# Patient Record
Sex: Male | Born: 1960 | Race: Asian | Hispanic: No | Marital: Married | Smoking: Never smoker
Health system: Southern US, Community
[De-identification: ages and names within clinical notes are randomized; demographics above are authoritative.]

## PROBLEM LIST (undated history)

## (undated) DIAGNOSIS — E785 Hyperlipidemia, unspecified: Secondary | ICD-10-CM

## (undated) DIAGNOSIS — I861 Scrotal varices: Secondary | ICD-10-CM

## (undated) HISTORY — DX: Scrotal varices: I86.1

## (undated) HISTORY — PX: ESOPHAGOGASTRODUODENOSCOPY: SHX1529

## (undated) HISTORY — PX: BACK SURGERY: SHX140

## (undated) HISTORY — DX: Hyperlipidemia, unspecified: E78.5

---

## 2000-02-27 ENCOUNTER — Ambulatory Visit (HOSPITAL_COMMUNITY): Admission: RE | Admit: 2000-02-27 | Discharge: 2000-02-27 | Payer: Self-pay | Admitting: Gastroenterology

## 2000-04-06 ENCOUNTER — Ambulatory Visit (HOSPITAL_COMMUNITY): Admission: RE | Admit: 2000-04-06 | Discharge: 2000-04-06 | Payer: Self-pay | Admitting: Gastroenterology

## 2000-04-06 ENCOUNTER — Encounter: Payer: Self-pay | Admitting: Gastroenterology

## 2000-04-29 ENCOUNTER — Ambulatory Visit (HOSPITAL_COMMUNITY): Admission: RE | Admit: 2000-04-29 | Discharge: 2000-04-29 | Payer: Self-pay | Admitting: Gastroenterology

## 2000-04-29 ENCOUNTER — Encounter: Payer: Self-pay | Admitting: Gastroenterology

## 2003-02-20 ENCOUNTER — Ambulatory Visit (HOSPITAL_COMMUNITY): Admission: RE | Admit: 2003-02-20 | Discharge: 2003-02-20 | Payer: Self-pay | Admitting: Gastroenterology

## 2003-03-01 ENCOUNTER — Ambulatory Visit (HOSPITAL_COMMUNITY): Admission: RE | Admit: 2003-03-01 | Discharge: 2003-03-01 | Payer: Self-pay | Admitting: Pulmonary Disease

## 2003-05-10 ENCOUNTER — Ambulatory Visit: Admission: RE | Admit: 2003-05-10 | Discharge: 2003-05-10 | Payer: Self-pay | Admitting: Pulmonary Disease

## 2003-05-10 ENCOUNTER — Encounter (INDEPENDENT_AMBULATORY_CARE_PROVIDER_SITE_OTHER): Payer: Self-pay | Admitting: Specialist

## 2004-01-16 ENCOUNTER — Ambulatory Visit: Payer: Self-pay | Admitting: Internal Medicine

## 2004-08-26 ENCOUNTER — Ambulatory Visit: Payer: Self-pay | Admitting: Internal Medicine

## 2004-08-26 ENCOUNTER — Ambulatory Visit (HOSPITAL_COMMUNITY): Admission: RE | Admit: 2004-08-26 | Discharge: 2004-08-26 | Payer: Self-pay | Admitting: Internal Medicine

## 2005-08-25 ENCOUNTER — Ambulatory Visit: Payer: Self-pay | Admitting: Internal Medicine

## 2005-09-03 ENCOUNTER — Ambulatory Visit: Payer: Self-pay | Admitting: Internal Medicine

## 2005-09-24 ENCOUNTER — Ambulatory Visit: Payer: Self-pay | Admitting: Gastroenterology

## 2005-09-29 ENCOUNTER — Ambulatory Visit: Payer: Self-pay | Admitting: Gastroenterology

## 2005-11-11 ENCOUNTER — Ambulatory Visit: Payer: Self-pay | Admitting: Internal Medicine

## 2006-12-17 ENCOUNTER — Ambulatory Visit: Payer: Self-pay | Admitting: Internal Medicine

## 2006-12-17 LAB — CONVERTED CEMR LAB
ALT: 24 units/L (ref 0–53)
AST: 20 units/L (ref 0–37)
Albumin: 4.4 g/dL (ref 3.5–5.2)
Alkaline Phosphatase: 52 units/L (ref 39–117)
BUN: 6 mg/dL (ref 6–23)
Basophils Absolute: 0 10*3/uL (ref 0.0–0.1)
Basophils Relative: 0.4 % (ref 0.0–1.0)
Bilirubin, Direct: 0.1 mg/dL (ref 0.0–0.3)
CO2: 32 meq/L (ref 19–32)
Calcium: 9.3 mg/dL (ref 8.4–10.5)
Chloride: 104 meq/L (ref 96–112)
Cholesterol: 202 mg/dL (ref 0–200)
Creatinine, Ser: 0.7 mg/dL (ref 0.4–1.5)
Direct LDL: 118.1 mg/dL
Eosinophils Absolute: 0 10*3/uL (ref 0.0–0.6)
Eosinophils Relative: 0.5 % (ref 0.0–5.0)
GFR calc Af Amer: 156 mL/min
GFR calc non Af Amer: 129 mL/min
Glucose, Bld: 120 mg/dL — ABNORMAL HIGH (ref 70–99)
HCT: 42.2 % (ref 39.0–52.0)
HDL: 62.2 mg/dL (ref >39.0)
Hemoglobin: 14.7 g/dL (ref 13.0–17.0)
Lymphocytes Relative: 28.1 % (ref 12.0–46.0)
MCHC: 34.9 g/dL (ref 30.0–36.0)
MCV: 93.1 fL (ref 78.0–100.0)
Monocytes Absolute: 0.3 10*3/uL (ref 0.2–0.7)
Monocytes Relative: 6.4 % (ref 3.0–11.0)
Neutro Abs: 2.6 10*3/uL (ref 1.4–7.7)
Neutrophils Relative %: 64.6 % (ref 43.0–77.0)
Platelets: 202 10*3/uL (ref 150–400)
Potassium: 4.2 meq/L (ref 3.5–5.1)
RBC: 4.53 M/uL (ref 4.22–5.81)
RDW: 13.2 % (ref 11.5–14.6)
Sodium: 141 meq/L (ref 135–145)
TSH: 1.44 microintl units/mL (ref 0.35–5.50)
Total Bilirubin: 1.1 mg/dL (ref 0.3–1.2)
Total CHOL/HDL Ratio: 3.2
Total Protein: 7 g/dL (ref 6.0–8.3)
Triglycerides: 94 mg/dL (ref 0–149)
VLDL: 19 mg/dL (ref 0–40)
WBC: 4 10*3/uL — ABNORMAL LOW (ref 4.5–10.5)

## 2006-12-24 ENCOUNTER — Ambulatory Visit: Payer: Self-pay | Admitting: Internal Medicine

## 2006-12-24 DIAGNOSIS — K219 Gastro-esophageal reflux disease without esophagitis: Secondary | ICD-10-CM

## 2006-12-24 DIAGNOSIS — D72819 Decreased white blood cell count, unspecified: Secondary | ICD-10-CM | POA: Insufficient documentation

## 2006-12-24 DIAGNOSIS — E785 Hyperlipidemia, unspecified: Secondary | ICD-10-CM

## 2006-12-24 DIAGNOSIS — I861 Scrotal varices: Secondary | ICD-10-CM | POA: Insufficient documentation

## 2006-12-24 DIAGNOSIS — J309 Allergic rhinitis, unspecified: Secondary | ICD-10-CM | POA: Insufficient documentation

## 2007-06-30 ENCOUNTER — Ambulatory Visit: Payer: Self-pay | Admitting: Internal Medicine

## 2007-06-30 DIAGNOSIS — J069 Acute upper respiratory infection, unspecified: Secondary | ICD-10-CM | POA: Insufficient documentation

## 2007-12-01 ENCOUNTER — Encounter: Payer: Self-pay | Admitting: Internal Medicine

## 2007-12-20 ENCOUNTER — Ambulatory Visit: Payer: Self-pay | Admitting: Internal Medicine

## 2007-12-20 LAB — CONVERTED CEMR LAB
ALT: 22 units/L (ref 0–53)
AST: 22 units/L (ref 0–37)
Albumin: 4.3 g/dL (ref 3.5–5.2)
Alkaline Phosphatase: 52 units/L (ref 39–117)
BUN: 9 mg/dL (ref 6–23)
Basophils Absolute: 0 10*3/uL (ref 0.0–0.1)
Basophils Relative: 0 % (ref 0.0–3.0)
Bilirubin Urine: NEGATIVE
Bilirubin, Direct: 0.1 mg/dL (ref 0.0–0.3)
CO2: 28 meq/L (ref 19–32)
CRP, High Sensitivity: <1 — ABNORMAL LOW (ref 0.00–5.00)
Calcium: 9.1 mg/dL (ref 8.4–10.5)
Chloride: 106 meq/L (ref 96–112)
Cholesterol: 258 mg/dL (ref 0–200)
Creatinine, Ser: 0.6 mg/dL (ref 0.4–1.5)
Direct LDL: 149.1 mg/dL
Eosinophils Absolute: 0 10*3/uL (ref 0.0–0.7)
Eosinophils Relative: 1 % (ref 0.0–5.0)
GFR calc Af Amer: 186 mL/min
GFR calc non Af Amer: 153 mL/min
Glucose, Bld: 98 mg/dL (ref 70–99)
HCT: 44.8 % (ref 39.0–52.0)
HDL: 50.1 mg/dL (ref >39.0)
Hemoglobin, Urine: NEGATIVE
Hemoglobin: 15.7 g/dL (ref 13.0–17.0)
Ketones, ur: NEGATIVE mg/dL
Leukocytes, UA: NEGATIVE
Lymphocytes Relative: 34.5 % (ref 12.0–46.0)
MCHC: 34.9 g/dL (ref 30.0–36.0)
MCV: 93.9 fL (ref 78.0–100.0)
Monocytes Absolute: 0.3 10*3/uL (ref 0.1–1.0)
Monocytes Relative: 7.3 % (ref 3.0–12.0)
Neutro Abs: 2.3 10*3/uL (ref 1.4–7.7)
Neutrophils Relative %: 57.2 % (ref 43.0–77.0)
Nitrite: NEGATIVE
Platelets: 196 10*3/uL (ref 150–400)
Potassium: 4 meq/L (ref 3.5–5.1)
RBC: 4.77 M/uL (ref 4.22–5.81)
RDW: 12.9 % (ref 11.5–14.6)
Sodium: 140 meq/L (ref 135–145)
Specific Gravity, Urine: 1.015 (ref 1.000–1.03)
TSH: 2.99 microintl units/mL (ref 0.35–5.50)
Total Bilirubin: 1.1 mg/dL (ref 0.3–1.2)
Total CHOL/HDL Ratio: 5.1
Total CK: 150 units/L (ref 7–195)
Total Protein, Urine: NEGATIVE mg/dL
Total Protein: 7.4 g/dL (ref 6.0–8.3)
Triglycerides: 147 mg/dL (ref 0–149)
Urine Glucose: NEGATIVE mg/dL
Urobilinogen, UA: 0.2 (ref 0.0–1.0)
VLDL: 29 mg/dL (ref 0–40)
WBC: 4 10*3/uL — ABNORMAL LOW (ref 4.5–10.5)
pH: 7 (ref 5.0–8.0)

## 2007-12-27 ENCOUNTER — Ambulatory Visit: Payer: Self-pay | Admitting: Internal Medicine

## 2007-12-27 DIAGNOSIS — R209 Unspecified disturbances of skin sensation: Secondary | ICD-10-CM

## 2010-02-19 ENCOUNTER — Telehealth: Payer: Self-pay | Admitting: Internal Medicine

## 2010-02-20 ENCOUNTER — Encounter: Payer: Self-pay | Admitting: Internal Medicine

## 2010-02-20 LAB — CONVERTED CEMR LAB
BUN: 10 mg/dL (ref 6–23)
Chloride: 102 meq/L (ref 96–112)
Glucose, Bld: 102 mg/dL — ABNORMAL HIGH (ref 70–99)
LDL Cholesterol: 165 mg/dL — ABNORMAL HIGH (ref 0–99)
Potassium: 4.2 meq/L (ref 3.5–5.3)
Triglycerides: 163 mg/dL — ABNORMAL HIGH (ref <150)
VLDL: 33 mg/dL (ref 0–40)

## 2010-02-21 ENCOUNTER — Encounter: Payer: Self-pay | Admitting: Internal Medicine

## 2010-02-21 ENCOUNTER — Ambulatory Visit
Admission: RE | Admit: 2010-02-21 | Discharge: 2010-02-21 | Payer: Self-pay | Source: Home / Self Care | Attending: Internal Medicine | Admitting: Internal Medicine

## 2010-02-24 ENCOUNTER — Telehealth: Payer: Self-pay | Admitting: Internal Medicine

## 2010-03-07 NOTE — Progress Notes (Signed)
Summary: Lab Order  Phone Note Call from Patient   Caller: Patient Details for Reason: Lab order Summary of Call: Pt scheduled for CPX   thursday Jan 19,coming for Lab  tomorrow Jan 18  need orders Initial call taken by: Darral Dash,  February 19, 2010 12:40 PM  Follow-up for Phone Call        FLP, High sensitivity CRP :  272.4 BMET:  272.4  Follow-up by: D. Thomos Lemons DO,  February 19, 2010 1:11 PM  Additional Follow-up for Phone Call Additional follow up Details #1::        Lab order entered for [facility] Additional Follow-up by: Glendell Docker CMA,  February 19, 2010 3:04 PM

## 2010-03-07 NOTE — Progress Notes (Signed)
Summary: Lab Results  Phone Note Outgoing Call   Summary of Call: call pt - H. Pylori antibody negative Initial call taken by: D. Thomos Lemons DO,  February 24, 2010 11:23 PM  Follow-up for Phone Call        call placed to patient at [phone], he has been informed per Dr Artist Pais instructions Follow-up by: Glendell Docker CMA,  February 25, 2010 12:43 PM

## 2010-03-13 NOTE — Assessment & Plan Note (Signed)
Summary: CPX/HEA   Vital Signs:  Patient profile:   50 year old male Height:      67 inches Weight:      152.50 pounds BMI:     23.97 O2 Sat:      100 % on Room air Temp:     97.6 degrees F oral Pulse rate:   56 / minute Resp:     18 per minute BP sitting:   110 / 70  (left arm) Cuff size:   regular  Vitals Entered By: Glendell Docker CMA (February 21, 2010 8:34 AM)  O2 Flow:  Room air  Primary Care Provider:  D. Thomos Lemons DO   History of Present Illness: 50 y/o [place] male for follow up and for routine cpx no sign int hx overall doing well  still worries about fam hx of pancreatic cancer no GI symptoms   Preventive Screening-Counseling & Management  Alcohol-Tobacco     Alcohol drinks/day: 0     Smoking Status: never  Caffeine-Diet-Exercise     Caffeine use/day: 2-3 times per week     Does Patient Exercise: no  Allergies (verified): No Known Drug Allergies  Past History:  Past Medical History: Hyperlipidemia Allergic rhinitis History of Left Varicocele  Bronchiectasis    Past Surgical History: Back surgery EGD - 2002, 2007    Family History: Family history of gastric cancer and pancreatic cancer.  family hx of stroke family hx of rectal cancer - Aunt   Social History: Occupation: Self employed Married Never Smoked  Alcohol use-no   Caffeine use/day:  2-3 times per week Does Patient Exercise:  no  Review of Systems  The patient denies anorexia, weight loss, weight gain, chest pain, syncope, dyspnea on exertion, abdominal pain, melena, hematochezia, severe indigestion/heartburn, and depression.         intermittent right sided neck pain  Physical Exam  General:  alert, well-developed, and well-nourished.   Head:  normocephalic and atraumatic.   Ears:  R ear normal and L ear normal.   Mouth:  pharynx pink and moist.   Neck:  No deformities, masses, or tenderness noted. Lungs:  normal respiratory effort and normal breath sounds.   Heart:   normal rate, regular rhythm, and no gallop.   Abdomen:  soft, non-tender, and normal bowel sounds.   Extremities:  No lower extremity edema Neurologic:  cranial nerves II-XII intact and gait normal.   Psych:  normally interactive, good eye contact, not anxious appearing, and not depressed appearing.     Impression & Recommendations:  Problem # 1:  ROUTINE GENERAL MEDICAL EXAM@HEALTH  CARE FACL (ICD-V70.0) Reviewed adult health maintenance protocols.  Orders: EKG w/ Interpretation (93000)  Td Booster: given (09/25/2006)   Flu Vax: Historical (11/13/2009)   Chol: 258 (12/20/2007)   HDL: 50.1 (12/20/2007)   LDL: DEL (12/20/2007)   TG: 147 (12/20/2007) TSH: 2.99 (12/20/2007)     Problem # 2:  HYPERLIPIDEMIA (ICD-272.4)  His updated medication list for this problem includes:    Lipitor 10 Mg Tabs (Atorvastatin calcium) ..... One by mouth once daily  Labs Reviewed: SGOT: 22 (12/20/2007)   SGPT: 22 (12/20/2007)   HDL:50.1 (12/20/2007), 62.2 (12/17/2006)  LDL:DEL (12/20/2007), DEL (12/17/2006)  Chol:258 (12/20/2007), 202 (12/17/2006)  Trig:147 (12/20/2007), 94 (12/17/2006)  Complete Medication List: 1)  Lipitor 10 Mg Tabs (Atorvastatin calcium) .... One by mouth once daily  Patient Instructions: 1)  Please schedule a follow-up appointment in 3 months. 2)  Hepatic Panel prior to visit, ICD-9:  272.4 3)  Lipid Panel prior to visit, ICD-9: 272.4 4)  Please return for lab work one (1) week before your next appointment.  Prescriptions: LIPITOR 10 MG TABS (ATORVASTATIN CALCIUM) one by mouth once daily  #90 x 1   Entered and Authorized by:   D. Thomos Lemons DO   Signed by:   D. Thomos Lemons DO on 02/21/2010   Method used:   Faxed to ...       [facility] (mail-order)       [id number]       [place], [place]  [id number]       Ph:        Fax: [phone]   RxID:   [id number]    Orders Added: 1)  EKG w/ Interpretation [93000] 2)  Est. Patient 50-64 years [96295]   Immunization  History:  Influenza Immunization History:    Influenza:  historical (11/13/2009)   Immunization History:  Influenza Immunization History:    Influenza:  Historical (11/13/2009)   Current Allergies (reviewed today): No known allergies

## 2010-05-14 ENCOUNTER — Other Ambulatory Visit: Payer: Self-pay | Admitting: Internal Medicine

## 2010-05-14 DIAGNOSIS — E785 Hyperlipidemia, unspecified: Secondary | ICD-10-CM

## 2010-05-14 LAB — LIPID PANEL
Cholesterol: 173 mg/dL (ref 0–200)
LDL Cholesterol: 93 mg/dL (ref 0–99)
VLDL: 33 mg/dL (ref 0–40)

## 2010-05-15 ENCOUNTER — Encounter: Payer: Self-pay | Admitting: Internal Medicine

## 2010-05-21 ENCOUNTER — Ambulatory Visit (INDEPENDENT_AMBULATORY_CARE_PROVIDER_SITE_OTHER): Payer: BC Managed Care – PPO | Admitting: Internal Medicine

## 2010-05-21 ENCOUNTER — Encounter: Payer: Self-pay | Admitting: Internal Medicine

## 2010-05-21 VITALS — BP 90/60 | HR 49 | Temp 97.7°F | Resp 16 | Ht 67.0 in | Wt 150.0 lb

## 2010-05-21 DIAGNOSIS — E785 Hyperlipidemia, unspecified: Secondary | ICD-10-CM

## 2010-05-21 LAB — HEPATIC FUNCTION PANEL
Albumin: 4.5 g/dL (ref 3.5–5.2)
Total Bilirubin: 0.5 mg/dL (ref 0.3–1.2)
Total Protein: 6.5 g/dL (ref 6.0–8.3)

## 2010-05-21 MED ORDER — ATORVASTATIN CALCIUM 10 MG PO TABS: 10.0000 mg | ORAL_TABLET | Freq: Every day | ORAL | Status: DC

## 2010-05-21 NOTE — Progress Notes (Signed)
  Subjective:    Patient ID: Cristian Fuller, male    DOB: 1960-10-15, 50 y.o.   MRN: [id number]  Hyperlipidemia This is a chronic problem. The problem is controlled. Recent lipid tests were reviewed and are normal. He has no history of diabetes, hypothyroidism or obesity. Pertinent negatives include no chest pain. Current antihyperlipidemic treatment includes statins. The current treatment provides moderate improvement of lipids. There are no compliance problems.  Risk factors for coronary artery disease include male sex.      Review of Systems  Cardiovascular: Negative for chest pain.      Review of Systems  Cardiovascular: Negative for chest pain.   Past Medical History  Diagnosis Date  . Hyperlipidemia   . Allergic rhinitis   . Left varicocele     history of left varicocele  . Bronchiectasis     History   Social History  . Marital Status: Married    Spouse Name: N/A    Number of Children: N/A  . Years of Education: N/A   Occupational History  . Not on file.   Social History Main Topics  . Smoking status: Never Smoker   . Smokeless tobacco: Not on file  . Alcohol Use: No  . Drug Use: Not on file  . Sexually Active: Not on file   Other Topics Concern  . Not on file   Social History Narrative   Occupation: Office manager Smoked Alcohol use-no  Caffeine use/day:  2-3 times per weekDoes Patient Exercise:  no    Past Surgical History  Procedure Date  . Back surgery   . Esophagogastroduodenoscopy 2002/2007    Family History  Problem Relation Age of Onset  . Pancreatic cancer    . Cancer      gastric cancer  . Stroke    . Rectal cancer      aunt    No Known Allergies  Current Outpatient Prescriptions on File Prior to Visit  Medication Sig Dispense Refill  . atorvastatin (LIPITOR) 10 MG tablet Take 10 mg by mouth daily.          BP 90/60  Pulse 49  Temp(Src) 97.7 F (36.5 C) (Oral)  Resp 16  Ht 5\' 7"  (1.702 m)  Wt 150 lb (68.04 kg)   BMI 23.49 kg/m2  SpO2 100%     Objective:   Physical Exam  Constitutional: He appears well-developed and well-nourished. No distress.  Neck:       No carotid bruit  Cardiovascular: Normal rate, regular rhythm and normal heart sounds.   Pulmonary/Chest: Effort normal and breath sounds normal. He has no wheezes. He has no rales.          Assessment & Plan:

## 2010-05-26 ENCOUNTER — Encounter: Payer: Self-pay | Admitting: Internal Medicine

## 2010-05-26 NOTE — Assessment & Plan Note (Signed)
Improved with atorvastatin.  Continue current medication regimen.   Lab Results  Component Value Date   CHOL 173 05/14/2010   CHOL 248* 02/20/2010   CHOL 258* 12/20/2007   Lab Results  Component Value Date   HDL 47 05/14/2010   HDL 50 02/22/1476   HDL 50.1 12/20/2007   Lab Results  Component Value Date   LDLCALC 93 05/14/2010   LDLCALC 165* 02/20/2010   Lab Results  Component Value Date   TRIG 166* 05/14/2010   TRIG 163* 02/20/2010   TRIG 147 12/20/2007   Lab Results  Component Value Date   CHOLHDL 3.7 05/14/2010   CHOLHDL 5.0 Ratio 02/20/2010   CHOLHDL 5.1 CALC 12/20/2007

## 2010-05-26 NOTE — Patient Instructions (Signed)
Please complete the following lab tests before your next follow up appointment: FLP, LFTs - 272.4 

## 2010-06-21 NOTE — Assessment & Plan Note (Signed)
[facility]                           GASTROENTEROLOGY OFFICE NOTE   NAME:Masi, ERIQUE KASER                        MRN:          [id number]  DATE:09/24/2005                            DOB:          17-Dec-1960    PREVIOUS GASTROENTEROLOGIST:  Dr. Victorino Dike   GI PROBLEM LIST:  1. Gastroesophageal reflux disease symptoms. Status post EGD in 2002 by      Dr. Victorino Dike which described esophagitis, likely reflux related.  2. Strong family history for stomach cancer including father, an uncle and      a nephew.   INTERVAL HISTORY:  Mr. Uhrich was last seen here by Dr. Corinda Gubler approximately  2 years ago. Since then he continues to have intermittent burning in his  esophagus when eating. He is not taking any PPIs as was suggested in the  past. He is also very concerned about his strong family history for gastric  cancer.   REVIEW OF SYSTEMS:  Notable for stable weight, no dysphagia, and  intermittent pyrosis.   CURRENT MEDICATIONS:  Zetia, fluticasone.   PHYSICAL EXAMINATION:  VITAL SIGNS:  Height 5 feet, 7 inches, weight 140  pounds. Blood pressure 102/58, pulse 56.  CONSTITUTIONAL:  Generally well-appearing.  NEUROLOGIC:  Alert and oriented x3.  LUNGS:  Clear to auscultation bilaterally.  HEART:  Regular rate and rhythm.  ABDOMEN:  Soft, nontender, nondistended. Normal bowel sounds.   ASSESSMENT AND PLAN:  50 year old man with strong family history for gastric  cancer, intermittent GERD symptoms, known esophagitis.   I think we should repeat endoscopy given his very strong family history for  gastric cancer. We will arrange for that to be done as soon as convenient.  I reiterated the fact that he probably should be on a daily proton pump  inhibitor and wrote a prescription for Protonix to be taken 40 mg 20 to 30  minutes prior to his breakfast meal.  I see no reason for any further blood  tests or imaging studies prior to then.                    Rachael Fee, MD   DPJ/MedQ  DD:  09/24/2005  DT:  09/24/2005  Job #:  [id number]   cc:   Barbette Hair. Artist Pais, DO

## 2010-10-26 ENCOUNTER — Other Ambulatory Visit: Payer: Self-pay | Admitting: Internal Medicine

## 2010-11-18 ENCOUNTER — Telehealth: Payer: Self-pay | Admitting: Internal Medicine

## 2010-11-18 NOTE — Telephone Encounter (Signed)
Schedule cpx labs 1 wk prior

## 2010-11-18 NOTE — Telephone Encounter (Signed)
Patient has cpe with Dr. Artist Pais on 05/21/11. Please order labs prior to visit.

## 2011-03-18 ENCOUNTER — Telehealth: Payer: Self-pay | Admitting: Internal Medicine

## 2011-03-18 DIAGNOSIS — Z0000 Encounter for general adult medical examination without abnormal findings: Secondary | ICD-10-CM

## 2011-03-18 NOTE — Telephone Encounter (Signed)
Pt called and has sch his cpx for 04/07/11 and is req to get a colonoscopy ordered and sched prior to his cpx, because pts insurance is getting ready to Terminate. Pt is losing his insurance in next month or so. Pls advise.

## 2011-03-19 NOTE — Telephone Encounter (Signed)
Order placed for colonoscopy

## 2011-03-19 NOTE — Telephone Encounter (Signed)
pls place order for screening colonoscopy

## 2011-03-24 ENCOUNTER — Other Ambulatory Visit (INDEPENDENT_AMBULATORY_CARE_PROVIDER_SITE_OTHER): Payer: BC Managed Care – PPO

## 2011-03-24 DIAGNOSIS — E785 Hyperlipidemia, unspecified: Secondary | ICD-10-CM

## 2011-03-24 DIAGNOSIS — Z0000 Encounter for general adult medical examination without abnormal findings: Secondary | ICD-10-CM

## 2011-03-24 LAB — CBC WITH DIFFERENTIAL/PLATELET
Basophils Absolute: 0 K/uL (ref 0.0–0.1)
Basophils Relative: 0.7 % (ref 0.0–3.0)
Eosinophils Absolute: 0.1 K/uL (ref 0.0–0.7)
Eosinophils Relative: 1.4 % (ref 0.0–5.0)
HCT: 44.6 % (ref 39.0–52.0)
Hemoglobin: 15.2 g/dL (ref 13.0–17.0)
Lymphocytes Relative: 31.8 % (ref 12.0–46.0)
Lymphs Abs: 1.2 K/uL (ref 0.7–4.0)
MCHC: 34.2 g/dL (ref 30.0–36.0)
MCV: 91.9 fl (ref 78.0–100.0)
Monocytes Absolute: 0.4 K/uL (ref 0.1–1.0)
Monocytes Relative: 11.4 % (ref 3.0–12.0)
Neutro Abs: 2.1 K/uL (ref 1.4–7.7)
Neutrophils Relative %: 54.7 % (ref 43.0–77.0)
Platelets: 193 K/uL (ref 150.0–400.0)
RBC: 4.85 Mil/uL (ref 4.22–5.81)
RDW: 13 % (ref 11.5–14.6)
WBC: 3.8 K/uL — ABNORMAL LOW (ref 4.5–10.5)

## 2011-03-24 LAB — POCT URINALYSIS DIPSTICK
Bilirubin, UA: NEGATIVE
Glucose, UA: NEGATIVE
Leukocytes, UA: NEGATIVE
Nitrite, UA: NEGATIVE
Urobilinogen, UA: 0.2
pH, UA: 7

## 2011-03-24 LAB — LIPID PANEL
Cholesterol: 328 mg/dL — ABNORMAL HIGH (ref 0–200)
HDL: 49.8 mg/dL
Total CHOL/HDL Ratio: 7
Triglycerides: 147 mg/dL (ref 0.0–149.0)
VLDL: 29.4 mg/dL (ref 0.0–40.0)

## 2011-03-24 LAB — BASIC METABOLIC PANEL WITH GFR
BUN: 13 mg/dL (ref 6–23)
CO2: 27 meq/L (ref 19–32)
Calcium: 9.1 mg/dL (ref 8.4–10.5)
Chloride: 104 meq/L (ref 96–112)
Creatinine, Ser: 0.7 mg/dL (ref 0.4–1.5)
GFR: 118.53 mL/min
Glucose, Bld: 99 mg/dL (ref 70–99)
Potassium: 4 meq/L (ref 3.5–5.1)
Sodium: 139 meq/L (ref 135–145)

## 2011-03-24 LAB — TSH: TSH: 2.64 u[IU]/mL (ref 0.35–5.50)

## 2011-03-24 LAB — PSA: PSA: 1.08 ng/mL (ref 0.10–4.00)

## 2011-03-24 LAB — LDL CHOLESTEROL, DIRECT: Direct LDL: 230.3 mg/dL

## 2011-03-24 LAB — HEPATIC FUNCTION PANEL
Albumin: 4.4 g/dL (ref 3.5–5.2)
Alkaline Phosphatase: 51 U/L (ref 39–117)

## 2011-04-07 ENCOUNTER — Ambulatory Visit (INDEPENDENT_AMBULATORY_CARE_PROVIDER_SITE_OTHER): Payer: BC Managed Care – PPO | Admitting: Internal Medicine

## 2011-04-07 ENCOUNTER — Encounter: Payer: Self-pay | Admitting: Internal Medicine

## 2011-04-07 DIAGNOSIS — Z800 Family history of malignant neoplasm of digestive organs: Secondary | ICD-10-CM

## 2011-04-07 DIAGNOSIS — E785 Hyperlipidemia, unspecified: Secondary | ICD-10-CM

## 2011-04-07 DIAGNOSIS — Z0000 Encounter for general adult medical examination without abnormal findings: Secondary | ICD-10-CM

## 2011-04-07 MED ORDER — SIMVASTATIN 40 MG PO TABS: 40.0000 mg | ORAL_TABLET | Freq: Every day | ORAL | Status: DC

## 2011-04-07 NOTE — Patient Instructions (Signed)
Please complete the following lab tests within next 2 months. Fasting lipid panel, LFTs - 272.4

## 2011-04-07 NOTE — Assessment & Plan Note (Signed)
Poor medication compliance.  LDL >230.  Switch to simvastatin 40 mg daily due to cost reasons.  Arrange f/u labs in 2 months.

## 2011-04-07 NOTE — Progress Notes (Signed)
Subjective:    Patient ID: Cristian Fuller, male    DOB: 11-07-1960, 51 y.o.   MRN: [id number]  HPI  52 year old [place] male with history of GERD and hyperlipidemia for routine physical. He denies significant interval medical history. Unfortunately the patient ran for Lipitor several months ago.  Despite eating a fairly low saturated fat diet, patient's LDL is significantly elevated.  Patient worried that he is going to lose his job within the next several months.  He is due for colon cancer screening. He still worries about his strong family history of stomach cancer. Fortunately he has not been having any symptoms of dysphasia or significant weight change.  He does complain of intermittent dyspepsia.  Lab results reviewed in detail with patient.   Review of Systems   Constitutional: Negative for activity change, appetite change and unexpected weight change.  Eyes: Negative for visual disturbance.  Respiratory: Negative for cough, chest tightness and shortness of breath.   Cardiovascular: Negative for chest pain.  Genitourinary: Negative for difficulty urinating.  Neurological: Negative for headaches.  Gastrointestinal: occasional dyspepsia Musculoskeletal:  Intermittent right-sided neck/upper thoracic pain, no right upper extremity weakness      Past Medical History  Diagnosis Date  . Hyperlipidemia   . Allergic rhinitis   . Left varicocele     history of left varicocele    History   Social History  . Marital Status: Married    Spouse Name: N/A    Number of Children: N/A  . Years of Education: N/A   Occupational History  . Not on file.   Social History Main Topics  . Smoking status: Never Smoker   . Smokeless tobacco: Not on file  . Alcohol Use: No  . Drug Use: No  . Sexually Active: Not on file   Other Topics Concern  . Not on file   Social History Narrative   Occupation: Office manager Smoked Alcohol use-no  Caffeine use/day:  2-3 times per  weekDoes Patient Exercise:  no    Past Surgical History  Procedure Date  . Back surgery   . Esophagogastroduodenoscopy 2002/2007    Family History  Problem Relation Age of Onset  . Pancreatic cancer    . Cancer      gastric cancer  . Stroke    . Rectal cancer      aunt    No Known Allergies  No current outpatient prescriptions on file prior to visit.    BP 98/70  Pulse 68  Temp(Src) 98.1 F (36.7 C) (Oral)  Ht 5\' 6"  (1.676 m)  Wt 156 lb (70.761 kg)  BMI 25.18 kg/m2       Objective:   Physical Exam  Constitutional: He is oriented to person, place, and time. He appears well-developed and well-nourished. No distress.  HENT:  Head: Atraumatic.  Right Ear: External ear normal.  Left Ear: External ear normal.  Mouth/Throat: Oropharynx is clear and moist.  Eyes: Conjunctivae are normal. Pupils are equal, round, and reactive to light.  Neck: Normal range of motion. Neck supple.  Cardiovascular: Normal rate, regular rhythm and normal heart sounds.   Pulmonary/Chest: Effort normal and breath sounds normal. He has no wheezes. He has no rales.  Abdominal: Soft. Bowel sounds are normal. He exhibits no mass. There is no tenderness.  Genitourinary: Rectum normal, prostate normal and penis normal. Guaiac negative stool.  Musculoskeletal: Normal range of motion.  Lymphadenopathy:    He has no cervical adenopathy.  Neurological: He is alert and  oriented to person, place, and time. He displays normal reflexes. No cranial nerve deficit. He exhibits normal muscle tone.  Skin: Skin is warm and dry.  Psychiatric: He has a normal mood and affect. His behavior is normal.       Assessment & Plan:

## 2011-04-07 NOTE — Assessment & Plan Note (Signed)
Reviewed adult health maintenance protocols. Refer for screening colonoscopy.  Patient requests EGD as well considering strong family hx of stomach cancer.  Defer decision to GI.    Patient strongly encouraged to restart statin therapy.

## 2011-04-09 ENCOUNTER — Ambulatory Visit (AMBULATORY_SURGERY_CENTER): Payer: BC Managed Care – PPO

## 2011-04-09 ENCOUNTER — Encounter: Payer: Self-pay | Admitting: Gastroenterology

## 2011-04-09 VITALS — Ht 66.0 in | Wt 155.0 lb

## 2011-04-09 DIAGNOSIS — Z1211 Encounter for screening for malignant neoplasm of colon: Secondary | ICD-10-CM

## 2011-04-09 MED ORDER — PEG-KCL-NACL-NASULF-NA ASC-C 100 G PO SOLR: 1.0000 | Freq: Once | ORAL | Status: DC

## 2011-04-16 ENCOUNTER — Ambulatory Visit (AMBULATORY_SURGERY_CENTER): Payer: BC Managed Care – PPO | Admitting: Gastroenterology

## 2011-04-16 ENCOUNTER — Encounter: Payer: Self-pay | Admitting: Gastroenterology

## 2011-04-16 VITALS — BP 118/85 | HR 60 | Temp 96.6°F | Resp 19 | Ht 66.0 in | Wt 155.0 lb

## 2011-04-16 DIAGNOSIS — D126 Benign neoplasm of colon, unspecified: Secondary | ICD-10-CM

## 2011-04-16 DIAGNOSIS — Z1211 Encounter for screening for malignant neoplasm of colon: Secondary | ICD-10-CM

## 2011-04-16 DIAGNOSIS — Z800 Family history of malignant neoplasm of digestive organs: Secondary | ICD-10-CM

## 2011-04-16 MED ORDER — SODIUM CHLORIDE 0.9 % IV SOLN: 500.0000 mL | INTRAVENOUS | Status: DC

## 2011-04-16 NOTE — Patient Instructions (Signed)
YOU HAD AN ENDOSCOPIC PROCEDURE TODAY AT THE [facility]: Refer to the procedure report that was given to you for any specific questions about what was found during the examination.  If the procedure report does not answer your questions, please call your gastroenterologist to clarify.  If you requested that your care partner not be given the details of your procedure findings, then the procedure report has been included in a sealed envelope for you to review at your convenience later.  YOU SHOULD EXPECT: Some feelings of bloating in the abdomen. Passage of more gas than usual.  Walking can help get rid of the air that was put into your GI tract during the procedure and reduce the bloating. If you had a lower endoscopy (such as a colonoscopy or flexible sigmoidoscopy) you may notice spotting of blood in your stool or on the toilet paper. If you underwent a bowel prep for your procedure, then you may not have a normal bowel movement for a few days.  DIET: Your first meal following the procedure should be a light meal and then it is ok to progress to your normal diet.  A half-sandwich or bowl of soup is an example of a good first meal.  Heavy or fried foods are harder to digest and may make you feel nauseous or bloated.  Likewise meals heavy in dairy and vegetables can cause extra gas to form and this can also increase the bloating.  Drink plenty of fluids but you should avoid alcoholic beverages for 24 hours.  ACTIVITY: Your care partner should take you home directly after the procedure.  You should plan to take it easy, moving slowly for the rest of the day.  You can resume normal activity the day after the procedure however you should NOT DRIVE or use heavy machinery for 24 hours (because of the sedation medicines used during the test).    SYMPTOMS TO REPORT IMMEDIATELY: A gastroenterologist can be reached at any hour.  During normal business hours, 8:30 AM to 5:00 PM Monday through Friday,  call [phone].  After hours and on weekends, please call the GI answering service at [phone] who will take a message and have the physician on call contact you.   Following lower endoscopy (colonoscopy or flexible sigmoidoscopy):  Excessive amounts of blood in the stool  Significant tenderness or worsening of abdominal pains  Swelling of the abdomen that is new, acute  Fever of 100F or higher    FOLLOW UP: If any biopsies were taken you will be contacted by phone or by letter within the next 1-3 weeks.  Call your gastroenterologist if you have not heard about the biopsies in 3 weeks.  Our staff will call the home number listed on your records the next business day following your procedure to check on you and address any questions or concerns that you may have at that time regarding the information given to you following your procedure. This is a courtesy call and so if there is no answer at the home number and we have not heard from you through the emergency physician on call, we will assume that you have returned to your regular daily activities without incident.  SIGNATURES/CONFIDENTIALITY: You and/or your care partner have signed paperwork which will be entered into your electronic medical record.  These signatures attest to the fact that that the information above on your After Visit Summary has been reviewed and is understood.  Full responsibility of the confidentiality   of this discharge information lies with you and/or your care-partner.     

## 2011-04-16 NOTE — Progress Notes (Signed)
ALL MEDS TITRATED PER DR JACOBS. EWM  PT TOLERATED PROCEDURE WELL. EWM 

## 2011-04-16 NOTE — Op Note (Signed)
[facility] [address] [place], [place]  [address]  COLONOSCOPY PROCEDURE REPORT  PATIENT:  Cristian Fuller, Cristian Fuller  MR#:  [id number] BIRTHDATE:  Feb 03, 1961, 51 yrs. old  GENDER:  male ENDOSCOPIST:  Rachael Fee, MD REF. BY:  Thomos Lemons, DO PROCEDURE DATE:  04/16/2011 PROCEDURE:  Colonoscopy with biopsy ASA CLASS:  Class II INDICATIONS:  Routine Risk Screening, family history of stomach cancer and one second degree relative with colorectal cancer MEDICATIONS:   Fentanyl 25 mcg IV, These medications were titrated to patient response per physician's verbal order, Versed 4 mg IV  DESCRIPTION OF PROCEDURE:   After the risks benefits and alternatives of the procedure were thoroughly explained, informed consent was obtained.  Digital rectal exam was performed and revealed no rectal masses.   The LB [id number] endoscope was introduced through the anus and advanced to the cecum, which was identified by both the appendix and ileocecal valve, without limitations.  The quality of the prep was good..  The instrument was then slowly withdrawn as the colon was fully examined. <<PROCEDUREIMAGES>> FINDINGS:  A diminutive polyp was found in the mid transverse colon. This was removed with forceps and sent to pathology (jar 1) (see image4).  This was otherwise a normal examination of the colon (see image1, image2, and image5).   Retroflexed views in the rectum revealed no abnormalities. COMPLICATIONS:  None  ENDOSCOPIC IMPRESSION: 1) Diminutive polyp in the mid transverse colon, removed and sent to pathology 2) Otherwise normal examination  RECOMMENDATIONS: 1) If the polyp removed today is proven to be an adenomatous (pre-cancerous) polyp, you will need a repeat colonoscopy in 5 years. Otherwise you should continue to follow colorectal cancer screening guidelines for "routine risk" patients with colonoscopy in 10 years. You will receive a letter within 1-2 weeks with the results  of your biopsy as well as final recommendations. Please call my office if you have not received a letter after 3 weeks.  ______________________________ Rachael Fee, MD  n. eSIGNED:   Rachael Fee at 04/16/2011 11:07 AM  Caesar Bookman, [phone]

## 2011-04-16 NOTE — Progress Notes (Signed)
Patient did not experience any of the following events: a burn prior to discharge; a fall within the facility; wrong site/side/patient/procedure/implant event; or a hospital transfer or hospital admission upon discharge from the facility. (G8907) Patient did not have preoperative order for IV antibiotic SSI prophylaxis. (G8918)  

## 2011-04-17 ENCOUNTER — Telehealth: Payer: Self-pay | Admitting: Internal Medicine

## 2011-04-17 ENCOUNTER — Telehealth: Payer: Self-pay | Admitting: *Deleted

## 2011-04-17 NOTE — Telephone Encounter (Signed)
  Follow up Call-  Call back number 04/16/2011  Post procedure Call Back phone  # [phone]  Permission to leave phone message No     Patient questions:  No answer.

## 2011-04-17 NOTE — Telephone Encounter (Signed)
Patient stated he is have back and leg muscle pain due to cholesterol med. Patient would like a call back from the MD. Please assist.

## 2011-04-18 NOTE — Telephone Encounter (Signed)
No answer, no machine.

## 2011-04-18 NOTE — Telephone Encounter (Signed)
Call pt - have him stop taking simvastatin.  Call in pravastatin 40 mg once daily   # 90 with 3 refills.  He should take new cholesterol 2 weeks after he starts feeling better with stopping simvastatin.

## 2011-04-22 ENCOUNTER — Encounter: Payer: Self-pay | Admitting: Gastroenterology

## 2011-04-28 ENCOUNTER — Telehealth: Payer: Self-pay | Admitting: Internal Medicine

## 2011-04-28 NOTE — Telephone Encounter (Signed)
Patient called stating that he has had muscle pain due to taking the cholesterol meds. Patient states he stopped taking it for 2 weeks and the pain stopped and would like advise on what he can take. Please call patient with advise per patient request.

## 2011-04-30 ENCOUNTER — Other Ambulatory Visit: Payer: Self-pay | Admitting: Internal Medicine

## 2011-04-30 MED ORDER — PRAVASTATIN SODIUM 40 MG PO TABS: 40.0000 mg | ORAL_TABLET | Freq: Every day | ORAL | Status: AC

## 2011-04-30 NOTE — Telephone Encounter (Signed)
Addended by: Beverely Low on: 04/30/2011 04:17 PM   Modules accepted: Orders

## 2011-04-30 NOTE — Telephone Encounter (Signed)
Pt called back and said that he originally had called on Monday 04/28/11 re: muscle pain, due to cholesterol med. Pt said that he was expecting a call back and no one has called him yet. Pt would like a call back today asap. Pt stopped taking the med and needs to know what he can take. Pls call.

## 2011-04-30 NOTE — Telephone Encounter (Signed)
I suggest he switch to pravastatin 40 mg -  Take one tab q pm  # 90 with 3 refills.  He should return in 3 months for labs:  LFTs, lipid panel, CPK - 272.4  He should call office if muscle pains return on new cholesterol medication

## 2011-04-30 NOTE — Telephone Encounter (Signed)
Pt is aware of switch pravastatin and will make Dr. Artist Pais aware if sxs do not improve. Rx sent to pharmacy

## 2011-05-08 ENCOUNTER — Other Ambulatory Visit: Payer: BC Managed Care – PPO

## 2011-05-14 ENCOUNTER — Other Ambulatory Visit: Payer: BC Managed Care – PPO

## 2011-05-21 ENCOUNTER — Ambulatory Visit: Payer: BC Managed Care – PPO | Admitting: Internal Medicine

## 2011-05-21 ENCOUNTER — Encounter: Payer: BC Managed Care – PPO | Admitting: Internal Medicine

## 2011-05-31 ENCOUNTER — Other Ambulatory Visit: Payer: Self-pay | Admitting: Internal Medicine

## 2011-08-13 ENCOUNTER — Other Ambulatory Visit: Payer: Self-pay | Admitting: Internal Medicine

## 2012-10-17 ENCOUNTER — Other Ambulatory Visit: Payer: Self-pay

## 2012-10-17 ENCOUNTER — Emergency Department (HOSPITAL_COMMUNITY): Payer: Medicaid Other

## 2012-10-17 ENCOUNTER — Inpatient Hospital Stay (HOSPITAL_COMMUNITY)
Admission: EM | Admit: 2012-10-17 | Discharge: 2012-10-18 | DRG: 190 | Disposition: A | Discharge status: Home / Self Care | Payer: Medicaid Other | Attending: Family Medicine | Admitting: Family Medicine

## 2012-10-17 DIAGNOSIS — E785 Hyperlipidemia, unspecified: Secondary | ICD-10-CM

## 2012-10-17 DIAGNOSIS — R209 Unspecified disturbances of skin sensation: Secondary | ICD-10-CM

## 2012-10-17 DIAGNOSIS — K219 Gastro-esophageal reflux disease without esophagitis: Secondary | ICD-10-CM | POA: Diagnosis present

## 2012-10-17 DIAGNOSIS — Z0000 Encounter for general adult medical examination without abnormal findings: Secondary | ICD-10-CM

## 2012-10-17 DIAGNOSIS — J309 Allergic rhinitis, unspecified: Secondary | ICD-10-CM

## 2012-10-17 DIAGNOSIS — I2699 Other pulmonary embolism without acute cor pulmonale: Secondary | ICD-10-CM

## 2012-10-17 DIAGNOSIS — R042 Hemoptysis: Secondary | ICD-10-CM

## 2012-10-17 DIAGNOSIS — J479 Bronchiectasis, uncomplicated: Principal | ICD-10-CM | POA: Diagnosis present

## 2012-10-17 DIAGNOSIS — I498 Other specified cardiac arrhythmias: Secondary | ICD-10-CM | POA: Diagnosis present

## 2012-10-17 LAB — COMPREHENSIVE METABOLIC PANEL
ALT: 33 U/L (ref 0–53)
Calcium: 9.1 mg/dL (ref 8.4–10.5)
Creatinine, Ser: 0.73 mg/dL (ref 0.50–1.35)
GFR calc Af Amer: >90 mL/min (ref >90)
GFR calc non Af Amer: >90 mL/min (ref >90)
Glucose, Bld: 104 mg/dL — ABNORMAL HIGH (ref 70–99)
Sodium: 135 mEq/L (ref 135–145)
Total Protein: 7.1 g/dL (ref 6.0–8.3)

## 2012-10-17 LAB — CBC WITH DIFFERENTIAL/PLATELET
Eosinophils Absolute: 0.1 10*3/uL (ref 0.0–0.7)
Eosinophils Relative: 1 % (ref 0–5)
HCT: 42.2 % (ref 39.0–52.0)
Lymphs Abs: 2.4 10*3/uL (ref 0.7–4.0)
MCH: 30.9 pg (ref 26.0–34.0)
MCV: 90.6 fL (ref 78.0–100.0)
Monocytes Absolute: 0.3 10*3/uL (ref 0.1–1.0)
Platelets: 197 10*3/uL (ref 150–400)
RBC: 4.66 MIL/uL (ref 4.22–5.81)
RDW: 12.9 % (ref 11.5–15.5)

## 2012-10-17 LAB — URINALYSIS, ROUTINE W REFLEX MICROSCOPIC
Glucose, UA: NEGATIVE mg/dL
Ketones, ur: NEGATIVE mg/dL
Leukocytes, UA: NEGATIVE
Nitrite: NEGATIVE
Protein, ur: NEGATIVE mg/dL
Urobilinogen, UA: 0.2 mg/dL (ref 0.0–1.0)

## 2012-10-17 LAB — PROTIME-INR
INR: 0.87 (ref 0.00–1.49)
Prothrombin Time: 11.7 seconds (ref 11.6–15.2)

## 2012-10-17 LAB — TYPE AND SCREEN: ABO/RH(D): B POS

## 2012-10-17 MED ORDER — IOHEXOL 350 MG/ML SOLN
100.0000 mL | Freq: Once | INTRAVENOUS | Status: AC | PRN
  Administered 2012-10-17: 100 mL via INTRAVENOUS

## 2012-10-17 MED ORDER — SODIUM CHLORIDE 0.9 % IV BOLUS (SEPSIS)
1000.0000 mL | Freq: Once | INTRAVENOUS | Status: AC
  Administered 2012-10-17: 1000 mL via INTRAVENOUS

## 2012-10-17 NOTE — ED Notes (Signed)
Patient complain of discomfort at IV site to R forearm. IV site DC'd. Patient states L hand IV is ok, no complaints.

## 2012-10-17 NOTE — ED Provider Notes (Signed)
CSN: [id number]     Arrival date & time 10/17/12  2106 History   First MD Initiated Contact with Patient 10/17/12 2112     Chief Complaint  Patient presents with  . Hemoptysis   (Consider location/radiation/quality/duration/timing/severity/associated sxs/prior Treatment) Patient is a 52 y.o. male presenting with cough.  Cough Cough characteristics: Hemoptysis. Severity:  Moderate Onset quality:  Sudden Duration:  1 hour Timing: frequent. Progression:  Unchanged Chronicity:  New Smoker: no   Context: not sick contacts and not upper respiratory infection   Relieved by:  Nothing Worsened by:  Nothing tried Associated symptoms: chest pain ("funny feeling") and shortness of breath   Associated symptoms: no fever     Past Medical History  Diagnosis Date  . Hyperlipidemia   . Allergic rhinitis   . Left varicocele     history of left varicocele   Past Surgical History  Procedure Laterality Date  . Back surgery    . Esophagogastroduodenoscopy  2002/2007   Family History  Problem Relation Age of Onset  . Pancreatic cancer    . Cancer      gastric cancer  . Stroke    . Rectal cancer      aunt  . Stomach cancer    . Colon cancer Paternal Aunt   . Stomach cancer Paternal Uncle    History  Substance Use Topics  . Smoking status: Never Smoker   . Smokeless tobacco: Never Used  . Alcohol Use: No    Review of Systems  Constitutional: Negative for fever.  HENT: Negative for congestion.   Respiratory: Positive for cough and shortness of breath.   Cardiovascular: Positive for chest pain ("funny feeling").  Gastrointestinal: Negative for nausea, vomiting, abdominal pain and diarrhea.  All other systems reviewed and are negative.    Allergies  Review of patient's allergies indicates no known allergies.  Home Medications  No current outpatient prescriptions on file. BP 155/98  Pulse 50  Temp(Src) 98.1 F (36.7 C) (Oral)  Resp 18  Ht 5\' 7"  (1.702 m)  Wt 162 lb  (73.483 kg)  BMI 25.37 kg/m2  SpO2 100% Physical Exam  Nursing note and vitals reviewed. Constitutional: He is oriented to person, place, and time. He appears well-developed and well-nourished. No distress.  HENT:  Head: Normocephalic and atraumatic.  Mouth/Throat: Oropharynx is clear and moist.  Eyes: Conjunctivae are normal. Pupils are equal, round, and reactive to light. No scleral icterus.  Neck: Neck supple.  Cardiovascular: Regular rhythm, normal heart sounds and intact distal pulses.  Bradycardia present.   No murmur heard. Pulmonary/Chest: Effort normal. No stridor. No respiratory distress. He has no wheezes. He has rales in the left lower field.  Abdominal: Soft. He exhibits no distension. There is no tenderness.  Musculoskeletal: Normal range of motion. He exhibits no edema.  Neurological: He is alert and oriented to person, place, and time.  Skin: Skin is warm and dry. No rash noted.  Psychiatric: He has a normal mood and affect. His behavior is normal.    ED Course  Procedures (including critical care time) Labs Review Labs Reviewed  COMPREHENSIVE METABOLIC PANEL - Abnormal; Notable for the following:    Glucose, Bld 104 (*)    All other components within normal limits  CBC WITH DIFFERENTIAL  PROTIME-INR  URINALYSIS, ROUTINE W REFLEX MICROSCOPIC  TYPE AND SCREEN  ABO/RH   Imaging Review Dg Chest 2 View  10/17/2012   CLINICAL DATA:  Hemoptysis.  EXAM: CHEST  2 VIEW  COMPARISON:  No priors.  FINDINGS: Lung volumes are normal. No consolidative airspace disease. No pleural effusions. No pneumothorax. No pulmonary nodule or mass noted. Pulmonary vasculature and the cardiomediastinal silhouette are within normal limits.  IMPRESSION: 1. No radiographic evidence of acute cardiopulmonary disease.   Electronically Signed   By: Trudie Reed M.D.   On: 10/17/2012 21:46   Ct Angio Chest Pe W/cm &/or Wo Cm  10/17/2012   CLINICAL DATA:  Chest pain and sudden onset of shortness  of breath. Hemoptysis.  EXAM: CT ANGIOGRAPHY CHEST WITH CONTRAST  TECHNIQUE: Multidetector CT imaging of the chest was performed using the standard protocol during bolus administration of intravenous contrast. Multiplanar CT image reconstructions including MIPs were obtained to evaluate the vascular anatomy.  CONTRAST:  OMNIPAQUE IOHEXOL 350 MG/ML SOLN  COMPARISON:  Chest CT 03/01/2003.  FINDINGS: Mediastinum: Images 141-144 demonstrate a subsegmental size filling defect in the anterior aspect of the left lower lobe. No other pulmonary emboli are otherwise noted. Heart size is mildly enlarged. There is no significant pericardial fluid, thickening or pericardial calcification. There is atherosclerosis of the thoracic aorta, the great vessels of the mediastinum and the coronary arteries, including calcified atherosclerotic plaque in the left anterior descending and left circumflex coronary arteries. No pathologically enlarged mediastinal or hilar lymph nodes. Esophagus is unremarkable in appearance.  Lungs/Pleura: Patchy areas of ground-glass attenuation are noted throughout the lung bases bilaterally. Mild diffuse bronchial wall thickening. Small area of cylindrical and varicose bronchiectasis with what appears to be some chronic scarring and volume loss in the inferior segment of the lingula. No consolidative airspace disease. No pleural effusions.  Upper Abdomen: Unremarkable.  Musculoskeletal: There are no aggressive appearing lytic or blastic lesions noted in the visualized portions of the skeleton.  Review of the MIP images confirms the above findings.  IMPRESSION: 1. Study is positive for a small subsegmental sized embolus to the anterior aspect of the left lower lobe. No larger pulmonary emboli are otherwise noted. 2. Scarring an bronchiectasis in the inferior segment of the lingula may represent a potential source for hemoptysis in this patient. In addition, there are patchy areas of ground-glass  attenuation throughout the lungs bilaterally which could be indicative of some alveolar hemorrhage, or could simply represent endobronchial spread of hemorrhage from a particular source (such as the inferior segment of lingula). Clinical correlation is recommended. 3. Atherosclerosis, including two vessel coronary artery disease. Please note that although the presence of coronary artery calcium documents the presence of coronary artery disease, the severity of this disease and any potential stenosis cannot be assessed on this non-gated CT examination. Assessment for potential risk factor modification, dietary therapy or pharmacologic therapy may be warranted, if clinically indicated. 4. Mild diffuse bronchial wall thickening. CriticalValue/emergent results were called by telephone at the time of interpretation on 10/17/2012 at 11:26 PMto [facility] , who verbally acknowledged these results.   Electronically Signed   By: Trudie Reed M.D.   On: 10/17/2012 23:28  All radiology studies independently viewed by me.     EKG - Sinus rhythm, rate 51, PRI 212, normal axis, nonspecific ST changes consistent with repolarization abnormality, no priors available.   MDM   1. Hemoptysis   2. Pulmonary embolism    11:05 PM 52 yo male with sudden onset hemoptysis.  No distress, but a moderate volume of blood into a ziplock bag.  Complains of a funny feeling in his chest.  Placed on monitor.  CXR negative, labwork unremarkable.  CT  scan pending.    12:03 AM CT scan shows subsegmental PE as well as bronchiectasis in the lingula.  The source of his hemoptysis is felt to be more likely to come from the findings of his lingula.  I feel that his pulmonary embolism is likely an incidental finding. I think anticoagulation is not in his best interest at this time, secondary to his pulmonary bleeding. I have discussed his case with Dr. Darrick Penna with critical care.  He will be admitted to the hospitalist service.   Candyce Churn, MD 10/18/12 [phone]

## 2012-10-17 NOTE — ED Notes (Signed)
Patient observed with continues to have bloody mucous production.

## 2012-10-17 NOTE — ED Notes (Signed)
Patient is alert and oriented x3.  He states that he started coughing up bright red blood at 20:30. Patient states that he is not having any pain but does have congestion.

## 2012-10-18 ENCOUNTER — Inpatient Hospital Stay (HOSPITAL_COMMUNITY): Payer: Medicaid Other

## 2012-10-18 ENCOUNTER — Encounter (HOSPITAL_COMMUNITY): Payer: Self-pay | Admitting: *Deleted

## 2012-10-18 DIAGNOSIS — R042 Hemoptysis: Secondary | ICD-10-CM

## 2012-10-18 DIAGNOSIS — E785 Hyperlipidemia, unspecified: Secondary | ICD-10-CM

## 2012-10-18 DIAGNOSIS — I2699 Other pulmonary embolism without acute cor pulmonale: Secondary | ICD-10-CM

## 2012-10-18 LAB — CBC
HCT: 39.7 % (ref 39.0–52.0)
Hemoglobin: 13.4 g/dL (ref 13.0–17.0)
MCH: 30.6 pg (ref 26.0–34.0)
MCV: 90.6 fL (ref 78.0–100.0)
RBC: 4.38 MIL/uL (ref 4.22–5.81)

## 2012-10-18 LAB — URINALYSIS, ROUTINE W REFLEX MICROSCOPIC
Glucose, UA: NEGATIVE mg/dL
Hgb urine dipstick: NEGATIVE
Leukocytes, UA: NEGATIVE
Specific Gravity, Urine: >1.046 — ABNORMAL HIGH (ref 1.005–1.030)
Urobilinogen, UA: 0.2 mg/dL (ref 0.0–1.0)

## 2012-10-18 MED ORDER — BIOTENE DRY MOUTH MT LIQD: 15.0000 mL | Freq: Two times a day (BID) | OROMUCOSAL | Status: DC

## 2012-10-18 MED ORDER — SODIUM CHLORIDE 0.9 % IJ SOLN
3.0000 mL | Freq: Two times a day (BID) | INTRAMUSCULAR | Status: DC
  Administered 2012-10-18: 02:00:00 3 mL via INTRAVENOUS

## 2012-10-18 MED ORDER — TECHNETIUM TC 99M DIETHYLENETRIAME-PENTAACETIC ACID: 45.0000 | Freq: Once | INTRAVENOUS | Status: DC | PRN

## 2012-10-18 MED ORDER — SODIUM CHLORIDE 0.9 % IV SOLN
INTRAVENOUS | Status: DC
  Administered 2012-10-18 (×2): via INTRAVENOUS

## 2012-10-18 MED ORDER — LEVOFLOXACIN 500 MG PO TABS: 500.0000 mg | ORAL_TABLET | Freq: Every day | ORAL | Status: DC

## 2012-10-18 MED ORDER — INFLUENZA VAC SPLIT QUAD 0.5 ML IM SUSP: 0.5000 mL | INTRAMUSCULAR | Status: DC

## 2012-10-18 MED ORDER — TECHNETIUM TO 99M ALBUMIN AGGREGATED
5.5000 | Freq: Once | INTRAVENOUS | Status: AC | PRN
  Administered 2012-10-18: 6 via INTRAVENOUS

## 2012-10-18 NOTE — Discharge Summary (Signed)
Physician Discharge Summary  Cristian Fuller ZOX:[id number] DOB: 26-Jul-1960 DOA: 10/17/2012  PCP: Thomos Lemons, DO  Admit date: 10/17/2012 Discharge date: 10/18/2012  Time spent: 50* minutes  Recommendations for Outpatient Follow-up:  1. *Follow up PCP in 2 weeks  Discharge Diagnoses:  Active Problems:   HYPERLIPIDEMIA   Hemoptysis   Discharge Condition: Stable  Diet recommendation: regular diet  Filed Weights   10/17/12 2115 10/18/12 0120  Weight: 73.483 kg (162 lb) 72 kg (158 lb 11.7 oz)    History of present illness:   52 y.o. 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 His episode started all of a sudden and has never had this problem before. He denies chest pain, denies shortness of breath, denies fever/chills/weight loss and felt at baseline just prior to this event. No recent URI. No recent international travel other than a 2 week trip to [place] in March. He is not a smoker and is unaware of having lung problems. No lightheadedness or dizziness. In the ED patient's hemoptysis have subsided. He underwent a CT of chest which showed presence of a subsegmental PE in left lower lobe as well as bronchiectasis in left lingula which may be the potential source for his hemoptysis. PCCM was consulted by EDP and hospitalist service was asked for admission  Hospital Course:   Hemoptysis Resolved, patient was seen by pulmonary. CT view showed possible subsegmental PE. Pulmonologist did not think that patient had pulmonary embolism. Dopplers of the lower extremities and VQ scan were negative for any clot. As there was possibility of bronchiectasis on CT chest so as per recommendation by pulmonary we'll send the patient home on Levaquin 500 mg by mouth daily for 7 days. Patient does not need to followup unless hemoptysis  recurs.  Procedures:  *None  Consultations:  Pulmonary   Discharge Exam: Filed Vitals:   10/18/12 1433  BP: 112/64  Pulse: 55  Temp: 98.1 F (36.7 C)  Resp: 14    General: *Appears in no acute distress  Cardiovascular:  S1-S2 regular  Respiratory: Clear bilaterally   extremities: No edema  Discharge Instructions  Discharge Orders   Future Orders Complete By Expires   Diet - low sodium heart healthy  As directed    Increase activity slowly  As directed        Medication List         levofloxacin 500 MG tablet  Commonly known as:  LEVAQUIN  Take 1 tablet (500 mg total) by mouth daily.       No Known Allergies     Follow-up Information   Follow up with Thomos Lemons, DO In 2 weeks.   Specialty:  Internal Medicine   Contact information:   [address] [place] [address] [phone]        The results of significant diagnostics from this hospitalization (including imaging, microbiology, ancillary and laboratory) are listed below for reference.    Significant Diagnostic Studies: Dg Chest 2 View  10/17/2012   CLINICAL DATA:  Hemoptysis.  EXAM: CHEST  2 VIEW  COMPARISON:  No priors.  FINDINGS: Lung volumes are normal. No consolidative airspace disease. No pleural effusions. No pneumothorax. No pulmonary nodule or mass noted. Pulmonary vasculature and the cardiomediastinal silhouette are within normal limits.  IMPRESSION: 1. No radiographic evidence of acute cardiopulmonary disease.   Electronically  Signed   By: Trudie Reed M.D.   On: 10/17/2012 21:46   Ct Angio Chest Pe W/cm &/or Wo Cm  10/17/2012   CLINICAL DATA:  Chest pain and sudden onset of shortness of breath. Hemoptysis.  EXAM: CT ANGIOGRAPHY CHEST WITH CONTRAST  TECHNIQUE: Multidetector CT imaging of the chest was performed using the standard protocol during bolus administration of intravenous contrast. Multiplanar CT image reconstructions including MIPs were obtained to evaluate the  vascular anatomy.  CONTRAST:  OMNIPAQUE IOHEXOL 350 MG/ML SOLN  COMPARISON:  Chest CT 03/01/2003.  FINDINGS: Mediastinum: Images 141-144 demonstrate a subsegmental size filling defect in the anterior aspect of the left lower lobe. No other pulmonary emboli are otherwise noted. Heart size is mildly enlarged. There is no significant pericardial fluid, thickening or pericardial calcification. There is atherosclerosis of the thoracic aorta, the great vessels of the mediastinum and the coronary arteries, including calcified atherosclerotic plaque in the left anterior descending and left circumflex coronary arteries. No pathologically enlarged mediastinal or hilar lymph nodes. Esophagus is unremarkable in appearance.  Lungs/Pleura: Patchy areas of ground-glass attenuation are noted throughout the lung bases bilaterally. Mild diffuse bronchial wall thickening. Small area of cylindrical and varicose bronchiectasis with what appears to be some chronic scarring and volume loss in the inferior segment of the lingula. No consolidative airspace disease. No pleural effusions.  Upper Abdomen: Unremarkable.  Musculoskeletal: There are no aggressive appearing lytic or blastic lesions noted in the visualized portions of the skeleton.  Review of the MIP images confirms the above findings.  IMPRESSION: 1. Study is positive for a small subsegmental sized embolus to the anterior aspect of the left lower lobe. No larger pulmonary emboli are otherwise noted. 2. Scarring an bronchiectasis in the inferior segment of the lingula may represent a potential source for hemoptysis in this patient. In addition, there are patchy areas of ground-glass attenuation throughout the lungs bilaterally which could be indicative of some alveolar hemorrhage, or could simply represent endobronchial spread of hemorrhage from a particular source (such as the inferior segment of lingula). Clinical correlation is recommended. 3. Atherosclerosis, including  two vessel coronary artery disease. Please note that although the presence of coronary artery calcium documents the presence of coronary artery disease, the severity of this disease and any potential stenosis cannot be assessed on this non-gated CT examination. Assessment for potential risk factor modification, dietary therapy or pharmacologic therapy may be warranted, if clinically indicated. 4. Mild diffuse bronchial wall thickening. CriticalValue/emergent results were called by telephone at the time of interpretation on 10/17/2012 at 11:26 PMto [facility] , who verbally acknowledged these results.   Electronically Signed   By: Trudie Reed M.D.   On: 10/17/2012 23:28   Nm Pulmonary Perf And Vent  10/18/2012   CLINICAL DATA:  Chest pain and shortness of Breath  EXAM: NUCLEAR MEDICINE VENTILATION - PERFUSION LUNG SCAN  COMPARISON:  Chest radiograph and CT angiogram chest October 17, 2012  FINDINGS: On the ventilation study, uptake is homogeneous and symmetric bilaterally.  On the perfusion study, uptake is homogeneous and symmetric bilaterally. No appreciable ventilation/ perfusion mismatch is seen.  IMPRESSION: No ventilation or perfusion defects are identified on this study. The small filling defect seen in a subsegmental branch of the anterior segment left lower lobe pulmonary artery is not confirmed on this study. A subsegmental level perfusion defect could be obscured on this study, however.  : Views: Anterior, posterior, left lateral, right lateral, RPO, LPO, RAO, LAO-ventilation and perfusion  Radionuclide: Tc-85m DTPA aerosol -ventilation ; Tc-58m MAA -perfusion  Dose: 45.0 mCi-ventilation ; 5.5 mCi-perfusion  Route of administration: Inhalation-ventilation ; intravenous-perfusion   Electronically Signed   By: Bretta Bang   On: 10/18/2012 10:27    Microbiology: No results found for this or any previous visit (from the past 240 hour(s)).   Labs: Basic Metabolic Panel:  Recent  Labs Lab 10/17/12 2125  NA 135  K 3.8  CL 102  CO2 23  GLUCOSE 104*  BUN 13  CREATININE 0.73  CALCIUM 9.1   Liver Function Tests:  Recent Labs Lab 10/17/12 2125  AST 26  ALT 33  ALKPHOS 67  BILITOT 0.3  PROT 7.1  ALBUMIN 3.9   No results found for this basename: LIPASE, AMYLASE,  in the last 168 hours No results found for this basename: AMMONIA,  in the last 168 hours CBC:  Recent Labs Lab 10/17/12 2125 10/18/12 0457  WBC 5.8 5.8  NEUTROABS 3.0  --   HGB 14.4 13.4  HCT 42.2 39.7  MCV 90.6 90.6  PLT 197 179   Cardiac Enzymes: No results found for this basename: CKTOTAL, CKMB, CKMBINDEX, TROPONINI,  in the last 168 hours BNP:     Signed:  Vanna Shavers S  [facility] 10/18/2012, 4:16 PM

## 2012-10-18 NOTE — Consult Note (Addendum)
PULMONARY  / CRITICAL CARE MEDICINE  Name: Cristian Fuller MRN: [id number] DOB: [birth year]    ADMISSION DATE:  10/17/2012 CONSULTATION DATE:  10/18/2012  REFERRING MD :  Dr. Elvera Lennox PRIMARY SERVICE: Hospitalist  CHIEF COMPLAINT:  Hemoptysis  BRIEF PATIENT DESCRIPTION:  7 M non-smoker with hemoptysis.  Hx of positive PPD s/p INH therapy several yrs ago.  STUDIES:  9/14 CT chest >> ?filling defect in LLL anterior sub segment, atherosclerosis, patchy areas of GGO b/l at bases, small area of BTX in lingula 9/15 V/Q scan >> no ventilation of perfusion defects noted 9/15 Doppler legs >> negative for DVT 9/15 ANA, ANCA, Resp Viral panel >>   SUBJECTIVE: No further episodes of hemoptysis since yesterday.  Denies chest pain.  Anxious to go home.  PHYSICAL EXAM  VITAL SIGNS: Temp:  [97.7 F (36.5 C)-98.1 F (36.7 C)] 98.1 F (36.7 C) (09/15 1433) Pulse Rate:  [46-55] 55 (09/15 1433) Resp:  [14-18] 14 (09/15 1433) BP: (112-155)/(64-98) 112/64 mmHg (09/15 1433) SpO2:  [95 %-100 %] 95 % (09/15 1433) Weight:  [158 lb 11.7 oz (72 kg)-162 lb (73.483 kg)] 158 lb 11.7 oz (72 kg) (09/15 0120) Room air  INTAKE / OUTPUT: Intake/Output     09/14 0701 - 09/15 0700 09/15 0701 - 09/16 0700   P.O.  120   I.V. (mL/kg) 382.5 (5.3) 600 (8.3)   Total Intake(mL/kg) 382.5 (5.3) 720 (10)   Net +382.5 +720        Urine Occurrence  1 x     PHYSICAL EXAMINATION: General: No distress Neuro: normal strength HEENT: no sinus tendernss Cardiovascular: regular, no murmur Lungs: no wheeze/rales Abdomen: soft, non tender Musculoskeletal: no edema Skin: no rashes  LABS:  CBC Recent Labs     10/17/12  2125  10/18/12  0457  WBC  5.8  5.8  HGB  14.4  13.4  HCT  42.2  39.7  PLT  197  179    Coag's Recent Labs     10/17/12  2125  INR  0.87    BMET Recent Labs     10/17/12  2125  NA  135  K  3.8  CL  102  CO2  23  BUN  13  CREATININE  0.73  GLUCOSE  104*    Electrolytes Recent  Labs     10/17/12  2125  CALCIUM  9.1    Liver Enzymes Recent Labs     10/17/12  2125  AST  26  ALT  33  ALKPHOS  67  BILITOT  0.3  ALBUMIN  3.9   Imaging Dg Chest 2 View  10/17/2012   CLINICAL DATA:  Hemoptysis.  EXAM: CHEST  2 VIEW  COMPARISON:  No priors.  FINDINGS: Lung volumes are normal. No consolidative airspace disease. No pleural effusions. No pneumothorax. No pulmonary nodule or mass noted. Pulmonary vasculature and the cardiomediastinal silhouette are within normal limits.  IMPRESSION: 1. No radiographic evidence of acute cardiopulmonary disease.   Electronically Signed   By: Trudie Reed M.D.   On: 10/17/2012 21:46   Ct Angio Chest Pe W/cm &/or Wo Cm  10/17/2012   CLINICAL DATA:  Chest pain and sudden onset of shortness of breath. Hemoptysis.  EXAM: CT ANGIOGRAPHY CHEST WITH CONTRAST  TECHNIQUE: Multidetector CT imaging of the chest was performed using the standard protocol during bolus administration of intravenous contrast. Multiplanar CT image reconstructions including MIPs were obtained to evaluate the vascular anatomy.  CONTRAST:  OMNIPAQUE IOHEXOL  350 MG/ML SOLN  COMPARISON:  Chest CT 03/01/2003.  FINDINGS: Mediastinum: Images 141-144 demonstrate a subsegmental size filling defect in the anterior aspect of the left lower lobe. No other pulmonary emboli are otherwise noted. Heart size is mildly enlarged. There is no significant pericardial fluid, thickening or pericardial calcification. There is atherosclerosis of the thoracic aorta, the great vessels of the mediastinum and the coronary arteries, including calcified atherosclerotic plaque in the left anterior descending and left circumflex coronary arteries. No pathologically enlarged mediastinal or hilar lymph nodes. Esophagus is unremarkable in appearance.  Lungs/Pleura: Patchy areas of ground-glass attenuation are noted throughout the lung bases bilaterally. Mild diffuse bronchial wall thickening. Small area of  cylindrical and varicose bronchiectasis with what appears to be some chronic scarring and volume loss in the inferior segment of the lingula. No consolidative airspace disease. No pleural effusions.  Upper Abdomen: Unremarkable.  Musculoskeletal: There are no aggressive appearing lytic or blastic lesions noted in the visualized portions of the skeleton.  Review of the MIP images confirms the above findings.  IMPRESSION: 1. Study is positive for a small subsegmental sized embolus to the anterior aspect of the left lower lobe. No larger pulmonary emboli are otherwise noted. 2. Scarring an bronchiectasis in the inferior segment of the lingula may represent a potential source for hemoptysis in this patient. In addition, there are patchy areas of ground-glass attenuation throughout the lungs bilaterally which could be indicative of some alveolar hemorrhage, or could simply represent endobronchial spread of hemorrhage from a particular source (such as the inferior segment of lingula). Clinical correlation is recommended. 3. Atherosclerosis, including two vessel coronary artery disease. Please note that although the presence of coronary artery calcium documents the presence of coronary artery disease, the severity of this disease and any potential stenosis cannot be assessed on this non-gated CT examination. Assessment for potential risk factor modification, dietary therapy or pharmacologic therapy may be warranted, if clinically indicated. 4. Mild diffuse bronchial wall thickening. CriticalValue/emergent results were called by telephone at the time of interpretation on 10/17/2012 at 11:26 PMto [facility] , who verbally acknowledged these results.   Electronically Signed   By: Trudie Reed M.D.   On: 10/17/2012 23:28   Nm Pulmonary Perf And Vent  10/18/2012   CLINICAL DATA:  Chest pain and shortness of Breath  EXAM: NUCLEAR MEDICINE VENTILATION - PERFUSION LUNG SCAN  COMPARISON:  Chest radiograph and CT angiogram  chest October 17, 2012  FINDINGS: On the ventilation study, uptake is homogeneous and symmetric bilaterally.  On the perfusion study, uptake is homogeneous and symmetric bilaterally. No appreciable ventilation/ perfusion mismatch is seen.  IMPRESSION: No ventilation or perfusion defects are identified on this study. The small filling defect seen in a subsegmental branch of the anterior segment left lower lobe pulmonary artery is not confirmed on this study. A subsegmental level perfusion defect could be obscured on this study, however.  : Views: Anterior, posterior, left lateral, right lateral, RPO, LPO, RAO, LAO-ventilation and perfusion  Radionuclide: Tc-26m DTPA aerosol -ventilation ; Tc-81m MAA -perfusion  Dose: 45.0 mCi-ventilation ; 5.5 mCi-perfusion  Route of administration: Inhalation-ventilation ; intravenous-perfusion   Electronically Signed   By: Bretta Bang   On: 10/18/2012 10:27    ASSESSMENT / PLAN:  A: Hemoptysis >> there was concern for filling defect on CTA, but I agree this may not represent actual PE.  V/Q scan and doppler legs are normal.  Found to have small area of BTX on CT chest >> hemoptysis  likely related to this.  Hemoptysis has resolved. P: -do not think he needs systemic anticoagulation -stable for d/c home from pulmonary standpoint -recommend course of Abx >> levaquin 500 mg daily for 7 days or equivalent recommend >> defer to primary team  He does not need outpt pulmonary follow up unless his hemoptysis recurs.  Coralyn Helling, MD [facility] Pulmonary/Critical Care 10/18/2012, 3:41 PM Pager:  [phone] After 3pm call: [phone]

## 2012-10-18 NOTE — H&P (Signed)
[facility] History and Physical  Sullivan Jacuinde ZOX:[id number] DOB: June 02, 1960 DOA: 10/17/2012  Referring physician: Dr. Loretha Stapler PCP: Thomos Lemons, DO  Specialists: PCCM  Chief Complaint: coughing up blood  HPI: Cristian Fuller is a 52 y.o. 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 His episode started all of a sudden and has never had this problem before. He denies chest pain, denies shortness of breath, denies fever/chills/weight loss and felt at baseline just prior to this event. No recent URI. No recent international travel other than a 2 week trip to [place] in March. He is not a smoker and is unaware of having lung problems. No lightheadedness or dizziness. In the ED patient's hemoptysis have subsided. He underwent a CT of chest which showed presence of a subsegmental PE in left lower lobe as well as bronchiectasis in left lingula which may be the potential source for his hemoptysis. PCCM was consulted by EDP and hospitalist service was asked for admission.   Review of Systems: as per HPI otherwise negative.   Past Medical History  Diagnosis Date  . Hyperlipidemia   . Allergic rhinitis   . Left varicocele     history of left varicocele   Past Surgical History  Procedure Laterality Date  . Back surgery    . Esophagogastroduodenoscopy  2002/2007   Social History:  reports that he has never smoked. He has never used smokeless tobacco. He reports that he does not drink alcohol or use illicit drugs.  No Known Allergies  Family History  Problem Relation Age of Onset  . Pancreatic cancer    . Cancer      gastric cancer  . Stroke    . Rectal cancer      aunt  . Stomach cancer    . Colon cancer Paternal Aunt   . Stomach cancer Paternal Uncle    Prior to Admission medications   Not on File   Physical Exam: Filed Vitals:   10/17/12  2115 10/18/12 0020  BP: 155/98 114/74  Pulse: 50 51  Temp: 98.1 F (36.7 C)   TempSrc: Oral   Resp: 18 16  Height: 5\' 7"  (1.702 m)   Weight: 73.483 kg (162 lb)   SpO2: 100% 96%     General:  No apparent distress, comfortable on room air  Eyes: no scleral icterus  ENT: moist oropharynx  Neck: supple, no JVD  Cardiovascular: regular rate without MRG; 2+ peripheral pulses  Respiratory: CTA biL, good air movement without wheezing, rhonchi or crackled  Abdomen: soft, non tender to palpation, positive bowel sounds, no guarding, no rebound  Skin: no rashes  Musculoskeletal: no peripheral edema  Psychiatric: normal mood and affect  Neurologic: non focal  Labs on Admission:  Basic Metabolic Panel:  Recent Labs Lab 10/17/12 2125  NA 135  K 3.8  CL 102  CO2 23  GLUCOSE 104*  BUN 13  CREATININE 0.73  CALCIUM 9.1   Liver Function Tests:  Recent Labs Lab 10/17/12 2125  AST 26  ALT 33  ALKPHOS 67  BILITOT 0.3  PROT 7.1  ALBUMIN 3.9   CBC:  Recent Labs Lab 10/17/12 2125  WBC 5.8  NEUTROABS 3.0  HGB 14.4  HCT 42.2  MCV 90.6  PLT 197   Radiological Exams on Admission: Dg Chest 2 View  10/17/2012  CLINICAL DATA:  Hemoptysis.  EXAM: CHEST  2 VIEW  COMPARISON:  No priors.  FINDINGS: Lung volumes are normal. No consolidative airspace disease. No pleural effusions. No pneumothorax. No pulmonary nodule or mass noted. Pulmonary vasculature and the cardiomediastinal silhouette are within normal limits.  IMPRESSION: 1. No radiographic evidence of acute cardiopulmonary disease.   Electronically Signed   By: Trudie Reed M.D.   On: 10/17/2012 21:46   Ct Angio Chest Pe W/cm &/or Wo Cm  10/17/2012   CLINICAL DATA:  Chest pain and sudden onset of shortness of breath. Hemoptysis.  EXAM: CT ANGIOGRAPHY CHEST WITH CONTRAST  TECHNIQUE: Multidetector CT imaging of the chest was performed using the standard protocol during bolus administration of intravenous contrast.  Multiplanar CT image reconstructions including MIPs were obtained to evaluate the vascular anatomy.  CONTRAST:  OMNIPAQUE IOHEXOL 350 MG/ML SOLN  COMPARISON:  Chest CT 03/01/2003.  FINDINGS: Mediastinum: Images 141-144 demonstrate a subsegmental size filling defect in the anterior aspect of the left lower lobe. No other pulmonary emboli are otherwise noted. Heart size is mildly enlarged. There is no significant pericardial fluid, thickening or pericardial calcification. There is atherosclerosis of the thoracic aorta, the great vessels of the mediastinum and the coronary arteries, including calcified atherosclerotic plaque in the left anterior descending and left circumflex coronary arteries. No pathologically enlarged mediastinal or hilar lymph nodes. Esophagus is unremarkable in appearance.  Lungs/Pleura: Patchy areas of ground-glass attenuation are noted throughout the lung bases bilaterally. Mild diffuse bronchial wall thickening. Small area of cylindrical and varicose bronchiectasis with what appears to be some chronic scarring and volume loss in the inferior segment of the lingula. No consolidative airspace disease. No pleural effusions.  Upper Abdomen: Unremarkable.  Musculoskeletal: There are no aggressive appearing lytic or blastic lesions noted in the visualized portions of the skeleton.  Review of the MIP images confirms the above findings.  IMPRESSION: 1. Study is positive for a small subsegmental sized embolus to the anterior aspect of the left lower lobe. No larger pulmonary emboli are otherwise noted. 2. Scarring an bronchiectasis in the inferior segment of the lingula may represent a potential source for hemoptysis in this patient. In addition, there are patchy areas of ground-glass attenuation throughout the lungs bilaterally which could be indicative of some alveolar hemorrhage, or could simply represent endobronchial spread of hemorrhage from a particular source (such as the inferior segment  of lingula). Clinical correlation is recommended. 3. Atherosclerosis, including two vessel coronary artery disease. Please note that although the presence of coronary artery calcium documents the presence of coronary artery disease, the severity of this disease and any potential stenosis cannot be assessed on this non-gated CT examination. Assessment for potential risk factor modification, dietary therapy or pharmacologic therapy may be warranted, if clinically indicated. 4. Mild diffuse bronchial wall thickening. CriticalValue/emergent results were called by telephone at the time of interpretation on 10/17/2012 at 11:26 PMto [facility] , who verbally acknowledged these results.   Electronically Signed   By: Trudie Reed M.D.   On: 10/17/2012 23:28   EKG: Independently reviewed. Sinus rhythm  Assessment/Plan Active Problems:   HYPERLIPIDEMIA   Hemoptysis   Hemoptysis - likely due to the bronchiectasis presence. Last CT in 2005 also with similar findings in the lingular segment. Appreciate pulmonology input, case discussed with Dr. Darrick Penna by EDP and patient will be evaluated by the fellow on call overnight. Will keep patient NPO until evaluated by Pulm should he need a bronch in am. Patient  currently in no distress, his hemoptysis seems to have subsided and is comfortable on room air. Admit to telemetry bed.  PE - suspect more of an incidental finding. Holding anticoagulation now in light of #1 Bradycardia - noted on EKG and telemetry, HR 50-60. Asymptomatic. Monitor on telemetry.  DVT Prophylaxis - SCDs   Code Status: Full  Family Communication: none  Disposition Plan: inpatient  Time spent: 67  Seaborn Nakama M. Elvera Lennox, MD [facility] Pager [phone]  If 7PM-7AM, please contact night-coverage [email or web address] Password [facility] 10/18/2012, 12:48 AM

## 2012-10-18 NOTE — Consult Note (Signed)
PULMONARY  / CRITICAL CARE MEDICINE  Name: Cristian Fuller MRN: [id number] DOB: [birth year]    ADMISSION DATE:  10/17/2012 CONSULTATION DATE:  10/18/2012  REFERRING MD :  Dr. Elvera Lennox PRIMARY SERVICE: Hospitalist  CHIEF COMPLAINT:  Hemoptysis  BRIEF PATIENT DESCRIPTION: 52 M non-smoker with hemoptysis in setting of PE and recent cough.  SIGNIFICANT EVENTS / STUDIES:  1. PE protocol CT positive for subsegmental PE, bronchial thickening  LINES / TUBES: 1. PIVs  CULTURES: 1. None  ANTIBIOTICS: 1. 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 He had a cough productive of yellowish mucous for several days preceding the episode of hemoptysis. He denies associated fever, chills, rhinnorhea, sore throat, or wheezing. He denies chest pain yesterday but he did have an episode of chest pain earlier in the week when he was doing vigorous exercise. He denies LE edema. He denies hematuria.  PAST MEDICAL HISTORY :  Past Medical History  Diagnosis Date  . Hyperlipidemia   . Allergic rhinitis   . Left varicocele     history of left varicocele    Past Surgical History  Procedure Laterality Date  . Back surgery    . Esophagogastroduodenoscopy  2002/2007    Prior to Admission medications   Not on File    No Known Allergies  FAMILY HISTORY:  Family History  Problem Relation Age of Onset  . Pancreatic cancer    . Cancer      gastric cancer  . Stroke    . Rectal cancer      aunt  . Stomach cancer    . Colon cancer Paternal Aunt   . Stomach cancer Paternal Uncle     SOCIAL HISTORY:  reports that he has never smoked. He has never used smokeless tobacco. He reports that he does not drink alcohol or use illicit drugs.  REVIEW OF SYSTEMS:  A 12-system ROS was conducted and, unless otherwise specified in the HPI, was negative.  PHYSICAL  EXAM  VITAL SIGNS: Temp:  [97.7 F (36.5 C)-98.1 F (36.7 C)] 97.7 F (36.5 C) (09/15 0120) Pulse Rate:  [46-51] 46 (09/15 0120) Resp:  [16-18] 16 (09/15 0120) BP: (114-155)/(74-98) 135/82 mmHg (09/15 0120) SpO2:  [96 %-100 %] 100 % (09/15 0120) Weight:  [158 lb 11.7 oz (72 kg)-162 lb (73.483 kg)] 158 lb 11.7 oz (72 kg) (09/15 0120)  HEMODYNAMICS:    VENTILATOR SETTINGS:    INTAKE / OUTPUT: Intake/Output   None     PHYSICAL EXAMINATION: General:  WDWN M in NAD Neuro:  Intact HEENT:  Sclera Anicteric, Conjunctiva Pink, MMM, OP clear Neck:  Trachea supple and Midline, (-) LAN or JVD Cardiovascular:  RRR, NS1/S2, (-) MRG Lungs:  CTAB, Normal WOB Abdomen:  S/NT/ND/(+)BS Musculoskeletal:  (-) C/C/E Skin:  (-) Rashes  LABS:  CBC Recent Labs     10/17/12  2125  WBC  5.8  HGB  14.4  HCT  42.2  PLT  197    Coag's Recent Labs     10/17/12  2125  INR  0.87    BMET Recent Labs     10/17/12  2125  NA  135  K  3.8  CL  102  CO2  23  BUN  13  CREATININE  0.73  GLUCOSE  104*    Electrolytes Recent Labs  10/17/12  2125  CALCIUM  9.1    Sepsis Markers No results found for this basename: LACTICACIDVEN, PROCALCITON, O2SATVEN,  in the last 72 hours  ABG No results found for this basename: PHART, PCO2ART, PO2ART,  in the last 72 hours  Liver Enzymes Recent Labs     10/17/12  2125  AST  26  ALT  33  ALKPHOS  67  BILITOT  0.3  ALBUMIN  3.9    Cardiac Enzymes No results found for this basename: TROPONINI, PROBNP,  in the last 72 hours  Glucose No results found for this basename: GLUCAP,  in the last 72 hours  Imaging Dg Chest 2 View  10/17/2012   CLINICAL DATA:  Hemoptysis.  EXAM: CHEST  2 VIEW  COMPARISON:  No priors.  FINDINGS: Lung volumes are normal. No consolidative airspace disease. No pleural effusions. No pneumothorax. No pulmonary nodule or mass noted. Pulmonary vasculature and the cardiomediastinal silhouette are within normal  limits.  IMPRESSION: 1. No radiographic evidence of acute cardiopulmonary disease.   Electronically Signed   By: Trudie Reed M.D.   On: 10/17/2012 21:46   Ct Angio Chest Pe W/cm &/or Wo Cm  10/17/2012   CLINICAL DATA:  Chest pain and sudden onset of shortness of breath. Hemoptysis.  EXAM: CT ANGIOGRAPHY CHEST WITH CONTRAST  TECHNIQUE: Multidetector CT imaging of the chest was performed using the standard protocol during bolus administration of intravenous contrast. Multiplanar CT image reconstructions including MIPs were obtained to evaluate the vascular anatomy.  CONTRAST:  OMNIPAQUE IOHEXOL 350 MG/ML SOLN  COMPARISON:  Chest CT 03/01/2003.  FINDINGS: Mediastinum: Images 141-144 demonstrate a subsegmental size filling defect in the anterior aspect of the left lower lobe. No other pulmonary emboli are otherwise noted. Heart size is mildly enlarged. There is no significant pericardial fluid, thickening or pericardial calcification. There is atherosclerosis of the thoracic aorta, the great vessels of the mediastinum and the coronary arteries, including calcified atherosclerotic plaque in the left anterior descending and left circumflex coronary arteries. No pathologically enlarged mediastinal or hilar lymph nodes. Esophagus is unremarkable in appearance.  Lungs/Pleura: Patchy areas of ground-glass attenuation are noted throughout the lung bases bilaterally. Mild diffuse bronchial wall thickening. Small area of cylindrical and varicose bronchiectasis with what appears to be some chronic scarring and volume loss in the inferior segment of the lingula. No consolidative airspace disease. No pleural effusions.  Upper Abdomen: Unremarkable.  Musculoskeletal: There are no aggressive appearing lytic or blastic lesions noted in the visualized portions of the skeleton.  Review of the MIP images confirms the above findings.  IMPRESSION: 1. Study is positive for a small subsegmental sized embolus to the anterior  aspect of the left lower lobe. No larger pulmonary emboli are otherwise noted. 2. Scarring an bronchiectasis in the inferior segment of the lingula may represent a potential source for hemoptysis in this patient. In addition, there are patchy areas of ground-glass attenuation throughout the lungs bilaterally which could be indicative of some alveolar hemorrhage, or could simply represent endobronchial spread of hemorrhage from a particular source (such as the inferior segment of lingula). Clinical correlation is recommended. 3. Atherosclerosis, including two vessel coronary artery disease. Please note that although the presence of coronary artery calcium documents the presence of coronary artery disease, the severity of this disease and any potential stenosis cannot be assessed on this non-gated CT examination. Assessment for potential risk factor modification, dietary therapy or pharmacologic therapy may be warranted, if clinically indicated. 4. Mild  diffuse bronchial wall thickening. CriticalValue/emergent results were called by telephone at the time of interpretation on 10/17/2012 at 11:26 PMto [facility] , who verbally acknowledged these results.   Electronically Signed   By: Trudie Reed M.D.   On: 10/17/2012 23:28    EKG: Personally reviewed. Possible 1st degree heart block. Otherwise WNL CXR: Personally reviewed, WNL  ASSESSMENT / PLAN: Active Problems:   HYPERLIPIDEMIA   Hemoptysis   PULMONARY A: 1. Hemoptysis: The differential diagnosis is broad for Cristian Fuller and includes PE, bronchitis, and bronchiectasis. The PE noted on the chest CT is not 100% convincing; I wonder if it could be artifact or scaring associated with the bronchiectasis. His history is not terribly convincing for PE; it is much more consistent with bronchitis vs. Bronchiectasis exacerbation.   P:    V/Q, LE doppler to probe possibility of PE  Hold anticoagulation for now  Viral Panel, Sputum Culture  Check UA for  blood  Autoimmune screening   TODAY'S SUMMARY:   I have personally obtained a history, examined the patient, evaluated laboratory and imaging results, formulated the assessment and plan and placed orders.  Evalyn Casco, MD Pulmonary and Critical Care Medicine [facility] Pager: [phone]  10/18/2012, 4:05 AM

## 2012-10-18 NOTE — Progress Notes (Signed)
*  PRELIMINARY RESULTS* Vascular Ultrasound Lower extremity venous duplex has been completed.  Preliminary findings: negative for DVT.    Farrel Demark, RDMS, RVT  10/18/2012, 3:16 PM

## 2012-10-18 NOTE — Progress Notes (Signed)
Pt admitted with bradycardia ranging from 38-54 at this time. Rhythm is sinus per telemetry, pt asymptomatic. Pt was previously bradycardic in the ED as well when MD admitted pt to hospital. NP on call notified and made aware of current rate and rhythm. No orders received. Will continue to monitor.

## 2012-10-19 ENCOUNTER — Telehealth: Payer: Self-pay | Admitting: *Deleted

## 2012-10-19 LAB — RESPIRATORY VIRUS PANEL
Influenza A: NOT DETECTED
Influenza B: NOT DETECTED
Parainfluenza 2: NOT DETECTED
Parainfluenza 3: NOT DETECTED

## 2012-10-19 LAB — ANA: Anti Nuclear Antibody(ANA): NEGATIVE

## 2012-10-19 LAB — ANCA SCREEN W REFLEX TITER
Atypical p-ANCA Screen: NEGATIVE
c-ANCA Screen: NEGATIVE

## 2012-10-19 NOTE — Telephone Encounter (Signed)
Pt is Cristian Fuller access

## 2012-12-09 ENCOUNTER — Other Ambulatory Visit: Payer: Self-pay

## 2013-07-25 ENCOUNTER — Emergency Department (HOSPITAL_BASED_OUTPATIENT_CLINIC_OR_DEPARTMENT_OTHER)
Admission: EM | Admit: 2013-07-25 | Discharge: 2013-07-25 | Disposition: A | Discharge status: Home / Self Care | Payer: No Typology Code available for payment source | Attending: Emergency Medicine | Admitting: Emergency Medicine

## 2013-07-25 ENCOUNTER — Encounter (HOSPITAL_BASED_OUTPATIENT_CLINIC_OR_DEPARTMENT_OTHER): Payer: Self-pay | Admitting: Emergency Medicine

## 2013-07-25 DIAGNOSIS — Z792 Long term (current) use of antibiotics: Secondary | ICD-10-CM | POA: Insufficient documentation

## 2013-07-25 DIAGNOSIS — IMO0002 Reserved for concepts with insufficient information to code with codable children: Secondary | ICD-10-CM | POA: Insufficient documentation

## 2013-07-25 DIAGNOSIS — T148XXA Other injury of unspecified body region, initial encounter: Secondary | ICD-10-CM

## 2013-07-25 DIAGNOSIS — Z8679 Personal history of other diseases of the circulatory system: Secondary | ICD-10-CM | POA: Insufficient documentation

## 2013-07-25 DIAGNOSIS — S0990XA Unspecified injury of head, initial encounter: Secondary | ICD-10-CM | POA: Insufficient documentation

## 2013-07-25 DIAGNOSIS — E785 Hyperlipidemia, unspecified: Secondary | ICD-10-CM | POA: Insufficient documentation

## 2013-07-25 DIAGNOSIS — Y9389 Activity, other specified: Secondary | ICD-10-CM | POA: Insufficient documentation

## 2013-07-25 DIAGNOSIS — Y92410 Unspecified street and highway as the place of occurrence of the external cause: Secondary | ICD-10-CM | POA: Insufficient documentation

## 2013-07-25 MED ORDER — HYDROCODONE-ACETAMINOPHEN 5-325 MG PO TABS: 1.0000 | ORAL_TABLET | Freq: Four times a day (QID) | ORAL | Status: DC | PRN

## 2013-07-25 MED ORDER — CYCLOBENZAPRINE HCL 5 MG PO TABS: 5.0000 mg | ORAL_TABLET | Freq: Two times a day (BID) | ORAL | Status: DC | PRN
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
## 2013-07-26 NOTE — ED Provider Notes (Signed)
Medical screening examination/treatment/procedure(s) were performed by non-physician practitioner and as supervising physician I was immediately available for consultation/collaboration.    Dot Lanes, MD 07/26/13 [phone]

## 2014-08-24 ENCOUNTER — Encounter: Payer: Self-pay | Admitting: Gastroenterology

## 2015-08-30 ENCOUNTER — Emergency Department (HOSPITAL_COMMUNITY)
Admission: EM | Admit: 2015-08-30 | Discharge: 2015-08-30 | Disposition: A | Discharge status: Home / Self Care | Payer: Medicaid Other | Attending: Emergency Medicine | Admitting: Emergency Medicine

## 2015-08-30 ENCOUNTER — Encounter (HOSPITAL_COMMUNITY): Payer: Self-pay | Admitting: Emergency Medicine

## 2015-08-30 DIAGNOSIS — R21 Rash and other nonspecific skin eruption: Secondary | ICD-10-CM | POA: Diagnosis present

## 2015-08-30 DIAGNOSIS — Z79899 Other long term (current) drug therapy: Secondary | ICD-10-CM | POA: Insufficient documentation

## 2015-08-30 DIAGNOSIS — L259 Unspecified contact dermatitis, unspecified cause: Secondary | ICD-10-CM | POA: Insufficient documentation

## 2015-08-30 DIAGNOSIS — E785 Hyperlipidemia, unspecified: Secondary | ICD-10-CM | POA: Diagnosis not present

## 2015-08-30 MED ORDER — DEXAMETHASONE SODIUM PHOSPHATE 10 MG/ML IJ SOLN
4.0000 mg | Freq: Once | INTRAMUSCULAR | Status: AC
  Administered 2015-08-30: 4 mg via INTRAMUSCULAR
  Filled 2015-08-30: qty 1

## 2015-08-30 MED ORDER — EPINEPHRINE 0.3 MG/0.3ML IJ SOAJ: 0.3000 mg | Freq: Once | INTRAMUSCULAR | 1 refills | Status: AC

## 2015-08-30 MED ORDER — PREDNISONE 20 MG PO TABS: ORAL_TABLET | ORAL | 0 refills | Status: DC

## 2015-08-30 NOTE — ED Triage Notes (Signed)
Pt has a fine red rash noted over his body  Pt states it started on Monday and has progressively gotten worse  Pt states he itches all over

## 2015-08-30 NOTE — ED Provider Notes (Signed)
[facility] DEPT Provider Note   CSN: [id number] Arrival date & time: 08/30/15  2135  First Provider Contact:  First MD Initiated Contact with Patient 08/30/15 2301        History   Chief Complaint Chief Complaint  Patient presents with  . Rash    HPI Cristian Fuller is a 55 y.o. male.  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 He states this weekend he did a lot of mowing and trimming of weeds and brush that had a lot of poison ivy present. He states that he has started getting a rash on his back his arms and legs that is very itchy and bumpy. He states that this continues to worsen and he is having a lot of itching discomfort. 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 He states he has concern for severe allergic reaction to stings and is often in remote areas. Past Medical History:  Diagnosis Date  . Allergic rhinitis   . Hyperlipidemia   . Left varicocele    history of left varicocele    Patient Active Problem List   Diagnosis Date Noted  . Hemoptysis 10/18/2012  . Preventative health care 04/07/2011  . NUMBNESS, HAND 12/27/2007  . HYPERLIPIDEMIA 12/24/2006  . ALLERGIC RHINITIS 12/24/2006  . GERD 12/24/2006  . TB SKIN TEST, POSITIVE 12/24/2006    Past Surgical History:  Procedure Laterality Date  . BACK SURGERY    . ESOPHAGOGASTRODUODENOSCOPY  2002/2007       Home Medications    Prior to Admission medications   Medication Sig Start Date End Date Taking? Authorizing Provider  vitamin C (ASCORBIC ACID) 250 MG tablet Take 250 mg by mouth daily.   Yes Historical Provider, MD  cyclobenzaprine (FLEXERIL) 5 MG tablet Take 1 tablet (5 mg total) by mouth 2 (two) times daily as needed for muscle spasms. Patient not taking: Reported on 08/30/2015 07/25/13   Glendell Docker, NP  EPINEPHrine (EPIPEN 2-PAK) 0.3 mg/0.3 mL IJ SOAJ injection Inject 0.3 mLs (0.3 mg total) into the  muscle once. 08/30/15 08/30/15  Charlesetta Shanks, MD  HYDROcodone-acetaminophen (NORCO/VICODIN) 5-325 MG per tablet Take 1-2 tablets by mouth every 6 (six) hours as needed. Patient not taking: Reported on 08/30/2015 07/25/13   Glendell Docker, NP  levofloxacin (LEVAQUIN) 500 MG tablet Take 1 tablet (500 mg total) by mouth daily. Patient not taking: Reported on 08/30/2015 10/18/12   Oswald Hillock, MD  predniSONE (DELTASONE) 20 MG tablet 3 tabs po daily x 3 days, then 2 tabs x 3 days, then 1.5 tabs x 3 days, then 1 tab x 3 days, then 0.5 tabs x 3 days 08/30/15   Charlesetta Shanks, MD    Family History Family History  Problem Relation Age of Onset  . Pancreatic cancer    . Cancer      gastric cancer  . Stroke    . Rectal cancer      aunt  . Stomach cancer    . Colon cancer Paternal Aunt   . Stomach cancer Paternal Uncle     Social History Social History  Substance Use Topics  . Smoking status: Never Smoker  . Smokeless tobacco: Never Used  . Alcohol use No     Allergies   Review of patient's allergies indicates no known allergies.   Review of Systems Review of Systems Constitutional: No fevers no chills Respiratory: No cough or shortness of breath  or wheezing GI: No abdominal pain or vomiting or diarrhea  Physical Exam Updated Vital Signs BP 132/97   Pulse 75   Temp 98.5 F (36.9 C) (Oral)   Resp 18   Ht 5\' 7"  (1.702 m)   Wt 155 lb (70.3 kg)   SpO2 97%   BMI 24.28 kg/m   Physical Exam  Constitutional: He is oriented to person, place, and time. He appears well-developed and well-nourished.  HENT:  Head: Normocephalic and atraumatic.  Mouth/Throat: Oropharynx is clear and moist.  Eyes: EOM are normal. No scleral icterus.  Cardiovascular: Normal rate.   Pulmonary/Chest: Effort normal.  Neurological: He is alert and oriented to person, place, and time. He exhibits normal muscle tone. Coordination normal.  Skin: Skin is warm and dry. No rash noted.  Patient is extensive  fine papular\vesicular rash on his right forearm, left axilla, most of his left back. Over abdomen and beginning on lower extremities.     ED Treatments / Results  Labs (all labs ordered are listed, but only abnormal results are displayed) Labs Reviewed - No data to display  EKG  EKG Interpretation None       Radiology No results found.  Procedures Procedures (including critical care time)  Medications Ordered in ED Medications  dexamethasone (DECADRON) injection 4 mg (not administered)     Initial Impression / Assessment and Plan / ED Course  I have reviewed the triage vital signs and the nursing notes.  Pertinent labs & imaging results that were available during my care of the patient were reviewed by me and considered in my medical decision making (see chart for details).  Clinical Course      Final Clinical Impressions(s) / ED Diagnoses   Final diagnoses:  Contact dermatitis  Patient has extensive contact dermatitis over the body. His face is spared. He'll be given a Decadron shot and a two-week steroid taper. He denies any other medical problems. He does request prescription for an epinephrine pen to have available in event of severe Hymenoptera allergic reaction. He reports that his job he is frequently a working in areas where he is at risk for multiple stings. He is counseled on what anaphylaxis is and only using the epinephrine pen for severe reactions.  New Prescriptions New Prescriptions   EPINEPHRINE (EPIPEN 2-PAK) 0.3 MG/0.3 ML IJ SOAJ INJECTION    Inject 0.3 mLs (0.3 mg total) into the muscle once.   PREDNISONE (DELTASONE) 20 MG TABLET    3 tabs po daily x 3 days, then 2 tabs x 3 days, then 1.5 tabs x 3 days, then 1 tab x 3 days, then 0.5 tabs x 3 days     Charlesetta Shanks, MD 08/30/15 [phone]

## 2016-06-10 LAB — GLUCOSE, POCT (MANUAL RESULT ENTRY): POC Glucose: 99 mg/dl (ref 70–99)

## 2016-06-15 ENCOUNTER — Encounter: Payer: Self-pay | Admitting: *Deleted

## 2016-06-15 DIAGNOSIS — Z139 Encounter for screening, unspecified: Secondary | ICD-10-CM

## 2019-07-11 ENCOUNTER — Other Ambulatory Visit (HOSPITAL_COMMUNITY): Payer: Self-pay | Admitting: Cardiovascular Disease

## 2019-07-11 DIAGNOSIS — R079 Chest pain, unspecified: Secondary | ICD-10-CM

## 2019-07-18 ENCOUNTER — Encounter (HOSPITAL_COMMUNITY)
Admission: RE | Admit: 2019-07-18 | Discharge: 2019-07-18 | Disposition: A | Discharge status: Home / Self Care | Payer: Medicaid Other | Source: Ambulatory Visit | Attending: Cardiovascular Disease | Admitting: Cardiovascular Disease

## 2019-07-18 ENCOUNTER — Other Ambulatory Visit: Payer: Self-pay

## 2019-07-18 ENCOUNTER — Telehealth: Payer: Self-pay | Admitting: Physician Assistant

## 2019-07-18 DIAGNOSIS — R079 Chest pain, unspecified: Secondary | ICD-10-CM | POA: Insufficient documentation

## 2019-07-18 LAB — NM MYOCAR MULTI W/SPECT W/WALL MOTION / EF
Estimated workload: 1 METS
LV dias vol: 109 mL (ref 62–150)
LV sys vol: 38 mL
MPHR: 161 {beats}/min
Peak HR: 82 {beats}/min
Percent HR: 50 %
RATE: 0.35
Rest HR: 66 {beats}/min
TID: 1.19

## 2019-07-18 MED ORDER — TECHNETIUM TC 99M TETROFOSMIN IV KIT
33.0000 | PACK | Freq: Once | INTRAVENOUS | Status: AC | PRN
  Administered 2019-07-18: 33 via INTRAVENOUS

## 2019-07-18 MED ORDER — REGADENOSON 0.4 MG/5ML IV SOLN
0.4000 mg | Freq: Once | INTRAVENOUS | Status: AC
  Administered 2019-07-18: 0.4 mg via INTRAVENOUS

## 2019-07-18 MED ORDER — REGADENOSON 0.4 MG/5ML IV SOLN
INTRAVENOUS | Status: AC
  Filled 2019-07-18: qty 5

## 2019-07-18 MED ORDER — TECHNETIUM TC 99M TETROFOSMIN IV KIT
10.2000 | PACK | Freq: Once | INTRAVENOUS | Status: AC | PRN
  Administered 2019-07-18: 10.2 via INTRAVENOUS

## 2019-07-18 NOTE — Telephone Encounter (Signed)
Patient presented to [facility] hospital for outpatient myoview ordered by his PCP Dr. Brigitte Pulse. He was previous evaluated by [facility] cardiologist in 2020 and had treadmill myoview and coronary CT. Coronary CT in Dec 2020 showed <25% lesion in mid LAD, but otherwise no significant CAD.   He completed lexiscan myoview without significant complication. Pending final report by [facility] reader.

## 2019-07-18 NOTE — Telephone Encounter (Signed)
   There was no ST segment deviation noted during stress.  The study is normal.  This is a low risk study.  Nuclear stress EF: 65%.  The left ventricular ejection fraction is normal (55-65%).   1. This is a normal study without ischemia or infarction.  2. Normal LVEF, 65%.  3. This is a low-risk study.    I have called and informed Cristian Fuller the test result.

## 2019-10-18 ENCOUNTER — Encounter: Payer: Self-pay | Admitting: Cardiovascular Disease

## 2019-10-18 ENCOUNTER — Ambulatory Visit (INDEPENDENT_AMBULATORY_CARE_PROVIDER_SITE_OTHER): Payer: Medicaid Other | Admitting: Cardiovascular Disease

## 2019-10-18 ENCOUNTER — Other Ambulatory Visit: Payer: Self-pay

## 2019-10-18 ENCOUNTER — Telehealth: Payer: Self-pay | Admitting: Cardiovascular Disease

## 2019-10-18 VITALS — BP 120/72 | HR 62 | Ht 67.0 in | Wt 155.2 lb

## 2019-10-18 DIAGNOSIS — R0789 Other chest pain: Secondary | ICD-10-CM

## 2019-10-18 DIAGNOSIS — Z01812 Encounter for preprocedural laboratory examination: Secondary | ICD-10-CM

## 2019-10-18 NOTE — H&P (View-Only) (Signed)
10/18/2019 Cristian Fuller   March 21, 1960  [id number]  Primary Physician Astrid Divine, MD Primary Cardiologist: Lorretta Harp MD Garret Reddish, [place], [place]  HPI:  Cristian Fuller is a 59 y.o. thin and fit appearing married [name] (from [place]) who is been in the country for over 30 years.  He is a father of one 31 year old daughter.  He works in Engineer, drilling.  His only risk factor is hyperlipidemia.  He has had ongoing exertional chest pressure and negative Myoview stress test 07/18/2019.  He was referred by his primary cardiologist, Dr. Mardene Speak, for diagnostic coronary angiography to define his anatomy and rule out ischemic etiology.   No outpatient medications have been marked as taking for the 10/18/19 encounter (Office Visit) with Lorretta Harp, MD.     No Known Allergies  Social History   Socioeconomic History  . Marital status: Married    Spouse name: Not on file  . Number of children: Not on file  . Years of education: Not on file  . Highest education level: Not on file  Occupational History  . Not on file  Tobacco Use  . Smoking status: Never Smoker  . Smokeless tobacco: Never Used  Substance and Sexual Activity  . Alcohol use: No  . Drug use: No  . Sexual activity: Not on file  Other Topics Concern  . Not on file  Social History Narrative   Occupation: Self employed   Married   Never Smoked    Alcohol use-no     Caffeine use/day:  2-3 times per week   Does Patient Exercise:  no   Social Determinants of Radio broadcast assistant Strain:   . Difficulty of Paying Living Expenses: Not on file  Food Insecurity:   . Worried About Charity fundraiser in the Last Year: Not on file  . Ran Out of Food in the Last Year: Not on file  Transportation Needs:   . Lack of Transportation (Medical): Not on file  . Lack of Transportation (Non-Medical): Not on file  Physical Activity:   . Days of Exercise per Week: Not on file  . Minutes of Exercise  per Session: Not on file  Stress:   . Feeling of Stress : Not on file  Social Connections:   . Frequency of Communication with Friends and Family: Not on file  . Frequency of Social Gatherings with Friends and Family: Not on file  . Attends Religious Services: Not on file  . Active Member of Clubs or Organizations: Not on file  . Attends Archivist Meetings: Not on file  . Marital Status: Not on file  Intimate Partner Violence:   . Fear of Current or Ex-Partner: Not on file  . Emotionally Abused: Not on file  . Physically Abused: Not on file  . Sexually Abused: Not on file     Review of Systems: General: negative for chills, fever, night sweats or weight changes.  Cardiovascular: negative for chest pain, dyspnea on exertion, edema, orthopnea, palpitations, paroxysmal nocturnal dyspnea or shortness of breath Dermatological: negative for rash Respiratory: negative for cough or wheezing Urologic: negative for hematuria Abdominal: negative for nausea, vomiting, diarrhea, bright red blood per rectum, melena, or hematemesis Neurologic: negative for visual changes, syncope, or dizziness All other systems reviewed and are otherwise negative except as noted above.    Blood pressure 120/72, pulse 62, height 5\' 7"  (1.702 m), weight 155 lb 3.2  oz (70.4 kg), SpO2 97 %.  General appearance: alert and no distress Neck: no adenopathy, no carotid bruit, no JVD, supple, symmetrical, trachea midline and thyroid not enlarged, symmetric, no tenderness/mass/nodules Lungs: clear to auscultation bilaterally Heart: regular rate and rhythm, S1, S2 normal, no murmur, click, rub or gallop Extremities: extremities normal, atraumatic, no cyanosis or edema Pulses: 2+ and symmetric Skin: Skin color, texture, turgor normal. No rashes or lesions Neurologic: Alert and oriented X 3, normal strength and tone. Normal symmetric reflexes. Normal coordination and gait  EKG sinus rhythm at 62 with incomplete  right bundle branch block.  I personally reviewed this EKG.  ASSESSMENT AND PLAN:   Atypical chest pain Mr. Attig was referred to me by Dr. Roland Rack, his cardiologist, for cardiac catheterization to define his anatomy and in the setting of ongoing atypical chest pain with a recent negative Myoview stress test performed 07/18/2019.  His only risk factor is hyperlipidemia.  He gets chest pain fairly reproducibly on exertion.  I have reviewed the risks, indications, and alternatives to cardiac catheterization, possible angioplasty, and stenting with the patient. Risks include but are not limited to bleeding, infection, vascular injury, stroke, myocardial infection, arrhythmia, kidney injury, radiation-related injury in the case of prolonged fluoroscopy use, emergency cardiac surgery, and death. The patient understands the risks of serious complication is 1-2 in 0349 with diagnostic cardiac cath and 1-2% or less with angioplasty/stenting.       Lorretta Harp MD FACP,FACC,FAHA, [facility] 10/18/2019 10:36 AM

## 2019-10-18 NOTE — Telephone Encounter (Signed)
Called and spoke to patient. He was calling to report that he is only taking crestor 5 mg QD and finasteride 5 mg QD. Patient's med list updated. Will send to RN/MD as FYI.

## 2019-10-18 NOTE — Assessment & Plan Note (Signed)
Mr. Chewning was referred to me by Dr. Roland Rack, his cardiologist, for cardiac catheterization to define his anatomy and in the setting of ongoing atypical chest pain with a recent negative Myoview stress test performed 07/18/2019.  His only risk factor is hyperlipidemia.  He gets chest pain fairly reproducibly on exertion.  I have reviewed the risks, indications, and alternatives to cardiac catheterization, possible angioplasty, and stenting with the patient. Risks include but are not limited to bleeding, infection, vascular injury, stroke, myocardial infection, arrhythmia, kidney injury, radiation-related injury in the case of prolonged fluoroscopy use, emergency cardiac surgery, and death. The patient understands the risks of serious complication is 1-2 in 0746 with diagnostic cardiac cath and 1-2% or less with angioplasty/stenting.

## 2019-10-18 NOTE — Progress Notes (Signed)
10/18/2019 Cristian Fuller   Apr 30, 1960  [id number]  Primary Physician Astrid Divine, MD Primary Cardiologist: Lorretta Harp MD Garret Reddish, [place], [place]  HPI:  Cristian Fuller is a 59 y.o. thin and fit appearing married [name] (from [place]) who is been in the country for over 30 years.  He is a father of one 70 year old daughter.  He works in Engineer, drilling.  His only risk factor is hyperlipidemia.  He has had ongoing exertional chest pressure and negative Myoview stress test 07/18/2019.  He was referred by his primary cardiologist, Dr. Mardene Speak, for diagnostic coronary angiography to define his anatomy and rule out ischemic etiology.   No outpatient medications have been marked as taking for the 10/18/19 encounter (Office Visit) with Lorretta Harp, MD.     No Known Allergies  Social History   Socioeconomic History  . Marital status: Married    Spouse name: Not on file  . Number of children: Not on file  . Years of education: Not on file  . Highest education level: Not on file  Occupational History  . Not on file  Tobacco Use  . Smoking status: Never Smoker  . Smokeless tobacco: Never Used  Substance and Sexual Activity  . Alcohol use: No  . Drug use: No  . Sexual activity: Not on file  Other Topics Concern  . Not on file  Social History Narrative   Occupation: Self employed   Married   Never Smoked    Alcohol use-no     Caffeine use/day:  2-3 times per week   Does Patient Exercise:  no   Social Determinants of Radio broadcast assistant Strain:   . Difficulty of Paying Living Expenses: Not on file  Food Insecurity:   . Worried About Charity fundraiser in the Last Year: Not on file  . Ran Out of Food in the Last Year: Not on file  Transportation Needs:   . Lack of Transportation (Medical): Not on file  . Lack of Transportation (Non-Medical): Not on file  Physical Activity:   . Days of Exercise per Week: Not on file  . Minutes of Exercise  per Session: Not on file  Stress:   . Feeling of Stress : Not on file  Social Connections:   . Frequency of Communication with Friends and Family: Not on file  . Frequency of Social Gatherings with Friends and Family: Not on file  . Attends Religious Services: Not on file  . Active Member of Clubs or Organizations: Not on file  . Attends Archivist Meetings: Not on file  . Marital Status: Not on file  Intimate Partner Violence:   . Fear of Current or Ex-Partner: Not on file  . Emotionally Abused: Not on file  . Physically Abused: Not on file  . Sexually Abused: Not on file     Review of Systems: General: negative for chills, fever, night sweats or weight changes.  Cardiovascular: negative for chest pain, dyspnea on exertion, edema, orthopnea, palpitations, paroxysmal nocturnal dyspnea or shortness of breath Dermatological: negative for rash Respiratory: negative for cough or wheezing Urologic: negative for hematuria Abdominal: negative for nausea, vomiting, diarrhea, bright red blood per rectum, melena, or hematemesis Neurologic: negative for visual changes, syncope, or dizziness All other systems reviewed and are otherwise negative except as noted above.    Blood pressure 120/72, pulse 62, height 5\' 7"  (1.702 m), weight 155 lb 3.2  oz (70.4 kg), SpO2 97 %.  General appearance: alert and no distress Neck: no adenopathy, no carotid bruit, no JVD, supple, symmetrical, trachea midline and thyroid not enlarged, symmetric, no tenderness/mass/nodules Lungs: clear to auscultation bilaterally Heart: regular rate and rhythm, S1, S2 normal, no murmur, click, rub or gallop Extremities: extremities normal, atraumatic, no cyanosis or edema Pulses: 2+ and symmetric Skin: Skin color, texture, turgor normal. No rashes or lesions Neurologic: Alert and oriented X 3, normal strength and tone. Normal symmetric reflexes. Normal coordination and gait  EKG sinus rhythm at 62 with incomplete  right bundle branch block.  I personally reviewed this EKG.  ASSESSMENT AND PLAN:   Atypical chest pain Mr. Kall was referred to me by Dr. Roland Rack, his cardiologist, for cardiac catheterization to define his anatomy and in the setting of ongoing atypical chest pain with a recent negative Myoview stress test performed 07/18/2019.  His only risk factor is hyperlipidemia.  He gets chest pain fairly reproducibly on exertion.  I have reviewed the risks, indications, and alternatives to cardiac catheterization, possible angioplasty, and stenting with the patient. Risks include but are not limited to bleeding, infection, vascular injury, stroke, myocardial infection, arrhythmia, kidney injury, radiation-related injury in the case of prolonged fluoroscopy use, emergency cardiac surgery, and death. The patient understands the risks of serious complication is 1-2 in 4599 with diagnostic cardiac cath and 1-2% or less with angioplasty/stenting.       Lorretta Harp MD FACP,FACC,FAHA, [facility] 10/18/2019 10:36 AM

## 2019-10-18 NOTE — Telephone Encounter (Signed)
New Message   Patient is calling because he was told that he needs to call and provide the name of the medications that he take.   Crestor 5mg  once a day Finasteride 5mg  once day

## 2019-10-18 NOTE — Patient Instructions (Addendum)
Medication Instructions:  CALL us WHEN YOU GET HOME WITH MEDICATION LIST  *If you need a refill on your cardiac medications before your next appointment, please call your pharmacy*   Lab Work: BMET, CBC today  If you have labs (blood work) drawn today and your tests are completely normal, you will receive your results only by: Marland Kitchen MyChart Message (if you have MyChart) OR . A paper copy in the mail If you have any lab test that is abnormal or we need to change your treatment, we will call you to review the results.   Testing/Procedures: Your physician has requested that you have a cardiac catheterization. Cardiac catheterization is used to diagnose and/or treat various heart conditions. Doctors may recommend this procedure for a number of different reasons. The most common reason is to evaluate chest pain. Chest pain can be a symptom of coronary artery disease (CAD), and cardiac catheterization can show whether plaque is narrowing or blocking your heart's arteries. This procedure is also used to evaluate the valves, as well as measure the blood flow and oxygen levels in different parts of your heart. For further information please visit [email or web address].[email or web address]. Please follow instruction sheet, as given.  Follow-Up: At [facility], you and your health needs are our priority.  As part of our continuing mission to provide you with exceptional heart care, we have created designated Provider Care Teams.  These Care Teams include your primary Cardiologist (physician) and Advanced Practice Providers (APPs -  Physician Assistants and Nurse Practitioners) who all work together to provide you with the care you need, when you need it.  We recommend signing up for the patient portal called "MyChart".  Sign up information is provided on this After Visit Summary.  MyChart is used to connect with patients for Virtual Visits (Telemedicine).  Patients are able to view lab/test results, encounter notes, upcoming  appointments, etc.  Non-urgent messages can be sent to your provider as well.   To learn more about what you can do with MyChart, go to [email or web address].    Your next appointment:   2 week(s) after cath  The format for your next appointment:   In Person  Provider:   Quay Burow, MD   Other Instructions    [address] [place] [address] Dept: [phone] Loc: [phone]  Arne Schlender  10/18/2019  You are scheduled for a Cardiac Catheterization on Monday, September 20 with Dr. Quay Burow.  1. Please arrive at the [facility] (Main Entrance A) at [facility]: [address] [place], [place] [address] at 9:00 AM (This time is two hours before your procedure to ensure your preparation). Free valet parking service is available.   Special note: Every effort is made to have your procedure done on time. Please understand that emergencies sometimes delay scheduled procedures.  2. Diet: Do not eat solid foods after midnight.  The patient may have clear liquids until 5am upon the day of the procedure.  3. Labs: TODAY  COVID TEST: Thursday 9/16 at  1:45 PM [place] [address]  4. Medication instructions in preparation for your procedure:   Contrast Allergy: No  I WILL CALL YOU TO REVIEW YOUR MEDICATION INSTRUCTIONS ONCE LIST IT RECEIVED.  On the morning of your procedure, take your Aspirin and any morning medicines NOT listed above.  You may use sips of water.  5. Plan for one night stay--bring  personal belongings. 6. Bring a current list of your medications and current insurance cards. 7. You MUST have a responsible person to drive you home. 8. Someone MUST be with you the first 24 hours after you arrive home or your discharge will be delayed. 9. Please wear clothes that are easy to get on and off and wear slip-on shoes.  Thank  you for allowing [place] to care for you!   -- [facility]

## 2019-10-19 ENCOUNTER — Other Ambulatory Visit: Payer: Self-pay | Admitting: *Deleted

## 2019-10-19 DIAGNOSIS — R0789 Other chest pain: Secondary | ICD-10-CM

## 2019-10-19 LAB — BASIC METABOLIC PANEL
BUN/Creatinine Ratio: 27 — ABNORMAL HIGH (ref 9–20)
BUN: 18 mg/dL (ref 6–24)
CO2: 24 mmol/L (ref 20–29)
Calcium: 9.6 mg/dL (ref 8.7–10.2)
Chloride: 105 mmol/L (ref 96–106)
Creatinine, Ser: 0.67 mg/dL — ABNORMAL LOW (ref 0.76–1.27)
GFR calc Af Amer: 122 mL/min/{1.73_m2} (ref >59)
GFR calc non Af Amer: 105 mL/min/{1.73_m2} (ref >59)
Glucose: 105 mg/dL — ABNORMAL HIGH (ref 65–99)
Potassium: 4.6 mmol/L (ref 3.5–5.2)
Sodium: 144 mmol/L (ref 134–144)

## 2019-10-19 LAB — CBC
Hematocrit: 44.1 % (ref 37.5–51.0)
Hemoglobin: 14.9 g/dL (ref 13.0–17.7)
MCH: 32.3 pg (ref 26.6–33.0)
MCHC: 33.8 g/dL (ref 31.5–35.7)
MCV: 96 fL (ref 79–97)
Platelets: 218 10*3/uL (ref 150–450)
RBC: 4.61 x10E6/uL (ref 4.14–5.80)
RDW: 11.9 % (ref 11.6–15.4)
WBC: 5.6 10*3/uL (ref 3.4–10.8)

## 2019-10-19 MED ORDER — SODIUM CHLORIDE 0.9% FLUSH: 3.0000 mL | Freq: Two times a day (BID) | INTRAVENOUS | Status: DC

## 2019-10-19 NOTE — Telephone Encounter (Signed)
Spoke to patient, aware no medications to hold prior to procedure.  He is aware to take ASA 81 mg AM of procedure.

## 2019-10-20 ENCOUNTER — Telehealth: Payer: Self-pay | Admitting: *Deleted

## 2019-10-20 ENCOUNTER — Other Ambulatory Visit (HOSPITAL_COMMUNITY)
Admission: RE | Admit: 2019-10-20 | Discharge: 2019-10-20 | Disposition: A | Discharge status: Home / Self Care | Payer: Medicaid Other | Source: Ambulatory Visit | Attending: Cardiovascular Disease | Admitting: Cardiovascular Disease

## 2019-10-20 DIAGNOSIS — Z20822 Contact with and (suspected) exposure to covid-19: Secondary | ICD-10-CM | POA: Insufficient documentation

## 2019-10-20 DIAGNOSIS — Z01812 Encounter for preprocedural laboratory examination: Secondary | ICD-10-CM | POA: Insufficient documentation

## 2019-10-20 LAB — SARS CORONAVIRUS 2 (TAT 6-24 HRS): SARS Coronavirus 2: NEGATIVE

## 2019-10-20 NOTE — Telephone Encounter (Signed)
Pt contacted pre-catheterization scheduled at [facility] for: Monday October 24, 2019 11 AM Verified arrival time and place: [facility]) at: 9 AM    No solid food after midnight prior to cath, clear liquids until 5 AM day of procedure.   AM meds can be  taken pre-cath with sips of water including: ASA 81 mg   Confirmed patient has responsible adult to drive home post procedure and be with patient first 24 hours after arriving home: yes  You are allowed ONE visitor in the waiting room during the time you are at the hospital for your procedure. Both you and your visitor must wear a mask once you enter the hospital.       COVID-19 Pre-Screening Questions:   In the past 10 days have you had a new cough, shortness of breath, headache, congestion, fever (100 or greater) unexplained body aches, new sore throat, or sudden loss of taste or sense of smell? no  In the past 10 days have you been around anyone with known Covid 19? no  Have you been vaccinated for COVID-19? Yes, see immunization history  Reviewed procedure/mask/vistor instructions, COVID-19 questions reviewed with patient.

## 2019-10-24 ENCOUNTER — Ambulatory Visit (HOSPITAL_COMMUNITY)
Admission: RE | Admit: 2019-10-24 | Discharge: 2019-10-24 | Disposition: A | Discharge status: Home / Self Care | Payer: Medicaid Other | Attending: Cardiovascular Disease | Admitting: Cardiovascular Disease

## 2019-10-24 ENCOUNTER — Other Ambulatory Visit: Payer: Self-pay

## 2019-10-24 ENCOUNTER — Encounter (HOSPITAL_COMMUNITY)
Admission: RE | Disposition: A | Discharge status: Home / Self Care | Payer: Self-pay | Source: Home / Self Care | Attending: Cardiovascular Disease

## 2019-10-24 DIAGNOSIS — R0789 Other chest pain: Secondary | ICD-10-CM

## 2019-10-24 DIAGNOSIS — I25119 Atherosclerotic heart disease of native coronary artery with unspecified angina pectoris: Secondary | ICD-10-CM | POA: Diagnosis not present

## 2019-10-24 DIAGNOSIS — E785 Hyperlipidemia, unspecified: Secondary | ICD-10-CM | POA: Diagnosis not present

## 2019-10-24 DIAGNOSIS — I25118 Atherosclerotic heart disease of native coronary artery with other forms of angina pectoris: Secondary | ICD-10-CM

## 2019-10-24 DIAGNOSIS — I209 Angina pectoris, unspecified: Secondary | ICD-10-CM

## 2019-10-24 HISTORY — PX: LEFT HEART CATH AND CORONARY ANGIOGRAPHY: CATH118249

## 2019-10-24 SURGERY — LEFT HEART CATH AND CORONARY ANGIOGRAPHY
Anesthesia: LOCAL

## 2019-10-24 MED ORDER — HEPARIN SODIUM (PORCINE) 1000 UNIT/ML IJ SOLN
INTRAMUSCULAR | Status: DC | PRN
  Administered 2019-10-24: 3500 [IU] via INTRAVENOUS

## 2019-10-24 MED ORDER — ACETAMINOPHEN 325 MG PO TABS
ORAL_TABLET | ORAL | Status: AC
  Filled 2019-10-24: qty 2

## 2019-10-24 MED ORDER — HEPARIN (PORCINE) IN NACL 1000-0.9 UT/500ML-% IV SOLN
INTRAVENOUS | Status: DC | PRN
  Administered 2019-10-24 (×2): 500 mL

## 2019-10-24 MED ORDER — SODIUM CHLORIDE 0.9 % IV SOLN: INTRAVENOUS | Status: AC

## 2019-10-24 MED ORDER — LABETALOL HCL 5 MG/ML IV SOLN: 10.0000 mg | INTRAVENOUS | Status: AC | PRN

## 2019-10-24 MED ORDER — LIDOCAINE HCL (PF) 1 % IJ SOLN
INTRAMUSCULAR | Status: DC | PRN
  Administered 2019-10-24: 2 mL

## 2019-10-24 MED ORDER — SODIUM CHLORIDE 0.9 % WEIGHT BASED INFUSION
3.0000 mL/kg/h | INTRAVENOUS | Status: AC
  Administered 2019-10-24: 3 mL/kg/h via INTRAVENOUS

## 2019-10-24 MED ORDER — ONDANSETRON HCL 4 MG/2ML IJ SOLN: 4.0000 mg | Freq: Four times a day (QID) | INTRAMUSCULAR | Status: DC | PRN

## 2019-10-24 MED ORDER — NITROGLYCERIN 1 MG/10 ML FOR IR/CATH LAB
INTRA_ARTERIAL | Status: AC
  Filled 2019-10-24: qty 10

## 2019-10-24 MED ORDER — IOHEXOL 350 MG/ML SOLN
INTRAVENOUS | Status: DC | PRN
  Administered 2019-10-24: 60 mL via INTRA_ARTERIAL

## 2019-10-24 MED ORDER — HEPARIN (PORCINE) IN NACL 1000-0.9 UT/500ML-% IV SOLN
INTRAVENOUS | Status: AC
  Filled 2019-10-24: qty 500

## 2019-10-24 MED ORDER — SODIUM CHLORIDE 0.9% FLUSH: 3.0000 mL | INTRAVENOUS | Status: DC | PRN

## 2019-10-24 MED ORDER — MORPHINE SULFATE (PF) 2 MG/ML IV SOLN: 2.0000 mg | INTRAVENOUS | Status: DC | PRN

## 2019-10-24 MED ORDER — SODIUM CHLORIDE 0.9 % IV SOLN: 250.0000 mL | INTRAVENOUS | Status: DC | PRN

## 2019-10-24 MED ORDER — SODIUM CHLORIDE 0.9 % WEIGHT BASED INFUSION: 1.0000 mL/kg/h | INTRAVENOUS | Status: DC

## 2019-10-24 MED ORDER — ASPIRIN 81 MG PO CHEW: 81.0000 mg | CHEWABLE_TABLET | ORAL | Status: DC

## 2019-10-24 MED ORDER — ACETAMINOPHEN 325 MG PO TABS
650.0000 mg | ORAL_TABLET | ORAL | Status: DC | PRN
  Administered 2019-10-24: 650 mg via ORAL

## 2019-10-24 MED ORDER — VERAPAMIL HCL 2.5 MG/ML IV SOLN
INTRA_ARTERIAL | Status: DC | PRN
  Administered 2019-10-24: 5 mL via INTRA_ARTERIAL

## 2019-10-24 MED ORDER — HEPARIN SODIUM (PORCINE) 1000 UNIT/ML IJ SOLN
INTRAMUSCULAR | Status: AC
  Filled 2019-10-24: qty 1

## 2019-10-24 MED ORDER — LIDOCAINE HCL (PF) 1 % IJ SOLN
INTRAMUSCULAR | Status: AC
  Filled 2019-10-24: qty 30

## 2019-10-24 MED ORDER — SODIUM CHLORIDE 0.9% FLUSH: 3.0000 mL | Freq: Two times a day (BID) | INTRAVENOUS | Status: DC

## 2019-10-24 MED ORDER — VERAPAMIL HCL 2.5 MG/ML IV SOLN
INTRAVENOUS | Status: AC
  Filled 2019-10-24: qty 2

## 2019-10-24 MED ORDER — HYDRALAZINE HCL 20 MG/ML IJ SOLN: 10.0000 mg | INTRAMUSCULAR | Status: AC | PRN

## 2019-10-24 MED ORDER — ASPIRIN 81 MG PO CHEW: 81.0000 mg | CHEWABLE_TABLET | Freq: Every day | ORAL | Status: DC

## 2019-10-24 SURGICAL SUPPLY — 10 items
CATH INFINITI 5FR ANG PIGTAIL (CATHETERS) ×2 IMPLANT
CATH OPTITORQUE TIG 4.0 5F (CATHETERS) ×2 IMPLANT
DEVICE RAD COMP TR BAND LRG (VASCULAR PRODUCTS) ×2 IMPLANT
GLIDESHEATH SLEND A-KIT 6F 22G (SHEATH) ×2 IMPLANT
KIT HEART LEFT (KITS) ×2 IMPLANT
PACK CARDIAC CATHETERIZATION (CUSTOM PROCEDURE TRAY) ×2 IMPLANT
SYR MEDRAD MARK 7 150ML (SYRINGE) ×2 IMPLANT
TRANSDUCER W/STOPCOCK (MISCELLANEOUS) ×2 IMPLANT
TUBING CIL FLEX 10 FLL-RA (TUBING) ×2 IMPLANT
WIRE HI TORQ VERSACORE-J 145CM (WIRE) ×2 IMPLANT

## 2019-10-24 NOTE — Interval H&P Note (Signed)
Cath Lab Visit (complete for each Cath Lab visit)  Clinical Evaluation Leading to the Procedure:   ACS: No.  Non-ACS:    Anginal Classification: CCS II  Anti-ischemic medical therapy: No Therapy  Non-Invasive Test Results: Low-risk stress test findings: cardiac mortality <1%/year  Prior CABG: No previous CABG      History and Physical Interval Note:  10/24/2019 10:21 AM  Cristian Fuller  has presented today for surgery, with the diagnosis of angina.  The various methods of treatment have been discussed with the patient and family. After consideration of risks, benefits and other options for treatment, the patient has consented to  Procedure(s): LEFT HEART CATH AND CORONARY ANGIOGRAPHY (N/A) as a surgical intervention.  The patient's history has been reviewed, patient examined, no change in status, stable for surgery.  I have reviewed the patient's chart and labs.  Questions were answered to the patient's satisfaction.     Quay Burow

## 2019-10-24 NOTE — Progress Notes (Signed)
Taking tr band off, bled, pressure held and band reapplied with 9 cc of air, level 0, VSS. Cath lab was contacted and assisted with replacement.

## 2019-10-24 NOTE — Discharge Instructions (Signed)
Radial Site Care  This sheet gives you information about how to care for yourself after your procedure. Your health care provider may also give you more specific instructions. If you have problems or questions, contact your health care provider. What can I expect after the procedure? After the procedure, it is common to have:  Bruising and tenderness at the catheter insertion area. Follow these instructions at home: Medicines  Take over-the-counter and prescription medicines only as told by your health care provider. Insertion site care  Follow instructions from your health care provider about how to take care of your insertion site. Make sure you: ? Wash your hands with soap and water before you change your bandage (dressing). If soap and water are not available, use hand sanitizer. ? Change your dressing as told by your health care provider. ? Leave stitches (sutures), skin glue, or adhesive strips in place. These skin closures may need to stay in place for 2 weeks or longer. If adhesive strip edges start to loosen and curl up, you may trim the loose edges. Do not remove adhesive strips completely unless your health care provider tells you to do that.  Check your insertion site every day for signs of infection. Check for: ? Redness, swelling, or pain. ? Fluid or blood. ? Pus or a bad smell. ? Warmth.  Do not take baths, swim, or use a hot tub until your health care provider approves.  You may shower 24-48 hours after the procedure, or as directed by your health care provider. ? Remove the dressing and gently wash the site with plain soap and water. ? Pat the area dry with a clean towel. ? Do not rub the site. That could cause bleeding.  Do not apply powder or lotion to the site. Activity   For 24 hours after the procedure, or as directed by your health care provider: ? Do not flex or bend the affected arm. ? Do not push or pull heavy objects with the affected arm. ? Do not  drive yourself home from the hospital or clinic. You may drive 24 hours after the procedure unless your health care provider tells you not to. ? Do not operate machinery or power tools.  Do not lift anything that is heavier than 10 lb (4.5 kg), or the limit that you are told, until your health care provider says that it is safe.  Ask your health care provider when it is okay to: ? Return to work or school. ? Resume usual physical activities or sports. ? Resume sexual activity. General instructions  If the catheter site starts to bleed, raise your arm and put firm pressure on the site. If the bleeding does not stop, get help right away. This is a medical emergency.  If you went home on the same day as your procedure, a responsible adult should be with you for the first 24 hours after you arrive home.  Keep all follow-up visits as told by your health care provider. This is important. Contact a health care provider if:  You have a fever.  You have redness, swelling, or yellow drainage around your insertion site. Get help right away if:  You have unusual pain at the radial site.  The catheter insertion area swells very fast.  The insertion area is bleeding, and the bleeding does not stop when you hold steady pressure on the area.  Your arm or hand becomes pale, cool, tingly, or numb. These symptoms may represent a serious problem   that is an emergency. Do not wait to see if the symptoms will go away. Get medical help right away. Call your local emergency services (911 in the [place] Do not drive yourself to the hospital. Summary  After the procedure, it is common to have bruising and tenderness at the site.  Follow instructions from your health care provider about how to take care of your radial site wound. Check the wound every day for signs of infection.  Do not lift anything that is heavier than 10 lb (4.5 kg), or the limit that you are told, until your health care provider says  that it is safe. This information is not intended to replace advice given to you by your health care provider. Make sure you discuss any questions you have with your health care provider. Document Revised: 02/25/2017 Document Reviewed: 02/25/2017 Elsevier Patient Education  2020 Elsevier Inc.  

## 2019-10-25 ENCOUNTER — Encounter (HOSPITAL_COMMUNITY): Payer: Self-pay | Admitting: Cardiovascular Disease

## 2019-11-11 ENCOUNTER — Encounter: Payer: Self-pay | Admitting: Cardiovascular Disease

## 2019-11-11 ENCOUNTER — Ambulatory Visit (INDEPENDENT_AMBULATORY_CARE_PROVIDER_SITE_OTHER): Payer: Medicaid Other | Admitting: Cardiovascular Disease

## 2019-11-11 ENCOUNTER — Other Ambulatory Visit: Payer: Self-pay

## 2019-11-11 DIAGNOSIS — R0789 Other chest pain: Secondary | ICD-10-CM | POA: Diagnosis not present

## 2019-11-11 NOTE — Progress Notes (Signed)
11/11/2019 Cristian Fuller   [birth year]  [id number]  Primary Physician Astrid Divine, MD Primary Cardiologist: Lorretta Harp MD Garret Reddish, [place], [place]  HPI:  Cristian Fuller is a 59 y.o.  thin and fit appearing married [name] (from [place]) who is been in the country for over 30 years.  He is a father of one 15 year old daughter.  He works in Engineer, drilling.  I last saw him in the office 10/18/2019. His only risk factor is hyperlipidemia.  He has had ongoing exertional chest pressure and negative Myoview stress test 07/18/2019.  He was referred by his primary cardiologist, Dr. Mardene Speak, for diagnostic coronary angiography to define his anatomy and rule out ischemic etiology.  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  Neck plan was to perform intervention only for recurrent symptoms despite medical therapy.  Unfortunately, his blood pressure is fairly soft not allowing initiation of antianginal medications although he is had no further symptoms since his heart catheterization.   Current Meds  Medication Sig   finasteride (PROSCAR) 5 MG tablet Take 5 mg by mouth daily.   rosuvastatin (CRESTOR) 5 MG tablet Take 5 mg by mouth daily.   Vitamin D, Ergocalciferol, (DRISDOL) 1.25 MG (50000 UNIT) CAPS capsule Take 50,000 Units by mouth once a week.   Current Facility-Administered Medications for the 11/11/19 encounter (Office Visit) with Lorretta Harp, MD  Medication   sodium chloride flush (NS) 0.9 % injection 3 mL     No Known Allergies  Social History   Socioeconomic History   Marital status: Married    Spouse name: Not on file   Number of children: Not on file   Years of education: Not on file   Highest education level: Not on file  Occupational History   Not on file  Tobacco Use   Smoking status: Never Smoker   Smokeless tobacco: Never Used   Substance and Sexual Activity   Alcohol use: No   Drug use: No   Sexual activity: Not on file  Other Topics Concern   Not on file  Social History Narrative   Occupation: Self employed   Married   Never Smoked    Alcohol use-no     Caffeine use/day:  2-3 times per week   Does Patient Exercise:  no   Social Determinants of Radio broadcast assistant Strain:    Difficulty of Paying Living Expenses: Not on file  Food Insecurity:    Worried About Charity fundraiser in the Last Year: Not on file   [employer] of Food in the Last Year: Not on file  Transportation Needs:    Lack of Transportation (Medical): Not on file   Lack of Transportation (Non-Medical): Not on file  Physical Activity:    Days of Exercise per Week: Not on file   Minutes of Exercise per Session: Not on file  Stress:    Feeling of Stress : Not on file  Social Connections:    Frequency of Communication with Friends and Family: Not on file   Frequency of Social Gatherings with Friends and Family: Not on file   Attends Religious Services: Not on file   Active Member of Clubs or Organizations: Not on file   Attends Archivist Meetings: Not on file   Marital Status: Not on file  Intimate Partner  Violence:    Fear of Current or Ex-Partner: Not on file   Emotionally Abused: Not on file   Physically Abused: Not on file   Sexually Abused: Not on file     Review of Systems: General: negative for chills, fever, night sweats or weight changes.  Cardiovascular: negative for chest pain, dyspnea on exertion, edema, orthopnea, palpitations, paroxysmal nocturnal dyspnea or shortness of breath Dermatological: negative for rash Respiratory: negative for cough or wheezing Urologic: negative for hematuria Abdominal: negative for nausea, vomiting, diarrhea, bright red blood per rectum, melena, or hematemesis Neurologic: negative for visual changes, syncope, or dizziness All other systems  reviewed and are otherwise negative except as noted above.    Blood pressure 100/64, pulse 73, height 5\' 7"  (1.702 m), weight 153 lb (69.4 kg), SpO2 97 %.  General appearance: alert and no distress Neck: no adenopathy, no carotid bruit, no JVD, supple, symmetrical, trachea midline and thyroid not enlarged, symmetric, no tenderness/mass/nodules Lungs: clear to auscultation bilaterally Heart: regular rate and rhythm, S1, S2 normal, no murmur, click, rub or gallop Extremities: extremities normal, atraumatic, no cyanosis or edema Pulses: 2+ and symmetric Skin: Skin color, texture, turgor normal. No rashes or lesions Neurologic: Alert and oriented X 3, normal strength and tone. Normal symmetric reflexes. Normal coordination and gait  EKG not performed today  ASSESSMENT AND PLAN:   Atypical chest pain Mr. Braley was referred to me by Dr. Brigitte Pulse for atypical chest pain with negative Myoview.  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  He had no other significant CAD.  My plan was initially medical therapy with plans to perform intervention only for recalcitrant symptoms.  I went over these with Dr. Brigitte Pulse.  He has had no recurrent symptoms since his cardiac catheterization.  His right radial puncture site is well-healed.      Lorretta Harp MD FACP,FACC,FAHA, [facility] 11/11/2019 10:39 AM

## 2019-11-11 NOTE — Assessment & Plan Note (Signed)
Cristian Fuller was referred to me by Dr. Brigitte Pulse for atypical chest pain with negative Myoview.  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  He had no other significant CAD.  My plan was initially medical therapy with plans to perform intervention only for recalcitrant symptoms.  I went over these with Dr. Brigitte Pulse.  He has had no recurrent symptoms since his cardiac catheterization.  His right radial puncture site is well-healed.

## 2019-11-11 NOTE — Patient Instructions (Signed)

## 2020-07-17 ENCOUNTER — Ambulatory Visit (INDEPENDENT_AMBULATORY_CARE_PROVIDER_SITE_OTHER): Payer: Medicaid Other | Admitting: Sports Medicine

## 2020-07-17 ENCOUNTER — Ambulatory Visit (INDEPENDENT_AMBULATORY_CARE_PROVIDER_SITE_OTHER): Payer: Medicaid Other

## 2020-07-17 ENCOUNTER — Other Ambulatory Visit: Payer: Self-pay

## 2020-07-17 DIAGNOSIS — M7712 Lateral epicondylitis, left elbow: Secondary | ICD-10-CM | POA: Diagnosis not present

## 2020-07-17 DIAGNOSIS — M25511 Pain in right shoulder: Secondary | ICD-10-CM | POA: Diagnosis not present

## 2020-07-17 DIAGNOSIS — M7541 Impingement syndrome of right shoulder: Secondary | ICD-10-CM

## 2020-07-17 DIAGNOSIS — R202 Paresthesia of skin: Secondary | ICD-10-CM

## 2020-07-17 DIAGNOSIS — R200 Anesthesia of skin: Secondary | ICD-10-CM

## 2020-07-17 DIAGNOSIS — G5603 Carpal tunnel syndrome, bilateral upper limbs: Secondary | ICD-10-CM | POA: Insufficient documentation

## 2020-07-17 DIAGNOSIS — M542 Cervicalgia: Secondary | ICD-10-CM

## 2020-07-17 DIAGNOSIS — G8929 Other chronic pain: Secondary | ICD-10-CM | POA: Insufficient documentation

## 2020-07-17 MED ORDER — GABAPENTIN 300 MG PO CAPS: ORAL_CAPSULE | ORAL | 3 refills | Status: DC

## 2020-07-17 MED ORDER — MELOXICAM 15 MG PO TABS: ORAL_TABLET | ORAL | 3 refills | Status: DC

## 2020-07-17 NOTE — Assessment & Plan Note (Signed)
Pain at the common extensor tendon origin, does a lot of tree cutting work. Counterforce brace, meloxicam, physical therapy. If insufficient improvement after 6 weeks we will do a tennis elbow injection at a follow-up visit.

## 2020-07-17 NOTE — Assessment & Plan Note (Signed)
Unclear etiology, minimally positive Tinel's on the right but his numbness and tingling is predominantly on the radial half of his fourth finger. Certainly this could represent carpal tunnel syndrome, cubital tunnel syndrome, or cervical radiculopathy. I am going to add Neurontin to take 300 mg at night for now, we will work this up more aggressively at future visits.

## 2020-07-17 NOTE — Assessment & Plan Note (Signed)
Months of pain in the right shoulder, worse with abduction, impingement signs on exam. Waking him from sleep, subacromial injection today, x-rays, formal PT per Return to see me in 6 weeks.

## 2020-07-17 NOTE — Progress Notes (Signed)
    Procedures performed today:    Procedure: Real-time Ultrasound Guided injection of the right subacromial bursa Device: Samsung HS60  Verbal informed consent obtained.  Time-out conducted.  Noted no overlying erythema, induration, or other signs of local infection.  Skin prepped in a sterile fashion.  Local anesthesia: Topical Ethyl chloride.  With sterile technique and under real time ultrasound guidance: Noted intact cuff, 1 cc Kenalog 40, 1 cc lidocaine, 1 cc bupivacaine injected easily Completed without difficulty  Advised to call if fevers/chills, erythema, induration, drainage, or persistent bleeding.  Images permanently stored and available for review in PACS.  Impression: Technically successful ultrasound guided injection.  Independent interpretation of notes and tests performed by another provider:   None.  Brief History, Exam, Impression, and Recommendations:    Impingement syndrome, shoulder, right Months of pain in the right shoulder, worse with abduction, impingement signs on exam. Waking him from sleep, subacromial injection today, x-rays, formal PT per Return to see me in 6 weeks.  Left tennis elbow Pain at the common extensor tendon origin, does a lot of tree cutting work. Counterforce brace, meloxicam, physical therapy. If insufficient improvement after 6 weeks we will do a tennis elbow injection at a follow-up visit.  Numbness and tingling in both hands Unclear etiology, minimally positive Tinel's on the right but his numbness and tingling is predominantly on the radial half of his fourth finger. Certainly this could represent carpal tunnel syndrome, cubital tunnel syndrome, or cervical radiculopathy. I am going to add Neurontin to take 300 mg at night for now, we will work this up more aggressively at future visits.    ___________________________________________ Gwen Her. Dianah Field, M.D., ABFM., CAQSM. Primary Care and [place] Instructor of [place] [facility]

## 2020-07-26 ENCOUNTER — Other Ambulatory Visit: Payer: Self-pay

## 2020-07-26 ENCOUNTER — Ambulatory Visit: Payer: Medicaid Other | Attending: Sports Medicine | Admitting: Physical Therapy

## 2020-07-26 DIAGNOSIS — M25522 Pain in left elbow: Secondary | ICD-10-CM | POA: Diagnosis present

## 2020-07-26 DIAGNOSIS — M25611 Stiffness of right shoulder, not elsewhere classified: Secondary | ICD-10-CM | POA: Diagnosis present

## 2020-07-26 DIAGNOSIS — R293 Abnormal posture: Secondary | ICD-10-CM | POA: Diagnosis present

## 2020-07-26 DIAGNOSIS — M6281 Muscle weakness (generalized): Secondary | ICD-10-CM | POA: Insufficient documentation

## 2020-07-26 DIAGNOSIS — M25511 Pain in right shoulder: Secondary | ICD-10-CM | POA: Insufficient documentation

## 2020-07-26 NOTE — Therapy (Signed)
[facility] [address]  [place], [place], [address] Phone: [phone]   Fax:  [phone]  Physical Therapy Evaluation  Patient Details  Name: Cristian Fuller MRN: [id number] Date of Birth: [birth year] Referring Provider (PT): Aundria Mems, MD   Encounter Date: 07/26/2020   PT End of Session - 07/26/20 1539     Visit Number 1    Number of Visits 13    Date for PT Re-Evaluation 09/06/20    Authorization Type [employer]    Authorization - Visit Number 1    Authorization - Number of Visits 12    PT Start Time 1937    PT Stop Time 9024    PT Time Calculation (min) 43 min    Activity Tolerance Patient tolerated treatment well;Patient limited by pain    Behavior During Therapy [facility] for tasks assessed/performed             Past Medical History:  Diagnosis Date   Allergic rhinitis    Hyperlipidemia    Left varicocele    history of left varicocele    Past Surgical History:  Procedure Laterality Date   BACK SURGERY     ESOPHAGOGASTRODUODENOSCOPY  2002/2007   LEFT HEART CATH AND CORONARY ANGIOGRAPHY N/A 10/24/2019   Procedure: LEFT HEART CATH AND CORONARY ANGIOGRAPHY;  Surgeon: Lorretta Harp, MD;  Location: [facility];  Service: Cardiovascular;  Laterality: N/A;    There were no vitals filed for this visit.    Subjective Assessment - 07/26/20 1449     Subjective Patient reports 6 weeks of shoulder pain after working too much. 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 Pain is located over the R posterior and anterior shoulder. Worse with reaching overhead as if to take off a shirt, and reaching behind the back. Has an injection which helped. Notes N/T in B R>L fingers. Also notes some issues with his neck. L elbow pain is over the lateral epicondyle for the past 4 weeks. Worse when lifting with the palm down. Better with meds.    Pertinent History HLD, back surgery    Limitations  Lifting;House hold activities    Diagnostic tests 07/17/20 cervical xray: Prominent lower cervical spondylosis at C5-6 and C6-7, with symmetrical neural foraminal encroachment; 07/17/20 R shoulder xray: Unremarkable right shoulder    Patient Stated Goals decrease pain    Currently in Pain? Yes    Pain Score 9     Pain Location Shoulder    Pain Orientation Right;Anterior;Posterior    Pain Descriptors / Indicators Sharp    Pain Type Acute pain    Multiple Pain Sites Yes    Pain Score 6    Pain Location Elbow    Pain Orientation Left;Lateral    Pain Descriptors / Indicators Sore    Pain Type Acute pain                [facility] PT Assessment - 07/26/20 1457       Assessment   Medical Diagnosis R shoulder impingement    Referring Provider (PT) Aundria Mems, MD    Onset Date/Surgical Date 01/26/20    Hand Dominance Right    Next MD Visit 08/28/20    Prior Therapy no      Precautions   Precautions None      Balance Screen   Has the patient fallen in the past 6 months No    Has the patient had a  decrease in activity level because of a fear of falling?  No    Is the patient reluctant to leave their home because of a fear of falling?  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 [employer]    Leisure none      Cognition   Overall Cognitive Status [facility] [place] for tasks assessed      Coordination   Gross Motor Movements are Fluid and Coordinated Yes      Posture/Postural Control   Posture/Postural Control Postural limitations    Postural Limitations Rounded Shoulders;Forward head;Increased thoracic kyphosis      ROM / Strength   AROM / PROM /  Strength AROM;Strength      AROM   AROM Assessment Site Shoulder;Elbow;Wrist    Right/Left Shoulder Right;Left    Right Shoulder Flexion 104 Degrees   pain   Right Shoulder ABduction 90 Degrees   pain   Right Shoulder Internal Rotation --   FIR L5; pain   Right Shoulder External Rotation --   FER to back of head; pain   Left Shoulder Flexion 152 Degrees    Left Shoulder ABduction 147 Degrees    Left Shoulder Internal Rotation --   FIR T5   Left Shoulder External Rotation --   FER C7   Right/Left Elbow Right;Left    Right Elbow Flexion 140    Right Elbow Extension 0    Left Elbow Flexion 145    Left Elbow Extension -2    Right/Left Wrist Left    Right Wrist Extension 65 Degrees    Right Wrist Flexion 64 Degrees    Left Wrist Extension 70 Degrees    Left Wrist Flexion 62 Degrees      Strength   Strength Assessment Site Shoulder;Elbow;Wrist;Hand    Right/Left Shoulder Right;Left    Right Shoulder Flexion 4/5   pain   Right Shoulder ABduction 4/5    Right Shoulder Internal Rotation 4/5    Right Shoulder External Rotation 4/5   pain   Left Shoulder Flexion 4+/5    Left Shoulder ABduction 4+/5    Left Shoulder Internal Rotation 4/5    Left Shoulder External Rotation 4/5    Right/Left Elbow Right;Left    Right Elbow Flexion 4+/5    Right Elbow Extension 4+/5   R shoulderpain   Left Elbow Flexion 4+/5    Left Elbow Extension 4/5    Right/Left Wrist Right;Left    Right Wrist Flexion 4+/5    Right Wrist Extension 4+/5    Left Wrist Flexion 4+/5    Left Wrist Extension 4/5    Right/Left hand Right;Left    Right Hand Grip (lbs) 41.33   44, 39, 41   Left Hand Grip (lbs) 27.67   24, 20, 39; pain     Palpation   Palpation comment TTP and soft tissue restriction in R lateral deltoid, proximal biceps tendon, pec, L lateral wrist extensors and lateral epicondyle                        Objective measurements completed on examination: See above  findings.                PT Education - 07/26/20 1539     Education Details prognosis, POC, HEP; edu on benefits and use of counterforce brace    Person(s) Educated Patient    Methods Explanation;Demonstration;Tactile cues;Verbal cues;Handout    Comprehension Verbalized understanding;Returned demonstration              PT Short Term Goals - 07/26/20 1548       PT SHORT TERM GOAL #1   Title Patient to be independent with initial HEP.    Time 3    Period Weeks    Status New    Target Date 08/16/20               PT Long Term Goals - 07/26/20 1548       PT LONG TERM GOAL #1   Title Patient to be independent with advanced HEP.    Time 6    Period Weeks    Status New    Target Date 09/06/20      PT LONG TERM GOAL #2   Title Patient to demonstrate B UE strength >/=4+/5 and grip strength atleast 40lbs.    Time 6    Period Weeks    Status New    Target Date 09/06/20      PT LONG TERM GOAL #3   Title Patient to demonstrate R shoulder AROM WFL and without pain limiting.    Time 6    Period Weeks    Status New    Target Date 09/06/20      PT LONG TERM GOAL #4   Title Patient to demonstrate postural correction at rest and with activity for improved postural awareness.    Time 6    Period Weeks    Status New    Target Date 09/06/20      PT LONG TERM GOAL #5   Title Patient to report tolerance for work duties without pain limiting.    Time 6    Period Weeks    Status New    Target Date 09/06/20                    Plan - 07/26/20 1540     Clinical Impression Statement Patient is a 60 y/o M presenting to OPPT with c/o R acute insidious shoulder and L elbow pain for the past 6 weeks, and 4 weeks respectively. R shoulder pain occurs over the anterior and posterior aspect. Worse with reaching overhead and reaching behind the back. Elbow pain occurs over the lateral epicondyle and worse with lifting in pronation. Patient is a Microbiologist, requiring  heavy use of hands, and is limited in his tolerance for work duties d/t pain. Patient today presenting with rounded and forward head posture, limited and painful R shoulder AROM, decreased B wrist AROM, decreased R shoulder and L elbow/wrist/grip strength, and TTP and soft tissue restriction evident in R lateral deltoid, proximal biceps tendon, pec, L lateral wrist extensors and lateral epicondyle. Patient was educated on gentle stretching and ROM HEP as well as on use of counterforce brace- patient reported understanding. Would benefit from skilled PT services 2x/week for 6 weeks to address aforementioned impairments.    Personal Factors and Comorbidities Age;Comorbidity 2;Past/Current Experience;Profession;Time since onset of injury/illness/exacerbation    Comorbidities HLD, back surgery    Examination-Activity Limitations Bathing;Dressing;Transfers;Hygiene/Grooming;Lift;Reach Overhead    Examination-Participation Restrictions Cleaning;Shop;Community Activity;Driving;Yard Work;Laundry;Meal Prep;Occupation  Stability/Clinical Decision Making Stable/Uncomplicated    Clinical Decision Making Low    Rehab Potential Good    PT Frequency 2x / week    PT Duration 6 weeks    PT Treatment/Interventions ADLs/Self Care Home Management;Cryotherapy;Electrical Stimulation;Iontophoresis 4mg /ml Dexamethasone;Moist Heat;Therapeutic exercise;Therapeutic activities;Functional mobility training;Ultrasound;Neuromuscular re-education;Patient/family education;Manual techniques;Vasopneumatic Device;Taping;Energy conservation;Dry needling;Passive range of motion    PT Next Visit Plan reassess HEP; progress shoulder ROM, wrist stretching, UE strengthening    Consulted and Agree with Plan of Care Patient             Patient will benefit from skilled therapeutic intervention in order to improve the following deficits and impairments:  Increased edema, Decreased activity tolerance, Decreased strength, Increased fascial  restricitons, Impaired UE functional use, Pain, Increased muscle spasms, Improper body mechanics, Decreased range of motion, Impaired flexibility, Postural dysfunction  Visit Diagnosis: Acute pain of right shoulder  Stiffness of right shoulder, not elsewhere classified  Pain in left elbow  Abnormal posture  Muscle weakness (generalized)     Problem List Patient Active Problem List   Diagnosis Date Noted   Impingement syndrome, shoulder, right 07/17/2020   Left tennis elbow 07/17/2020   Numbness and tingling in both hands 07/17/2020   Atypical chest pain 10/18/2019   Hemoptysis 10/18/2012   Preventative health care 04/07/2011   NUMBNESS, HAND 12/27/2007   HYPERLIPIDEMIA 12/24/2006   ALLERGIC RHINITIS 12/24/2006   GERD 12/24/2006   TB SKIN TEST, POSITIVE 12/24/2006     Janene Harvey, PT, DPT 07/26/20 3:51 PM    [facility] [address]  [place], [place], [address] Phone: [phone]   Fax:  [phone]  Name: Cristian Fuller MRN: [id number] Date of Birth: [birth year]

## 2020-07-30 ENCOUNTER — Encounter: Payer: Self-pay | Admitting: Physical Therapy

## 2020-07-30 ENCOUNTER — Other Ambulatory Visit: Payer: Self-pay

## 2020-07-30 ENCOUNTER — Ambulatory Visit: Payer: Medicaid Other | Admitting: Physical Therapy

## 2020-07-30 DIAGNOSIS — M6281 Muscle weakness (generalized): Secondary | ICD-10-CM

## 2020-07-30 DIAGNOSIS — M25611 Stiffness of right shoulder, not elsewhere classified: Secondary | ICD-10-CM

## 2020-07-30 DIAGNOSIS — M25511 Pain in right shoulder: Secondary | ICD-10-CM | POA: Diagnosis not present

## 2020-07-30 DIAGNOSIS — R293 Abnormal posture: Secondary | ICD-10-CM

## 2020-07-30 DIAGNOSIS — M25522 Pain in left elbow: Secondary | ICD-10-CM

## 2020-07-30 NOTE — Therapy (Signed)
[facility] [address]  [place], [place], [address] Phone: [phone]   Fax:  [phone]  Physical Therapy Treatment  Patient Details  Name: Cristian Fuller MRN: [id number] Date of Birth: 1960/08/01 Referring Provider (PT): Aundria Mems, MD   Encounter Date: 07/30/2020   PT End of Session - 07/30/20 0844     Visit Number 2    Number of Visits 13    Date for PT Re-Evaluation 09/06/20    Authorization Type [employer]    Authorization - Visit Number 2    Authorization - Number of Visits 12    PT Start Time 0800    PT Stop Time 1941    PT Time Calculation (min) 43 min    Activity Tolerance Patient tolerated treatment well    Behavior During Therapy [facility] for tasks assessed/performed             Past Medical History:  Diagnosis Date   Allergic rhinitis    Hyperlipidemia    Left varicocele    history of left varicocele    Past Surgical History:  Procedure Laterality Date   BACK SURGERY     ESOPHAGOGASTRODUODENOSCOPY  2002/2007   LEFT HEART CATH AND CORONARY ANGIOGRAPHY N/A 10/24/2019   Procedure: LEFT HEART CATH AND CORONARY ANGIOGRAPHY;  Surgeon: Lorretta Harp, MD;  Location: [facility];  Service: Cardiovascular;  Laterality: N/A;    There were no vitals filed for this visit.   Subjective Assessment - 07/30/20 0803     Subjective Only performed rows from his HEP. Got a counterforce brace which helps a little.    Pertinent History HLD, back surgery    Diagnostic tests 07/17/20 cervical xray: Prominent lower cervical spondylosis at C5-6 and C6-7, with symmetrical neural foraminal encroachment; 07/17/20 R shoulder xray: Unremarkable right shoulder    Patient Stated Goals decrease pain    Currently in Pain? Yes    Pain Score 7     Pain Location Shoulder    Pain Orientation Right;Anterior;Posterior    Pain Descriptors / Indicators Sharp    Pain Type Acute pain    Pain Score 5     Pain Orientation Left;Lateral    Pain Descriptors / Indicators Sore    Pain Type Acute pain                               [facility] Adult PT Treatment/Exercise - 07/30/20 0001       Self-Care   Self-Care Other Self-Care Comments    Other Self-Care Comments  edu and practice using self-STM ball on wall to R UT      Exercises   Exercises Shoulder;Elbow;Wrist      Shoulder Exercises: Supine   External Rotation AAROM;Right;10 reps    External Rotation Limitations with wand; shouder in neutral    Flexion AAROM;Right;10 reps    Flexion Limitations with wand to tolerance   pain when lowering down; R arm elevated on pillow   ABduction AAROM;Right;10 reps    ABduction Limitations with wand to tolerance      Shoulder Exercises: Standing   Extension Strengthening;Both;Theraband    Theraband Level (Shoulder Extension) Level 2 (Red)    Extension Limitations cues for scap retraction    Row Strengthening;Both;10 reps    Theraband Level (Shoulder Row) Level 2 (Red)    Row Limitations cues to stop at neutral  Shoulder Exercises: Pulleys   Flexion 3 minutes    Flexion Limitations to tolerance    Scaption 3 minutes    Scaption Limitations to tolerance      Wrist Exercises   Other wrist exercises L wrist flexion/extension stretch 30"      Manual Therapy   Manual Therapy Soft tissue mobilization;Myofascial release    Manual therapy comments sitting    Soft tissue mobilization STM to R UT, LS, scalenes    Myofascial Release manual TPR to R UT, LS, scalenes   very tight and TTP                     PT Short Term Goals - 07/30/20 0848       PT SHORT TERM GOAL #1   Title Patient to be independent with initial HEP.    Time 3    Period Weeks    Status On-going    Target Date 08/16/20               PT Long Term Goals - 07/30/20 0848       PT LONG TERM GOAL #1   Title Patient to be independent with advanced HEP.    Time 6    Period Weeks     Status On-going      PT LONG TERM GOAL #2   Title Patient to demonstrate B UE strength >/=4+/5 and grip strength atleast 40lbs.    Time 6    Period Weeks    Status On-going      PT LONG TERM GOAL #3   Title Patient to demonstrate R shoulder AROM WFL and without pain limiting.    Time 6    Period Weeks    Status On-going      PT LONG TERM GOAL #4   Title Patient to demonstrate postural correction at rest and with activity for improved postural awareness.    Time 6    Period Weeks    Status On-going      PT LONG TERM GOAL #5   Title Patient to report tolerance for work duties without pain limiting.    Time 6    Period Weeks    Status On-going                   Plan - 07/30/20 0844     Clinical Impression Statement Patient arrived to session with report of limited compliance with HEP. Does report small improvement from use of counterforce brace, which he is wearing today. Reviewed HEP for max understanding and carryover. Patient reported R shoulder pain upon descending arm down to pillow from supine flexion. Minor corrections required for form throughout. Periscapular strengthening was performed with good overall form, however patient with c/o R shoulder pain after these exercises. Addressed pain and soft tissue restriction with MT to R UT, LS, and scalenes which were very tight and TTP. Patient reported good benefit from MT, thus educated him on self-STM technique for management at home. No complaints at end of session.    Comorbidities HLD, back surgery    PT Treatment/Interventions ADLs/Self Care Home Management;Cryotherapy;Electrical Stimulation;Iontophoresis 4mg /ml Dexamethasone;Moist Heat;Therapeutic exercise;Therapeutic activities;Functional mobility training;Ultrasound;Neuromuscular re-education;Patient/family education;Manual techniques;Vasopneumatic Device;Taping;Energy conservation;Dry needling;Passive range of motion    PT Next Visit Plan reassess HEP; progress  shoulder ROM, wrist stretching, UE strengthening    Consulted and Agree with Plan of Care Patient             Patient will benefit  from skilled therapeutic intervention in order to improve the following deficits and impairments:  Increased edema, Decreased activity tolerance, Decreased strength, Increased fascial restricitons, Impaired UE functional use, Pain, Increased muscle spasms, Improper body mechanics, Decreased range of motion, Impaired flexibility, Postural dysfunction  Visit Diagnosis: Acute pain of right shoulder  Stiffness of right shoulder, not elsewhere classified  Pain in left elbow  Abnormal posture  Muscle weakness (generalized)     Problem List Patient Active Problem List   Diagnosis Date Noted   Impingement syndrome, shoulder, right 07/17/2020   Left tennis elbow 07/17/2020   Numbness and tingling in both hands 07/17/2020   Atypical chest pain 10/18/2019   Hemoptysis 10/18/2012   Preventative health care 04/07/2011   NUMBNESS, HAND 12/27/2007   HYPERLIPIDEMIA 12/24/2006   ALLERGIC RHINITIS 12/24/2006   GERD 12/24/2006   TB SKIN TEST, POSITIVE 12/24/2006    Janene Harvey, PT, DPT 07/30/20 8:50 AM   [facility] [address]  [place], [place], [address] Phone: [phone]   Fax:  [phone]  Name: Cristian Fuller MRN: [id number] Date of Birth: 03/05/1960

## 2020-08-02 ENCOUNTER — Ambulatory Visit: Payer: Medicaid Other

## 2020-08-09 ENCOUNTER — Other Ambulatory Visit: Payer: Self-pay

## 2020-08-09 ENCOUNTER — Ambulatory Visit: Payer: Medicaid Other | Attending: Sports Medicine

## 2020-08-09 DIAGNOSIS — M25511 Pain in right shoulder: Secondary | ICD-10-CM | POA: Insufficient documentation

## 2020-08-09 DIAGNOSIS — R293 Abnormal posture: Secondary | ICD-10-CM | POA: Diagnosis present

## 2020-08-09 DIAGNOSIS — M6281 Muscle weakness (generalized): Secondary | ICD-10-CM | POA: Insufficient documentation

## 2020-08-09 DIAGNOSIS — M25522 Pain in left elbow: Secondary | ICD-10-CM | POA: Diagnosis present

## 2020-08-09 DIAGNOSIS — M25611 Stiffness of right shoulder, not elsewhere classified: Secondary | ICD-10-CM | POA: Diagnosis present

## 2020-08-09 NOTE — Therapy (Signed)
[facility] [address]  [place], [place], [address] Phone: [phone]   Fax:  [phone]  Physical Therapy Treatment  Patient Details  Name: Cristian Fuller MRN: [id number] Date of Birth: Jul 09, 1960 Referring Provider (PT): Aundria Mems, MD   Encounter Date: 08/09/2020   PT End of Session - 08/09/20 0932     Visit Number 3    Number of Visits 13    Date for PT Re-Evaluation 09/06/20    Authorization Type [employer]    Authorization - Visit Number 3    Authorization - Number of Visits 12    PT Start Time 6948    PT Stop Time 0931    PT Time Calculation (min) 45 min    Activity Tolerance Patient tolerated treatment well    Behavior During Therapy [facility] for tasks assessed/performed             Past Medical History:  Diagnosis Date   Allergic rhinitis    Hyperlipidemia    Left varicocele    history of left varicocele    Past Surgical History:  Procedure Laterality Date   BACK SURGERY     ESOPHAGOGASTRODUODENOSCOPY  2002/2007   LEFT HEART CATH AND CORONARY ANGIOGRAPHY N/A 10/24/2019   Procedure: LEFT HEART CATH AND CORONARY ANGIOGRAPHY;  Surgeon: Lorretta Harp, MD;  Location: [place] [facility];  Service: Cardiovascular;  Laterality: N/A;    There were no vitals filed for this visit.   Subjective Assessment - 08/09/20 0848     Subjective Feeling better today. No concerns about HEP.    Pertinent History HLD, back surgery    Diagnostic tests 07/17/20 cervical xray: Prominent lower cervical spondylosis at C5-6 and C6-7, with symmetrical neural foraminal encroachment; 07/17/20 R shoulder xray: Unremarkable right shoulder    Patient Stated Goals decrease pain    Currently in Pain? Yes    Pain Score 7     Pain Location Shoulder    Pain Orientation Right;Anterior;Posterior    Pain Descriptors / Indicators Sharp    Pain Type Acute pain                                [facility] Adult PT Treatment/Exercise - 08/09/20 0001       Self-Care   Self-Care Other Self-Care Comments    Other Self-Care Comments  edu on avoiding aggrevating activites and excessive OH movement      Shoulder Exercises: Supine   External Rotation AAROM;Right;10 reps    External Rotation Limitations with wand    Flexion AAROM;Both;10 reps    Flexion Limitations with wand to tolerance    ABduction AAROM;Right;10 reps    ABduction Limitations with wand to tolerance      Shoulder Exercises: Standing   Horizontal ABduction Strengthening;Both;20 reps;Theraband    Theraband Level (Shoulder Horizontal ABduction) Level 2 (Red)    Horizontal ABduction Limitations pool noodle for tactile cues    Row Strengthening;Both;10 reps;Theraband    Theraband Level (Shoulder Row) Level 2 (Red)    Row Limitations pool noodle for TC      Shoulder Exercises: Pulleys   Flexion 3 minutes    Flexion Limitations to tolerance    Scaption 3 minutes    Scaption Limitations to tolerance      Manual Therapy   Manual Therapy Passive ROM;Joint mobilization    Manual therapy comments supine    Joint  Mobilization PA/inf glides grade II for pain    Passive ROM R shoulder gentle with slight distraction                      PT Short Term Goals - 07/30/20 0848       PT SHORT TERM GOAL #1   Title Patient to be independent with initial HEP.    Time 3    Period Weeks    Status On-going    Target Date 08/16/20               PT Long Term Goals - 07/30/20 0848       PT LONG TERM GOAL #1   Title Patient to be independent with advanced HEP.    Time 6    Period Weeks    Status On-going      PT LONG TERM GOAL #2   Title Patient to demonstrate B UE strength >/=4+/5 and grip strength atleast 40lbs.    Time 6    Period Weeks    Status On-going      PT LONG TERM GOAL #3   Title Patient to demonstrate R shoulder AROM WFL and without pain limiting.    Time 6    Period Weeks    Status  On-going      PT LONG TERM GOAL #4   Title Patient to demonstrate postural correction at rest and with activity for improved postural awareness.    Time 6    Period Weeks    Status On-going      PT LONG TERM GOAL #5   Title Patient to report tolerance for work duties without pain limiting.    Time 6    Period Weeks    Status On-going                   Plan - 08/09/20 1028     Clinical Impression Statement Pt demonstrated a fair performance of the exercises. He had pain with the supine exercises at ~50 deg of flexion. Did some PROM and joint mobs today for pain. Pt reported the manual work really decreased his pain and was able to raise his R arm into flexion in supine with less pain. Incorporated pool noodle today for TC to increase scap engagement during exercises. Also advised him to avoid things that increase pain in the R shoulder and excessive OH movements. Pt responded well.    Personal Factors and Comorbidities Age;Comorbidity 2;Past/Current Experience;Profession;Time since onset of injury/illness/exacerbation    Comorbidities HLD, back surgery    PT Frequency 2x / week    PT Duration 6 weeks    PT Treatment/Interventions ADLs/Self Care Home Management;Cryotherapy;Electrical Stimulation;Iontophoresis 4mg /ml Dexamethasone;Moist Heat;Therapeutic exercise;Therapeutic activities;Functional mobility training;Ultrasound;Neuromuscular re-education;Patient/family education;Manual techniques;Vasopneumatic Device;Taping;Energy conservation;Dry needling;Passive range of motion    PT Next Visit Plan progress shoulder ROM, wrist stretching, UE strengthening    Consulted and Agree with Plan of Care Patient             Patient will benefit from skilled therapeutic intervention in order to improve the following deficits and impairments:  Increased edema, Decreased activity tolerance, Decreased strength, Increased fascial restricitons, Impaired UE functional use, Pain, Increased  muscle spasms, Improper body mechanics, Decreased range of motion, Impaired flexibility, Postural dysfunction  Visit Diagnosis: Acute pain of right shoulder  Stiffness of right shoulder, not elsewhere classified  Pain in left elbow  Abnormal posture  Muscle weakness (generalized)     Problem List Patient  Active Problem List   Diagnosis Date Noted   Impingement syndrome, shoulder, right 07/17/2020   Left tennis elbow 07/17/2020   Numbness and tingling in both hands 07/17/2020   Atypical chest pain 10/18/2019   Hemoptysis 10/18/2012   Preventative health care 04/07/2011   NUMBNESS, HAND 12/27/2007   HYPERLIPIDEMIA 12/24/2006   ALLERGIC RHINITIS 12/24/2006   GERD 12/24/2006   TB SKIN TEST, POSITIVE 12/24/2006    Artist Pais, PTA 08/09/2020, 10:29 AM  [facility] [address]  [place], [place], [address] Phone: [phone]   Fax:  [phone]  Name: Easton Fetty MRN: [id number] Date of Birth: March 11, 1960

## 2020-08-13 ENCOUNTER — Other Ambulatory Visit: Payer: Self-pay

## 2020-08-13 ENCOUNTER — Ambulatory Visit: Payer: Medicaid Other | Admitting: Rehabilitative and Restorative Service Providers"

## 2020-08-13 DIAGNOSIS — M25611 Stiffness of right shoulder, not elsewhere classified: Secondary | ICD-10-CM

## 2020-08-13 DIAGNOSIS — M6281 Muscle weakness (generalized): Secondary | ICD-10-CM

## 2020-08-13 DIAGNOSIS — R293 Abnormal posture: Secondary | ICD-10-CM

## 2020-08-13 DIAGNOSIS — M25511 Pain in right shoulder: Secondary | ICD-10-CM

## 2020-08-13 DIAGNOSIS — M25522 Pain in left elbow: Secondary | ICD-10-CM

## 2020-08-13 NOTE — Therapy (Signed)
[facility] [address]  [place], [place], [address] Phone: [phone]   Fax:  [phone]  Physical Therapy Treatment  Patient Details  Name: Cristian Fuller MRN: [id number] Date of Birth: October 14, 1960 Referring Provider (PT): Aundria Mems, MD   Encounter Date: 08/13/2020   PT End of Session - 08/13/20 0812     Visit Number 4    Number of Visits 13    Date for PT Re-Evaluation 09/06/20    Authorization Type [employer]    Authorization - Visit Number 4    Authorization - Number of Visits 12    PT Start Time 0800    PT Stop Time 0840    PT Time Calculation (min) 40 min    Activity Tolerance Patient tolerated treatment well    Behavior During Therapy [facility] for tasks assessed/performed             Past Medical History:  Diagnosis Date   Allergic rhinitis    Hyperlipidemia    Left varicocele    history of left varicocele    Past Surgical History:  Procedure Laterality Date   BACK SURGERY     ESOPHAGOGASTRODUODENOSCOPY  2002/2007   LEFT HEART CATH AND CORONARY ANGIOGRAPHY N/A 10/24/2019   Procedure: LEFT HEART CATH AND CORONARY ANGIOGRAPHY;  Surgeon: Lorretta Harp, MD;  Location: [place] [facility];  Service: Cardiovascular;  Laterality: N/A;    There were no vitals filed for this visit.   Subjective Assessment - 08/13/20 0811     Subjective I am okay    Patient Stated Goals decrease pain    Currently in Pain? Yes    Pain Score 6     Pain Location Shoulder    Pain Orientation Right    Pain Descriptors / Indicators Aching    Pain Score 5    Pain Location Elbow    Pain Orientation Left    Pain Descriptors / Indicators Sore                [facility] PT Assessment - 08/13/20 0001       AROM   Right Shoulder Flexion 155 Degrees   in supine   Right Shoulder ABduction 135 Degrees   in supine                          [facility] Adult PT Treatment/Exercise - 08/13/20  0001       Shoulder Exercises: Supine   External Rotation AAROM;Right;10 reps    External Rotation Limitations with wand    Flexion AAROM;Both;10 reps    Flexion Limitations with wand to tolerance    ABduction AAROM;Right;10 reps    ABduction Limitations with wand to tolerance      Shoulder Exercises: Seated   Other Seated Exercises Bicep curls 2# 2x10 bilat., Chest press 2# x10 bilat.      Shoulder Exercises: Standing   Horizontal ABduction Strengthening;Both;20 reps;Theraband    Theraband Level (Shoulder Horizontal ABduction) Level 2 (Red)    Extension Strengthening;Both;20 reps    Theraband Level (Shoulder Extension) Level 2 (Red)    Row Strengthening;Both;20 reps    Theraband Level (Shoulder Row) Level 2 (Red)      Shoulder Exercises: ROM/Strengthening   UBE (Upper Arm Bike) L1.0 x81mn each way    Other ROM/Strengthening Exercises Scapular stabilization with ball on wall x1 min up/down, x1 min R/L      Manual Therapy  Manual Therapy Soft tissue mobilization;Myofascial release    Manual therapy comments supine    Soft tissue mobilization STM to R UT, delts, infraspinatus    Myofascial Release manual TPR to R upper trap and delt region    Passive ROM R shoulder ROM is [facility]                      PT Short Term Goals - 08/13/20 0859       PT SHORT TERM GOAL #1   Title Patient to be independent with initial HEP.    Status Partially Met               PT Long Term Goals - 08/13/20 0859       PT LONG TERM GOAL #1   Title Patient to be independent with advanced HEP.    Status On-going      PT LONG TERM GOAL #3   Title Patient to demonstrate R shoulder AROM WFL and without pain limiting.    Status Partially Met      PT LONG TERM GOAL #4   Title Patient to demonstrate postural correction at rest and with activity for improved postural awareness.    Status On-going      PT LONG TERM GOAL #5   Title Patient to report tolerance for work duties without  pain limiting.    Status On-going                   Plan - 08/13/20 0851     Clinical Impression Statement Patient tolerated session well and he is making great progress with ROM.  He does continue to have pain with ROM, but reports that it is only 2 spots where it catches.  He has improved on posture, but did require occasional cuing.  He was able to perform AROM in sitting without weight.  He did well with new shoulder and scapular stabilization exercises.  He continues to require skilled PT to progress towards goal related activities.    PT Treatment/Interventions ADLs/Self Care Home Management;Cryotherapy;Electrical Stimulation;Iontophoresis 91m/ml Dexamethasone;Moist Heat;Therapeutic exercise;Therapeutic activities;Functional mobility training;Ultrasound;Neuromuscular re-education;Patient/family education;Manual techniques;Vasopneumatic Device;Taping;Energy conservation;Dry needling;Passive range of motion    PT Next Visit Plan progress shoulder ROM, wrist stretching, UE strengthening    Consulted and Agree with Plan of Care Patient             Patient will benefit from skilled therapeutic intervention in order to improve the following deficits and impairments:  Increased edema, Decreased activity tolerance, Decreased strength, Increased fascial restricitons, Impaired UE functional use, Pain, Increased muscle spasms, Improper body mechanics, Decreased range of motion, Impaired flexibility, Postural dysfunction  Visit Diagnosis: Acute pain of right shoulder  Stiffness of right shoulder, not elsewhere classified  Pain in left elbow  Abnormal posture  Muscle weakness (generalized)     Problem List Patient Active Problem List   Diagnosis Date Noted   Impingement syndrome, shoulder, right 07/17/2020   Left tennis elbow 07/17/2020   Numbness and tingling in both hands 07/17/2020   Atypical chest pain 10/18/2019   Hemoptysis 10/18/2012   Preventative health care  04/07/2011   NUMBNESS, HAND 12/27/2007   HYPERLIPIDEMIA 12/24/2006   ALLERGIC RHINITIS 12/24/2006   GERD 12/24/2006   TB SKIN TEST, POSITIVE 12/24/2006    SJuel Burrow PT, DPT 08/13/2020, 9:01 AM  [facility] [address] [place] [address]Phone: [phone]  Fax:  [phone]  Name: Keonta Alsip MRN: [id number] Date of Birth: [birth year]

## 2020-08-16 ENCOUNTER — Ambulatory Visit: Payer: Medicaid Other

## 2020-08-16 ENCOUNTER — Other Ambulatory Visit: Payer: Self-pay

## 2020-08-16 DIAGNOSIS — M25611 Stiffness of right shoulder, not elsewhere classified: Secondary | ICD-10-CM

## 2020-08-16 DIAGNOSIS — R293 Abnormal posture: Secondary | ICD-10-CM

## 2020-08-16 DIAGNOSIS — M6281 Muscle weakness (generalized): Secondary | ICD-10-CM

## 2020-08-16 DIAGNOSIS — M25511 Pain in right shoulder: Secondary | ICD-10-CM

## 2020-08-16 DIAGNOSIS — M25522 Pain in left elbow: Secondary | ICD-10-CM

## 2020-08-16 NOTE — Therapy (Signed)
[facility] [address]  [place], [place], [address] Phone: [phone]   Fax:  [phone]  Physical Therapy Treatment  Patient Details  Name: Cristian Fuller MRN: [id number] Date of Birth: 06-Jun-1960 Referring Provider (PT): Aundria Mems, MD   Encounter Date: 08/16/2020   PT End of Session - 08/16/20 0928     Visit Number 5    Number of Visits 13    Date for PT Re-Evaluation 09/06/20    Authorization Type [employer]    Authorization - Visit Number 5    Authorization - Number of Visits 12    PT Start Time 0109    PT Stop Time 0925    PT Time Calculation (min) 41 min    Activity Tolerance Patient tolerated treatment well;Patient limited by pain    Behavior During Therapy [facility] for tasks assessed/performed             Past Medical History:  Diagnosis Date   Allergic rhinitis    Hyperlipidemia    Left varicocele    history of left varicocele    Past Surgical History:  Procedure Laterality Date   BACK SURGERY     ESOPHAGOGASTRODUODENOSCOPY  2002/2007   LEFT HEART CATH AND CORONARY ANGIOGRAPHY N/A 10/24/2019   Procedure: LEFT HEART CATH AND CORONARY ANGIOGRAPHY;  Surgeon: Lorretta Harp, MD;  Location: [place] [facility];  Service: Cardiovascular;  Laterality: N/A;    There were no vitals filed for this visit.   Subjective Assessment - 08/16/20 0846     Subjective Pt has stopped taking medications yesterday.    Pertinent History HLD, back surgery    Diagnostic tests 07/17/20 cervical xray: Prominent lower cervical spondylosis at C5-6 and C6-7, with symmetrical neural foraminal encroachment; 07/17/20 R shoulder xray: Unremarkable right shoulder    Patient Stated Goals decrease pain    Currently in Pain? Yes    Pain Score 6     Pain Location Shoulder    Pain Orientation Right    Pain Descriptors / Indicators Aching    Pain Type Acute pain                                [facility] Adult PT Treatment/Exercise - 08/16/20 0001       Shoulder Exercises: Supine   Flexion AROM;Right;10 reps;Weights    Shoulder Flexion Weight (lbs) 1    ABduction AROM;Right;10 reps;Weights    Shoulder ABduction Weight (lbs) 1      Shoulder Exercises: Standing   External Rotation Strengthening;Right;10 reps;Theraband    Theraband Level (Shoulder External Rotation) Level 2 (Red)    Internal Rotation Strengthening;Right;10 reps;Theraband    Theraband Level (Shoulder Internal Rotation) Level 2 (Red)    Extension Strengthening;Both;20 reps;Theraband    Theraband Level (Shoulder Extension) Level 3 (Green)    Row Strengthening;Both;20 reps;Theraband    Theraband Level (Shoulder Row) Level 3 (Green)      Shoulder Exercises: ROM/Strengthening   UBE (Upper Arm Bike) L2.0 x 3 min each way      Manual Therapy   Manual Therapy Soft tissue mobilization;Myofascial release;Passive ROM    Manual therapy comments supine    Soft tissue mobilization STM to R UT, levator, rhomboids, proximal bicep, ant deltoid    Myofascial Release manual TPR to R UT, levator, rhomboids, proximal bicep, ant deltoid    Passive ROM R shoulder flexion and ABD  PT Education - 08/16/20 0927     Education Details HEP update: Access Code: [email or web address]    Person(s) Educated Patient    Methods Explanation;Demonstration;Tactile cues;Verbal cues;Handout    Comprehension Verbalized understanding;Returned demonstration;Verbal cues required;Tactile cues required              PT Short Term Goals - 08/16/20 0928       PT SHORT TERM GOAL #1   Title Patient to be independent with initial HEP.    Status Achieved               PT Long Term Goals - 08/16/20 0940       PT LONG TERM GOAL #1   Title Patient to be independent with advanced HEP.    Time 6    Period Weeks    Status Partially Met   met for current     PT LONG TERM GOAL #2    Title Patient to demonstrate B UE strength >/=4+/5 and grip strength atleast 40lbs.    Time 6    Period Weeks    Status On-going      PT LONG TERM GOAL #3   Title Patient to demonstrate R shoulder AROM WFL and without pain limiting.    Time 6    Period Weeks    Status On-going      PT LONG TERM GOAL #4   Title Patient to demonstrate postural correction at rest and with activity for improved postural awareness.    Time 6    Period Weeks    Status On-going      PT LONG TERM GOAL #5   Title Patient to report tolerance for work duties without pain limiting.    Time 6    Period Weeks    Status On-going                   Plan - 08/16/20 0930     Clinical Impression Statement Pt reported pain with ER motions today during session. Due to this we focused on ER isometric step outs with TB but he was able to complete concentric IR with resistance. We progressed his HEP today to focus on periscap strengthening and RTC strengthening. He denied the need to review his intial HEP- STG 1 met. Found multiple tender points in the proximal biceps, ant deltoid, and UT. He would continue to benefit from more STM and TPR to reduce pain along with periscap strengthening.    Personal Factors and Comorbidities Age;Comorbidity 2;Past/Current Experience;Profession;Time since onset of injury/illness/exacerbation    Comorbidities HLD, back surgery    PT Frequency 2x / week    PT Duration 6 weeks    PT Treatment/Interventions ADLs/Self Care Home Management;Cryotherapy;Electrical Stimulation;Iontophoresis 4mg /ml Dexamethasone;Moist Heat;Therapeutic exercise;Therapeutic activities;Functional mobility training;Ultrasound;Neuromuscular re-education;Patient/family education;Manual techniques;Vasopneumatic Device;Taping;Energy conservation;Dry needling;Passive range of motion    PT Next Visit Plan periscap/RTC strengthening; STM for pain relief    Consulted and Agree with Plan of Care Patient              Patient will benefit from skilled therapeutic intervention in order to improve the following deficits and impairments:  Increased edema, Decreased activity tolerance, Decreased strength, Increased fascial restricitons, Impaired UE functional use, Pain, Increased muscle spasms, Improper body mechanics, Decreased range of motion, Impaired flexibility, Postural dysfunction  Visit Diagnosis: Acute pain of right shoulder  Stiffness of right shoulder, not elsewhere classified  Pain in left elbow  Abnormal posture  Muscle weakness (generalized)     Problem  List Patient Active Problem List   Diagnosis Date Noted   Impingement syndrome, shoulder, right 07/17/2020   Left tennis elbow 07/17/2020   Numbness and tingling in both hands 07/17/2020   Atypical chest pain 10/18/2019   Hemoptysis 10/18/2012   Preventative health care 04/07/2011   NUMBNESS, HAND 12/27/2007   HYPERLIPIDEMIA 12/24/2006   ALLERGIC RHINITIS 12/24/2006   GERD 12/24/2006   TB SKIN TEST, POSITIVE 12/24/2006    Artist Pais, PTA 08/16/2020, 9:40 AM  [facility] [address]  [place], [place], [address] Phone: [phone]   Fax:  [phone]  Name: Cristian Fuller MRN: [id number] Date of Birth: 1960-09-03

## 2020-08-20 ENCOUNTER — Encounter: Payer: Self-pay | Admitting: Physical Therapy

## 2020-08-20 ENCOUNTER — Ambulatory Visit: Payer: Medicaid Other | Admitting: Physical Therapy

## 2020-08-20 ENCOUNTER — Other Ambulatory Visit: Payer: Self-pay

## 2020-08-20 DIAGNOSIS — R293 Abnormal posture: Secondary | ICD-10-CM

## 2020-08-20 DIAGNOSIS — M25511 Pain in right shoulder: Secondary | ICD-10-CM

## 2020-08-20 DIAGNOSIS — M25611 Stiffness of right shoulder, not elsewhere classified: Secondary | ICD-10-CM

## 2020-08-20 DIAGNOSIS — M6281 Muscle weakness (generalized): Secondary | ICD-10-CM

## 2020-08-20 DIAGNOSIS — M25522 Pain in left elbow: Secondary | ICD-10-CM

## 2020-08-20 NOTE — Therapy (Signed)
[facility] [address]  [place], [place], [address] Phone: [phone]   Fax:  [phone]  Physical Therapy Treatment  Patient Details  Name: Cristian Fuller MRN: [id number] Date of Birth: 07/27/1960 Referring Provider (PT): Aundria Mems, MD   Encounter Date: 08/20/2020   PT End of Session - 08/20/20 0842     Visit Number 6    Number of Visits 13    Date for PT Re-Evaluation 09/06/20    Authorization Type [employer]    Authorization - Visit Number 6    Authorization - Number of Visits 12    PT Start Time 7903    PT Stop Time 0840    PT Time Calculation (min) 42 min    Activity Tolerance Patient tolerated treatment well;Patient limited by pain    Behavior During Therapy [facility] for tasks assessed/performed             Past Medical History:  Diagnosis Date   Allergic rhinitis    Hyperlipidemia    Left varicocele    history of left varicocele    Past Surgical History:  Procedure Laterality Date   BACK SURGERY     ESOPHAGOGASTRODUODENOSCOPY  2002/2007   LEFT HEART CATH AND CORONARY ANGIOGRAPHY N/A 10/24/2019   Procedure: LEFT HEART CATH AND CORONARY ANGIOGRAPHY;  Surgeon: Lorretta Harp, MD;  Location: [facility];  Service: Cardiovascular;  Laterality: N/A;    There were no vitals filed for this visit.   Subjective Assessment - 08/20/20 0759     Subjective Doing better. "Way better. I used to not be able to answer a phone and hold it with my ear but now I can." Gets the more benefit with massage.    Pertinent History HLD, back surgery    Diagnostic tests 07/17/20 cervical xray: Prominent lower cervical spondylosis at C5-6 and C6-7, with symmetrical neural foraminal encroachment; 07/17/20 R shoulder xray: Unremarkable right shoulder    Patient Stated Goals decrease pain    Currently in Pain? Yes    Pain Score 6     Pain Location Shoulder    Pain Orientation Right    Pain  Descriptors / Indicators Aching    Pain Type Acute pain                               [facility] Adult PT Treatment/Exercise - 08/20/20 0001       Shoulder Exercises: Supine   Horizontal ABduction Strengthening;Both;10 reps;Theraband    Theraband Level (Shoulder Horizontal ABduction) Level 1 (Yellow)    Horizontal ABduction Limitations limited ROM on R      Shoulder Exercises: Seated   External Rotation Strengthening;Both;10 reps;Theraband    Theraband Level (Shoulder External Rotation) Level 1 (Yellow)    External Rotation Weight (lbs) 2x10 within limited ROM on R      Shoulder Exercises: Pulleys   Flexion 3 minutes    Flexion Limitations to tolerance    Scaption 3 minutes    Scaption Limitations to tolerance      Shoulder Exercises: Stretch   Corner Stretch 2 reps;30 seconds    Corner Stretch Limitations trying at different angles for max comfort    Table Stretch - Flexion --   10x to tolerance with green pball   Table Stretch - Abduction --   10x to tolerance with green pball   Other Shoulder Stretches R shoulder ER  forward folds 10x      Manual Therapy   Manual Therapy Soft tissue mobilization;Myofascial release;Passive ROM    Manual therapy comments sitting    Soft tissue mobilization STM to R UT, LS, scalenes    Myofascial Release manual TPR to R UT, LS, scalenes   considerably sized trigger points at proximal LS and UT                     PT Short Term Goals - 08/16/20 1410       PT SHORT TERM GOAL #1   Title Patient to be independent with initial HEP.    Status Achieved               PT Long Term Goals - 08/16/20 0940       PT LONG TERM GOAL #1   Title Patient to be independent with advanced HEP.    Time 6    Period Weeks    Status Partially Met   met for current     PT LONG TERM GOAL #2   Title Patient to demonstrate B UE strength >/=4+/5 and grip strength atleast 40lbs.    Time 6    Period Weeks    Status On-going       PT LONG TERM GOAL #3   Title Patient to demonstrate R shoulder AROM WFL and without pain limiting.    Time 6    Period Weeks    Status On-going      PT LONG TERM GOAL #4   Title Patient to demonstrate postural correction at rest and with activity for improved postural awareness.    Time 6    Period Weeks    Status On-going      PT LONG TERM GOAL #5   Title Patient to report tolerance for work duties without pain limiting.    Time 6    Period Weeks    Status On-going                   Plan - 08/20/20 0843     Clinical Impression Statement Patient arrived to session with report of good improvement in pain since initial eval. Notes that he is better able to answer/use the phone and reports most benefit with massage. Reports that crossing his R arm over his chest into adduction is still painful but improved. Worked on gentle shoulder AAROM within patient's comfort. Patient reported most discomfort at end range flexion. Periscapular and posterior shoulder strengthening was performed within limited ROM d/t pain at end range ER. Addressed pain and muscular tightness with STM and TPR to R UT, LS, and scalenes. Patient demonstrated considerably sized painful trigger points in proximal LS and UT. Overall patient tolerated session very well and noted improvement in pain at end of session. Patient will continue to benefit from skilled PT services.    Personal Factors and Comorbidities Age;Comorbidity 2;Past/Current Experience;Profession;Time since onset of injury/illness/exacerbation    Comorbidities HLD, back surgery    PT Frequency 2x / week    PT Duration 6 weeks    PT Treatment/Interventions ADLs/Self Care Home Management;Cryotherapy;Electrical Stimulation;Iontophoresis 4mg /ml Dexamethasone;Moist Heat;Therapeutic exercise;Therapeutic activities;Functional mobility training;Ultrasound;Neuromuscular re-education;Patient/family education;Manual techniques;Vasopneumatic  Device;Taping;Energy conservation;Dry needling;Passive range of motion    PT Next Visit Plan periscap/RTC strengthening; STM for pain relief    Consulted and Agree with Plan of Care Patient             Patient will benefit from skilled therapeutic intervention  in order to improve the following deficits and impairments:  Increased edema, Decreased activity tolerance, Decreased strength, Increased fascial restricitons, Impaired UE functional use, Pain, Increased muscle spasms, Improper body mechanics, Decreased range of motion, Impaired flexibility, Postural dysfunction  Visit Diagnosis: Acute pain of right shoulder  Stiffness of right shoulder, not elsewhere classified  Pain in left elbow  Abnormal posture  Muscle weakness (generalized)     Problem List Patient Active Problem List   Diagnosis Date Noted   Impingement syndrome, shoulder, right 07/17/2020   Left tennis elbow 07/17/2020   Numbness and tingling in both hands 07/17/2020   Atypical chest pain 10/18/2019   Hemoptysis 10/18/2012   Preventative health care 04/07/2011   NUMBNESS, HAND 12/27/2007   HYPERLIPIDEMIA 12/24/2006   ALLERGIC RHINITIS 12/24/2006   GERD 12/24/2006   TB SKIN TEST, POSITIVE 12/24/2006     Janene Harvey, PT, DPT 08/20/20 8:44 AM    [facility] [address]  [place], [place], [address] Phone: [phone]   Fax:  [phone]  Name: Leo Fray MRN: [id number] Date of Birth: Oct 19, 1960

## 2020-08-23 ENCOUNTER — Ambulatory Visit: Payer: Medicaid Other

## 2020-08-23 ENCOUNTER — Other Ambulatory Visit: Payer: Self-pay

## 2020-08-23 DIAGNOSIS — M6281 Muscle weakness (generalized): Secondary | ICD-10-CM

## 2020-08-23 DIAGNOSIS — M25511 Pain in right shoulder: Secondary | ICD-10-CM | POA: Diagnosis not present

## 2020-08-23 DIAGNOSIS — M25611 Stiffness of right shoulder, not elsewhere classified: Secondary | ICD-10-CM

## 2020-08-23 DIAGNOSIS — R293 Abnormal posture: Secondary | ICD-10-CM

## 2020-08-23 DIAGNOSIS — M25522 Pain in left elbow: Secondary | ICD-10-CM

## 2020-08-23 NOTE — Therapy (Signed)
[facility] [address]  [place], [place], [address] Phone: [phone]   Fax:  [phone]  Physical Therapy Treatment  Patient Details  Name: Cristian Fuller MRN: [id number] Date of Birth: 12/27/1960 Referring Provider (PT): Aundria Mems, MD   Encounter Date: 08/23/2020   PT End of Session - 08/23/20 0847     Visit Number 7    Number of Visits 13    Date for PT Re-Evaluation 09/06/20    Authorization Type [employer]    Authorization - Visit Number 7    Authorization - Number of Visits 12    PT Start Time 0802    PT Stop Time 3664    PT Time Calculation (min) 53 min    Activity Tolerance Patient tolerated treatment well;Patient limited by pain    Behavior During Therapy [facility] for tasks assessed/performed             Past Medical History:  Diagnosis Date   Allergic rhinitis    Hyperlipidemia    Left varicocele    history of left varicocele    Past Surgical History:  Procedure Laterality Date   BACK SURGERY     ESOPHAGOGASTRODUODENOSCOPY  2002/2007   LEFT HEART CATH AND CORONARY ANGIOGRAPHY N/A 10/24/2019   Procedure: LEFT HEART CATH AND CORONARY ANGIOGRAPHY;  Surgeon: Lorretta Harp, MD;  Location: [facility];  Service: Cardiovascular;  Laterality: N/A;    There were no vitals filed for this visit.   Subjective Assessment - 08/23/20 0805     Subjective Only experiences pain with OH motions.    Pertinent History HLD, back surgery    Diagnostic tests 07/17/20 cervical xray: Prominent lower cervical spondylosis at C5-6 and C6-7, with symmetrical neural foraminal encroachment; 07/17/20 R shoulder xray: Unremarkable right shoulder    Patient Stated Goals decrease pain    Currently in Pain? Yes    Pain Score 2    5/10 with OH movement   Pain Location Shoulder    Pain Orientation Right    Pain Descriptors / Indicators Aching    Pain Type Chronic pain                                [facility] Adult PT Treatment/Exercise - 08/23/20 0001       Shoulder Exercises: Standing   External Rotation Strengthening;Right;10 reps;Theraband    Theraband Level (Shoulder External Rotation) Level 3 (Green)    External Rotation Limitations isometric step out    Internal Rotation Strengthening;Right;10 reps;Theraband    Theraband Level (Shoulder Internal Rotation) Level 3 (Green)    Extension Strengthening;Both;10 reps;Theraband    Theraband Level (Shoulder Extension) Level 3 (Green)      Shoulder Exercises: ROM/Strengthening   UBE (Upper Arm Bike) L2.0 x 3 min each way    Cybex Row Limitations 2x10 15#    Wall Wash into flexion,abduction 10x each with R UE; 5x crossbody    Wall Pushups 10 reps    Wall Pushups Limitations serratus pushups    Other ROM/Strengthening Exercises serratus slides on wall with pilow case 10x      Modalities   Modalities Moist Heat      Moist Heat Therapy   Number Minutes Moist Heat 10 Minutes    Moist Heat Location Shoulder;Cervical      Manual Therapy   Manual Therapy Soft tissue mobilization;Myofascial release    Manual  therapy comments supine    Soft tissue mobilization STM to R UT, LS, anterior deltoid    Myofascial Release manual TPR to R UT, LS, anterior delotid                      PT Short Term Goals - 08/16/20 3016       PT SHORT TERM GOAL #1   Title Patient to be independent with initial HEP.    Status Achieved               PT Long Term Goals - 08/16/20 0940       PT LONG TERM GOAL #1   Title Patient to be independent with advanced HEP.    Time 6    Period Weeks    Status Partially Met   met for current     PT LONG TERM GOAL #2   Title Patient to demonstrate B UE strength >/=4+/5 and grip strength atleast 40lbs.    Time 6    Period Weeks    Status On-going      PT LONG TERM GOAL #3   Title Patient to demonstrate R shoulder AROM WFL and without pain limiting.     Time 6    Period Weeks    Status On-going      PT LONG TERM GOAL #4   Title Patient to demonstrate postural correction at rest and with activity for improved postural awareness.    Time 6    Period Weeks    Status On-going      PT LONG TERM GOAL #5   Title Patient to report tolerance for work duties without pain limiting.    Time 6    Period Weeks    Status On-going                   Plan - 08/23/20 0849     Clinical Impression Statement Pt demonstrates improve awareness of posture at rest and with activity. He only experiences pain when trying to raise his R arm OH. We did gentle AROM exercises to his tolerance and he did experience the most pain with flexion combined with horizontal ADD. Pt still demonstrates mutiple tender points on the anterior portion of the shoulder and UT/LS area. Pain is relieved with STM and heat. Due to limited ER ROM we did isometric step outs to improve performance. Intermittent cues required with exercises to avoid over retraction and for proper performance. Ended session with moist heat to decrease pain and increase tissue extensibility.    Personal Factors and Comorbidities Age;Comorbidity 2;Past/Current Experience;Profession;Time since onset of injury/illness/exacerbation    Comorbidities HLD, back surgery    PT Frequency 2x / week    PT Duration 6 weeks    PT Treatment/Interventions ADLs/Self Care Home Management;Cryotherapy;Electrical Stimulation;Iontophoresis 4mg /ml Dexamethasone;Moist Heat;Therapeutic exercise;Therapeutic activities;Functional mobility training;Ultrasound;Neuromuscular re-education;Patient/family education;Manual techniques;Vasopneumatic Device;Taping;Energy conservation;Dry needling;Passive range of motion    PT Next Visit Plan periscap/RTC strengthening; STM for pain relief    Consulted and Agree with Plan of Care Patient             Patient will benefit from skilled therapeutic intervention in order to improve the  following deficits and impairments:  Increased edema, Decreased activity tolerance, Decreased strength, Increased fascial restricitons, Impaired UE functional use, Pain, Increased muscle spasms, Improper body mechanics, Decreased range of motion, Impaired flexibility, Postural dysfunction  Visit Diagnosis: Acute pain of right shoulder  Stiffness of right shoulder, not elsewhere classified  Pain in left elbow  Abnormal posture  Muscle weakness (generalized)     Problem List Patient Active Problem List   Diagnosis Date Noted   Impingement syndrome, shoulder, right 07/17/2020   Left tennis elbow 07/17/2020   Numbness and tingling in both hands 07/17/2020   Atypical chest pain 10/18/2019   Hemoptysis 10/18/2012   Preventative health care 04/07/2011   NUMBNESS, HAND 12/27/2007   HYPERLIPIDEMIA 12/24/2006   ALLERGIC RHINITIS 12/24/2006   GERD 12/24/2006   TB SKIN TEST, POSITIVE 12/24/2006    Artist Pais, PTA 08/23/2020, 9:54 AM  [facility] [address]  [place], [place], [address] Phone: [phone]   Fax:  [phone]  Name: Shaquelle Hernon MRN: [id number] Date of Birth: January 08, 1961

## 2020-08-27 ENCOUNTER — Ambulatory Visit: Payer: Medicaid Other | Admitting: Physical Therapy

## 2020-08-27 ENCOUNTER — Encounter: Payer: Self-pay | Admitting: Physical Therapy

## 2020-08-27 ENCOUNTER — Other Ambulatory Visit: Payer: Self-pay

## 2020-08-27 DIAGNOSIS — R293 Abnormal posture: Secondary | ICD-10-CM

## 2020-08-27 DIAGNOSIS — M25611 Stiffness of right shoulder, not elsewhere classified: Secondary | ICD-10-CM

## 2020-08-27 DIAGNOSIS — M25511 Pain in right shoulder: Secondary | ICD-10-CM | POA: Diagnosis not present

## 2020-08-27 DIAGNOSIS — M6281 Muscle weakness (generalized): Secondary | ICD-10-CM

## 2020-08-27 DIAGNOSIS — M25522 Pain in left elbow: Secondary | ICD-10-CM

## 2020-08-27 NOTE — Therapy (Signed)
[facility] [address]  [place], [place], [address] Phone: [phone]   Fax:  [phone]  Physical Therapy Treatment  Patient Details  Name: Cristian Fuller MRN: [id number] Date of Birth: [birth year] Referring Provider (PT): Aundria Mems, MD   Encounter Date: 08/27/2020   PT End of Session - 08/27/20 0844     Visit Number 8    Number of Visits 13    Date for PT Re-Evaluation 09/06/20    Authorization Type [employer]    Authorization - Visit Number 8    Authorization - Number of Visits 12    PT Start Time 0800    PT Stop Time 7867    PT Time Calculation (min) 53 min    Activity Tolerance Patient tolerated treatment well;Patient limited by pain    Behavior During Therapy [facility] for tasks assessed/performed             Past Medical History:  Diagnosis Date   Allergic rhinitis    Hyperlipidemia    Left varicocele    history of left varicocele    Past Surgical History:  Procedure Laterality Date   BACK SURGERY     ESOPHAGOGASTRODUODENOSCOPY  2002/2007   LEFT HEART CATH AND CORONARY ANGIOGRAPHY N/A 10/24/2019   Procedure: LEFT HEART CATH AND CORONARY ANGIOGRAPHY;  Surgeon: Lorretta Harp, MD;  Location: [facility];  Service: Cardiovascular;  Laterality: N/A;    There were no vitals filed for this visit.   Subjective Assessment - 08/27/20 0801     Subjective Bringing in personal TENS unit for edu and review. Has been using it with an adapter d/t the battery costing too much.    Pertinent History HLD, back surgery    Diagnostic tests 07/17/20 cervical xray: Prominent lower cervical spondylosis at C5-6 and C6-7, with symmetrical neural foraminal encroachment; 07/17/20 R shoulder xray: Unremarkable right shoulder    Patient Stated Goals decrease pain    Currently in Pain? Yes    Pain Score 5     Pain Location Shoulder    Pain Orientation Right    Pain Descriptors / Indicators  Aching    Pain Type Chronic pain                               [facility] Adult PT Treatment/Exercise - 08/27/20 0001       Shoulder Exercises: Supine   Protraction Strengthening;Both;10 reps;Weights    Protraction Weight (lbs) 3,5    Protraction Limitations 2x10; good form      Shoulder Exercises: Prone   Other Prone Exercises UE only bird dog x10   ROM limited on R d/t pain     Shoulder Exercises: Pulleys   Flexion 3 minutes    Flexion Limitations to tolerance    Scaption 3 minutes    Scaption Limitations to tolerance      Shoulder Exercises: Stretch   Other Shoulder Stretches R shoulder IR AAROM with SPC x15   with and withotu crossing midline; c/o pain but tolerable     Modalities   Modalities Vasopneumatic      Vasopneumatic   Number Minutes Vasopneumatic  10 minutes    Vasopnuematic Location  Shoulder   R   Vasopneumatic Pressure Low    Vasopneumatic Temperature  coldest  PT Education - 08/27/20 0805     Education Details edu on TENS use, precautions, safety information, and electrode placement; update to HEP    Person(s) Educated Patient    Methods Explanation;Demonstration;Tactile cues;Verbal cues;Handout    Comprehension Verbalized understanding;Returned demonstration              PT Short Term Goals - 08/16/20 6063       PT SHORT TERM GOAL #1   Title Patient to be independent with initial HEP.    Status Achieved               PT Long Term Goals - 08/16/20 0940       PT LONG TERM GOAL #1   Title Patient to be independent with advanced HEP.    Time 6    Period Weeks    Status Partially Met   met for current     PT LONG TERM GOAL #2   Title Patient to demonstrate B UE strength >/=4+/5 and grip strength atleast 40lbs.    Time 6    Period Weeks    Status On-going      PT LONG TERM GOAL #3   Title Patient to demonstrate R shoulder AROM WFL and without pain limiting.    Time 6    Period  Weeks    Status On-going      PT LONG TERM GOAL #4   Title Patient to demonstrate postural correction at rest and with activity for improved postural awareness.    Time 6    Period Weeks    Status On-going      PT LONG TERM GOAL #5   Title Patient to report tolerance for work duties without pain limiting.    Time 6    Period Weeks    Status On-going                   Plan - 08/27/20 0913     Clinical Impression Statement Patient arrived to session without new complaints. Brought in personal TENS unit for instruction on use and electrode placement. Took time to educate patient on TENS use, precautions, safety information, and electrode placement- patient reported understanding. Remainder of session focused on periscapular and RTC strengthening. Initiated quadruped ther-ex for core and scapular stability- ROM appeared limited on R d/t R shoulder pain. Also initiated shoulder IR AAROM d/t patient's c/o pain and difficulty when crossing midline. Patient was able to tolerate these without excessive pain today and reported understanding of these exercises into HEP. Ended session with [employer] to R shoulder for post-0exercise soreness. No complaints at end of session. Patient is progressing well towards goals.    Personal Factors and Comorbidities Age;Comorbidity 2;Past/Current Experience;Profession;Time since onset of injury/illness/exacerbation    Comorbidities HLD, back surgery    PT Frequency 2x / week    PT Duration 6 weeks    PT Treatment/Interventions ADLs/Self Care Home Management;Cryotherapy;Electrical Stimulation;Iontophoresis 75m/ml Dexamethasone;Moist Heat;Therapeutic exercise;Therapeutic activities;Functional mobility training;Ultrasound;Neuromuscular re-education;Patient/family education;Manual techniques;Vasopneumatic Device;Taping;Energy conservation;Dry needling;Passive range of motion    PT Next Visit Plan periscap/RTC strengthening; STM for pain relief    Consulted and  Agree with Plan of Care Patient             Patient will benefit from skilled therapeutic intervention in order to improve the following deficits and impairments:  Increased edema, Decreased activity tolerance, Decreased strength, Increased fascial restricitons, Impaired UE functional use, Pain, Increased muscle spasms, Improper body mechanics, Decreased range of motion, Impaired flexibility,  Postural dysfunction  Visit Diagnosis: Acute pain of right shoulder  Stiffness of right shoulder, not elsewhere classified  Pain in left elbow  Abnormal posture  Muscle weakness (generalized)     Problem List Patient Active Problem List   Diagnosis Date Noted   Impingement syndrome, shoulder, right 07/17/2020   Left tennis elbow 07/17/2020   Numbness and tingling in both hands 07/17/2020   Atypical chest pain 10/18/2019   Hemoptysis 10/18/2012   Preventative health care 04/07/2011   NUMBNESS, HAND 12/27/2007   HYPERLIPIDEMIA 12/24/2006   ALLERGIC RHINITIS 12/24/2006   GERD 12/24/2006   TB SKIN TEST, POSITIVE 12/24/2006     Janene Harvey, PT, DPT 08/27/20 9:16 AM    [facility] [address]  [place], [place], [address] Phone: [phone]   Fax:  [phone]  Name: Venice Liz MRN: [id number] Date of Birth: 11-11-1960

## 2020-08-27 NOTE — Patient Instructions (Addendum)

## 2020-08-28 ENCOUNTER — Ambulatory Visit (INDEPENDENT_AMBULATORY_CARE_PROVIDER_SITE_OTHER): Payer: Medicaid Other | Admitting: Sports Medicine

## 2020-08-28 DIAGNOSIS — R200 Anesthesia of skin: Secondary | ICD-10-CM

## 2020-08-28 DIAGNOSIS — M7541 Impingement syndrome of right shoulder: Secondary | ICD-10-CM | POA: Diagnosis not present

## 2020-08-28 DIAGNOSIS — R202 Paresthesia of skin: Secondary | ICD-10-CM | POA: Diagnosis not present

## 2020-08-28 MED ORDER — GABAPENTIN 300 MG PO CAPS: ORAL_CAPSULE | ORAL | 3 refills | Status: DC

## 2020-08-28 NOTE — Progress Notes (Signed)
    Procedures performed today:    None.  Independent interpretation of notes and tests performed by another provider:   None.  Brief History, Exam, Impression, and Recommendations:    Impingement syndrome, shoulder, right This is a pleasant 60 year old male, right shoulder impingement syndrome, injected subacromial bursa with ultrasound guidance at the last visit. He has had some formal physical therapy and reports approximately 50% improvement, has not yet plateaued so we will continue an additional 4 weeks of physical therapy before considering MRI for arthroscopy planning.  Numbness and tingling in both hands At initial visit there was an unclear etiology with regards to his bilateral hand numbness and tingling, predominantly middle and radial half of fourth finger. He did have a minimally positive Tinel sign on the right. It does not sound like he started his Neurontin, this is keeping him up at night, we will send in the prescription again, and revisit this in about a month. If insufficient improvement we will likely pull the trigger for nerve conduction and electromyography.    ___________________________________________ Gwen Her. Dianah Field, M.D., ABFM., CAQSM. Primary Care and [place] [employer] of [employer] [facility] [employer] [facility]

## 2020-08-28 NOTE — Assessment & Plan Note (Signed)
At initial visit there was an unclear etiology with regards to his bilateral hand numbness and tingling, predominantly middle and radial half of fourth finger. He did have a minimally positive Tinel sign on the right. It does not sound like he started his Neurontin, this is keeping him up at night, we will send in the prescription again, and revisit this in about a month. If insufficient improvement we will likely pull the trigger for nerve conduction and electromyography.

## 2020-08-28 NOTE — Assessment & Plan Note (Signed)
This is a pleasant 60 year old male, right shoulder impingement syndrome, injected subacromial bursa with ultrasound guidance at the last visit. He has had some formal physical therapy and reports approximately 50% improvement, has not yet plateaued so we will continue an additional 4 weeks of physical therapy before considering MRI for arthroscopy planning.

## 2020-08-30 ENCOUNTER — Other Ambulatory Visit: Payer: Self-pay

## 2020-08-30 ENCOUNTER — Ambulatory Visit: Payer: Medicaid Other

## 2020-08-30 DIAGNOSIS — M25511 Pain in right shoulder: Secondary | ICD-10-CM | POA: Diagnosis not present

## 2020-08-30 DIAGNOSIS — R293 Abnormal posture: Secondary | ICD-10-CM

## 2020-08-30 DIAGNOSIS — M6281 Muscle weakness (generalized): Secondary | ICD-10-CM

## 2020-08-30 DIAGNOSIS — M25522 Pain in left elbow: Secondary | ICD-10-CM

## 2020-08-30 DIAGNOSIS — M25611 Stiffness of right shoulder, not elsewhere classified: Secondary | ICD-10-CM

## 2020-08-30 NOTE — Therapy (Signed)
[facility] [address]  [place], [place], [address] Phone: [phone]   Fax:  [phone]  Physical Therapy Treatment  Patient Details  Name: Cristian Fuller MRN: [id number] Date of Birth: 12-29-1960 Referring Provider (PT): Aundria Mems, MD   Encounter Date: 08/30/2020   PT End of Session - 08/30/20 0845     Visit Number 9    Number of Visits 13    Date for PT Re-Evaluation 09/06/20    Authorization Type [employer]    Authorization - Visit Number 9    Authorization - Number of Visits 12    PT Start Time 0805    PT Stop Time 6440   10 min of moist heat   PT Time Calculation (min) 48 min    Activity Tolerance Patient tolerated treatment well;Patient limited by pain    Behavior During Therapy [facility] for tasks assessed/performed             Past Medical History:  Diagnosis Date   Allergic rhinitis    Hyperlipidemia    Left varicocele    history of left varicocele    Past Surgical History:  Procedure Laterality Date   BACK SURGERY     ESOPHAGOGASTRODUODENOSCOPY  2002/2007   LEFT HEART CATH AND CORONARY ANGIOGRAPHY N/A 10/24/2019   Procedure: LEFT HEART CATH AND CORONARY ANGIOGRAPHY;  Surgeon: Lorretta Harp, MD;  Location: [facility] [facility];  Service: Cardiovascular;  Laterality: N/A;    There were no vitals filed for this visit.   Subjective Assessment - 08/30/20 0806     Subjective Pt reports that MD wants to continue with PT for a month, then an MRI if no more improvement.    Pertinent History HLD, back surgery    Diagnostic tests 07/17/20 cervical xray: Prominent lower cervical spondylosis at C5-6 and C6-7, with symmetrical neural foraminal encroachment; 07/17/20 R shoulder xray: Unremarkable right shoulder    Patient Stated Goals decrease pain    Currently in Pain? Yes    Pain Score 5     Pain Location Shoulder    Pain Orientation Right    Pain Descriptors / Indicators Aching     Pain Type Chronic pain                [facility] PT Assessment - 08/30/20 0001       AROM   Right Shoulder Flexion 148 Degrees    Right Shoulder ABduction 145 Degrees                           [facility] Adult PT Treatment/Exercise - 08/30/20 0001       Shoulder Exercises: Supine   Protraction Strengthening;Right;20 reps;Weights    Protraction Weight (lbs) 3    Flexion Strengthening;Both;10 reps;Theraband    Theraband Level (Shoulder Flexion) Level 2 (Red)    Flexion Limitations B shoulder flexion with isometric ER    Other Supine Exercises CW/CCW circles with 2# weight 20x each way      Shoulder Exercises: Pulleys   Flexion 3 minutes    Flexion Limitations to tolerance    Scaption 3 minutes    Scaption Limitations to tolerance      Modalities   Modalities Moist Heat      Moist Heat Therapy   Number Minutes Moist Heat 10 Minutes    Moist Heat Location Shoulder;Cervical      Manual Therapy   Manual Therapy  Joint mobilization;Passive ROM    Joint Mobilization PA/AP/inf GH joint glides grades II-III    Passive ROM R shoulder all motions                      PT Short Term Goals - 08/16/20 8676       PT SHORT TERM GOAL #1   Title Patient to be independent with initial HEP.    Status Achieved               PT Long Term Goals - 08/30/20 1950       PT LONG TERM GOAL #1   Title Patient to be independent with advanced HEP.    Time 6    Period Weeks    Status Partially Met   met for current     PT LONG TERM GOAL #2   Title Patient to demonstrate B UE strength >/=4+/5 and grip strength atleast 40lbs.    Time 6    Period Weeks    Status On-going      PT LONG TERM GOAL #3   Title Patient to demonstrate R shoulder AROM WFL and without pain limiting.    Time 6    Period Weeks    Status On-going      PT LONG TERM GOAL #4   Title Patient to demonstrate postural correction at rest and with activity for improved postural awareness.     Time 6    Period Weeks    Status On-going      PT LONG TERM GOAL #5   Title Patient to report tolerance for work duties without pain limiting.    Time 6    Period Weeks    Status On-going                   Plan - 08/30/20 9326     Clinical Impression Statement Pt reports that after last session his shoulder did not respond well to the ice. He reported everything went well with the MD and he wants to continue with PT for a month before doing an MRI. Did joint mobs for the R shoulder today to decrease pain and increase ROM. Followed up MT with serratus ant strengthening to improve scap stability against the ribcage for less pain with OH movements. He did report increased pain with the flexion exercise due to muscle activity. R shoulder OH AROM remains around the same as last time we took measurements and mostly limited by pain. Pt responded well.    Personal Factors and Comorbidities Age;Comorbidity 2;Past/Current Experience;Profession;Time since onset of injury/illness/exacerbation    Comorbidities HLD, back surgery    PT Frequency 2x / week    PT Duration 6 weeks    PT Treatment/Interventions ADLs/Self Care Home Management;Cryotherapy;Electrical Stimulation;Iontophoresis 4mg /ml Dexamethasone;Moist Heat;Therapeutic exercise;Therapeutic activities;Functional mobility training;Ultrasound;Neuromuscular re-education;Patient/family education;Manual techniques;Vasopneumatic Device;Taping;Energy conservation;Dry needling;Passive range of motion    PT Next Visit Plan periscap/RTC strengthening; [place] joint mobs for pain and to increase ROM    Consulted and Agree with Plan of Care Patient             Patient will benefit from skilled therapeutic intervention in order to improve the following deficits and impairments:  Increased edema, Decreased activity tolerance, Decreased strength, Increased fascial restricitons, Impaired UE functional use, Pain, Increased muscle spasms, Improper body  mechanics, Decreased range of motion, Impaired flexibility, Postural dysfunction  Visit Diagnosis: Acute pain of right shoulder  Stiffness of right shoulder, not elsewhere classified  Pain in left elbow  Abnormal posture  Muscle weakness (generalized)     Problem List Patient Active Problem List   Diagnosis Date Noted   Impingement syndrome, shoulder, right 07/17/2020   Left tennis elbow 07/17/2020   Numbness and tingling in both hands 07/17/2020   Atypical chest pain 10/18/2019   Hemoptysis 10/18/2012   Preventative health care 04/07/2011   NUMBNESS, HAND 12/27/2007   HYPERLIPIDEMIA 12/24/2006   ALLERGIC RHINITIS 12/24/2006   GERD 12/24/2006   TB SKIN TEST, POSITIVE 12/24/2006    Artist Pais, PTA 08/30/2020, 9:42 AM  [facility] [address]  [place], [place], [address] Phone: [phone]   Fax:  [phone]  Name: Law Corsino MRN: [id number] Date of Birth: Jun 14, 1960

## 2020-09-05 ENCOUNTER — Ambulatory Visit: Payer: Medicaid Other | Attending: Sports Medicine

## 2020-09-05 ENCOUNTER — Other Ambulatory Visit: Payer: Self-pay

## 2020-09-05 DIAGNOSIS — M25522 Pain in left elbow: Secondary | ICD-10-CM | POA: Diagnosis present

## 2020-09-05 DIAGNOSIS — M6281 Muscle weakness (generalized): Secondary | ICD-10-CM | POA: Diagnosis present

## 2020-09-05 DIAGNOSIS — M25611 Stiffness of right shoulder, not elsewhere classified: Secondary | ICD-10-CM | POA: Insufficient documentation

## 2020-09-05 DIAGNOSIS — R293 Abnormal posture: Secondary | ICD-10-CM | POA: Insufficient documentation

## 2020-09-05 DIAGNOSIS — M25511 Pain in right shoulder: Secondary | ICD-10-CM | POA: Diagnosis present

## 2020-09-05 NOTE — Therapy (Addendum)
[facility] [address]  [place], [place], [address] Phone: [phone]   Fax:  [phone]  Progress Note Reporting Period 07/26/2020 to 09/05/2020  See note below for Objective Data and Assessment of Progress/Goals.   I have reviewed this patient's progress and current plan of care is appropriate.  Patient is making good progress towards all goals. He demonstrates improvements in R shoulder ROM to 157 deg flexion and 152 deg abduction, but still limited by pain with overhead movements.  His strength has also improved and has met strength goal except for R shoulder abduction is 4/5 limited by pain.  He would benefit from continued skilled therapy for 2x/week for additional 4 weeks in order to reach maximum potential and be able to perform work duties without pain.   Rennie Natter, PT, DPT 09/06/2020    Physical Therapy Treatment  Patient Details  Name: Cristian Fuller MRN: [id number] Date of Birth: July 22, 1960 Referring Provider (PT): Aundria Mems, MD   Encounter Date: 09/05/2020   PT End of Session - 09/05/20 1823     Visit Number 10    Number of Visits 18    Date for PT Re-Evaluation 09/06/20    Authorization Type [employer]    Authorization - Visit Number 10    Authorization - Number of Visits 12    PT Start Time 2836    PT Stop Time 1711    PT Time Calculation (min) 54 min    Activity Tolerance Patient tolerated treatment well;Patient limited by pain    Behavior During Therapy [facility] for tasks assessed/performed             Past Medical History:  Diagnosis Date   Allergic rhinitis    Hyperlipidemia    Left varicocele    history of left varicocele    Past Surgical History:  Procedure Laterality Date   BACK SURGERY     ESOPHAGOGASTRODUODENOSCOPY  2002/2007   LEFT HEART CATH AND CORONARY ANGIOGRAPHY N/A 10/24/2019   Procedure: LEFT HEART CATH AND CORONARY ANGIOGRAPHY;  Surgeon: Lorretta Harp, MD;  Location: [facility];  Service: Cardiovascular;  Laterality: N/A;    There were no vitals filed for this visit.   Subjective Assessment - 09/05/20 1619     Subjective Still having pain with OH movements, behind back, and crossbody.    Pertinent History HLD, back surgery    Diagnostic tests 07/17/20 cervical xray: Prominent lower cervical spondylosis at C5-6 and C6-7, with symmetrical neural foraminal encroachment; 07/17/20 R shoulder xray: Unremarkable right shoulder    Patient Stated Goals decrease pain    Currently in Pain? Yes    Pain Score 5     Pain Location Shoulder    Pain Orientation Right    Pain Descriptors / Indicators Aching    Pain Type Chronic pain                [facility] PT Assessment - 09/05/20 0001       AROM   Right Shoulder Flexion 157 Degrees    Right Shoulder ABduction 152 Degrees    Right Shoulder Internal Rotation --   FIR at T10   Right Shoulder External Rotation --   FER to T1     Strength   Right Shoulder Flexion 4+/5    Right Shoulder ABduction 4/5   limited by pain   Right Shoulder Internal Rotation 4+/5    Right Shoulder External Rotation  4+/5   pain   Left Shoulder Flexion 5/5    Left Shoulder ABduction 5/5    Left Shoulder Internal Rotation 4+/5    Left Shoulder External Rotation 4+/5    Right Hand Grip (lbs) 39.3                           [facility] Adult PT Treatment/Exercise - 09/05/20 0001       Shoulder Exercises: ROM/Strengthening   UBE (Upper Arm Bike) L2.5 x 3 min each way      Modalities   Modalities Moist Heat      Moist Heat Therapy   Number Minutes Moist Heat 15 Minutes    Moist Heat Location Shoulder;Cervical                      PT Short Term Goals - 08/16/20 2750       PT SHORT TERM GOAL #1   Title Patient to be independent with initial HEP.    Status Achieved               PT Long Term Goals - 09/05/20 1635       PT LONG TERM GOAL #1   Title  Patient to be independent with advanced HEP.    Time 6    Period Weeks    Status Partially Met   met for current   Target Date 10/03/20      PT LONG TERM GOAL #2   Title Patient to demonstrate B UE strength >/=4+/5 and grip strength atleast 40lbs.    Time 6    Period Weeks    Status On-going    Target Date 10/03/20      PT LONG TERM GOAL #3   Title Patient to demonstrate R shoulder AROM WFL and without pain limiting.    Time 6    Period Weeks    Status Partially Met    Target Date 10/03/20      PT LONG TERM GOAL #4   Title Patient to demonstrate postural correction at rest and with activity for improved postural awareness.    Time 6    Period Weeks    Status On-going    Target Date 10/03/20      PT LONG TERM GOAL #5   Title Patient to report tolerance for work duties without pain limiting.    Time 6    Period Weeks    Status Partially Met   is cautious about certain activites but good tolerance   Target Date 10/03/20                   Plan - 09/05/20 1826     Clinical Impression Statement Pt overall demonstrates improvements with R shoulder AROM and strength. He experiences the most pain with OH motions and reaching behind his back. Educated him on the importance of depressed and retracted posture to promote normal mechanics of the shoulder complex. He is able to complete most job activites but is careful of lifting too many objects due to having pain at ~90 deg into flex or ABD. Pt would continue to benefit from skilled therapy to address remaining deficits with R shoulder AROM, strength, and tolerance for work related activites to improve function.    Personal Factors and Comorbidities Age;Comorbidity 2;Past/Current Experience;Profession;Time since onset of injury/illness/exacerbation    Comorbidities HLD, back surgery    PT Frequency 2x / week  PT Duration 6 weeks    PT Treatment/Interventions ADLs/Self Care Home Management;Cryotherapy;Electrical  Stimulation;Iontophoresis 4mg /ml Dexamethasone;Moist Heat;Therapeutic exercise;Therapeutic activities;Functional mobility training;Ultrasound;Neuromuscular re-education;Patient/family education;Manual techniques;Vasopneumatic Device;Taping;Energy conservation;Dry needling;Passive range of motion    PT Next Visit Plan periscap/RTC strengthening; [place] joint mobs for pain and to increase ROM    Consulted and Agree with Plan of Care Patient             Patient will benefit from skilled therapeutic intervention in order to improve the following deficits and impairments:  Increased edema, Decreased activity tolerance, Decreased strength, Increased fascial restricitons, Impaired UE functional use, Pain, Increased muscle spasms, Improper body mechanics, Decreased range of motion, Impaired flexibility, Postural dysfunction  Visit Diagnosis: Acute pain of right shoulder  Stiffness of right shoulder, not elsewhere classified  Pain in left elbow  Abnormal posture  Muscle weakness (generalized)     Problem List Patient Active Problem List   Diagnosis Date Noted   Impingement syndrome, shoulder, right 07/17/2020   Left tennis elbow 07/17/2020   Numbness and tingling in both hands 07/17/2020   Atypical chest pain 10/18/2019   Hemoptysis 10/18/2012   Preventative health care 04/07/2011   NUMBNESS, HAND 12/27/2007   HYPERLIPIDEMIA 12/24/2006   ALLERGIC RHINITIS 12/24/2006   GERD 12/24/2006   TB SKIN TEST, POSITIVE 12/24/2006    Artist Pais, PTA 09/05/2020, 6:36 PM  [facility] [address]  [place], [place], [address] Phone: [phone]   Fax:  [phone]  Name: Lucious Zou MRN: [id number] Date of Birth: 1960-07-10

## 2020-09-06 NOTE — Addendum Note (Signed)
Addended by: Rennie Natter on: 09/06/2020 06:18 PM   Modules accepted: Orders

## 2020-09-10 ENCOUNTER — Other Ambulatory Visit: Payer: Self-pay

## 2020-09-10 ENCOUNTER — Ambulatory Visit: Payer: Medicaid Other

## 2020-09-10 DIAGNOSIS — M25511 Pain in right shoulder: Secondary | ICD-10-CM

## 2020-09-10 DIAGNOSIS — R293 Abnormal posture: Secondary | ICD-10-CM

## 2020-09-10 DIAGNOSIS — M25611 Stiffness of right shoulder, not elsewhere classified: Secondary | ICD-10-CM

## 2020-09-10 DIAGNOSIS — M25522 Pain in left elbow: Secondary | ICD-10-CM

## 2020-09-10 DIAGNOSIS — M6281 Muscle weakness (generalized): Secondary | ICD-10-CM

## 2020-09-10 NOTE — Therapy (Signed)
[facility] [address]  [place], [place], [address] Phone: [phone]   Fax:  [phone]  Physical Therapy Treatment  Patient Details  Name: Cristian Fuller MRN: [id number] Date of Birth: 1961-01-16 Referring Provider (PT): Aundria Mems, MD   Encounter Date: 09/10/2020   PT End of Session - 09/10/20 0929     Visit Number 11    Number of Visits 18    Date for PT Re-Evaluation 09/06/20    Authorization Type [employer]    Authorization - Visit Number 11    Authorization - Number of Visits 12    PT Start Time 5638   pt late   PT Stop Time 0943   15 min of moist heat   PT Time Calculation (min) 54 min    Activity Tolerance Patient tolerated treatment well;Patient limited by pain    Behavior During Therapy [facility] for tasks assessed/performed             Past Medical History:  Diagnosis Date   Allergic rhinitis    Hyperlipidemia    Left varicocele    history of left varicocele    Past Surgical History:  Procedure Laterality Date   BACK SURGERY     ESOPHAGOGASTRODUODENOSCOPY  2002/2007   LEFT HEART CATH AND CORONARY ANGIOGRAPHY N/A 10/24/2019   Procedure: LEFT HEART CATH AND CORONARY ANGIOGRAPHY;  Surgeon: Lorretta Harp, MD;  Location: [facility];  Service: Cardiovascular;  Laterality: N/A;    There were no vitals filed for this visit.   Subjective Assessment - 09/10/20 0852     Subjective Not much change since last session.    Pertinent History HLD, back surgery    Diagnostic tests 07/17/20 cervical xray: Prominent lower cervical spondylosis at C5-6 and C6-7, with symmetrical neural foraminal encroachment; 07/17/20 R shoulder xray: Unremarkable right shoulder    Patient Stated Goals decrease pain    Currently in Pain? Yes    Pain Score 5     Pain Location Shoulder    Pain Orientation Right    Pain Descriptors / Indicators Aching    Pain Type Chronic pain                                [facility] Adult PT Treatment/Exercise - 09/10/20 0001       Shoulder Exercises: Seated   Other Seated Exercises thoracic extension with foam roll 10x2"      Shoulder Exercises: Standing   External Rotation Strengthening;Right;5 reps;Theraband    Theraband Level (Shoulder External Rotation) Level 2 (Red)    External Rotation Limitations 5x5"    Flexion Strengthening;Right;5 reps;Theraband    Theraband Level (Shoulder Flexion) Level 2 (Red)    Flexion Limitations 5x5"    Other Standing Exercises thoracic rotation with red TB 10x      Shoulder Exercises: ROM/Strengthening   UBE (Upper Arm Bike) L3.0 x 3 min each way    Other ROM/Strengthening Exercises R open book in supine 10x3"      Modalities   Modalities Moist Heat      Moist Heat Therapy   Number Minutes Moist Heat 15 Minutes    Moist Heat Location Shoulder;Cervical      Manual Therapy   Manual Therapy Soft tissue mobilization;Myofascial release    Soft tissue mobilization STM to R UT, anterior deltoid    Myofascial Release manual TPR to R UT,  anterior delotid                      PT Short Term Goals - 08/16/20 8466       PT SHORT TERM GOAL #1   Title Patient to be independent with initial HEP.    Status Achieved               PT Long Term Goals - 09/05/20 1635       PT LONG TERM GOAL #1   Title Patient to be independent with advanced HEP.    Time 6    Period Weeks    Status Partially Met   met for current   Target Date 10/03/20      PT LONG TERM GOAL #2   Title Patient to demonstrate B UE strength >/=4+/5 and grip strength atleast 40lbs.    Time 6    Period Weeks    Status Partially Met   R shoulder Abduction 4/5 limited by pain, R grip strength 39lbs.   Target Date 10/03/20      PT LONG TERM GOAL #3   Title Patient to demonstrate R shoulder AROM WFL and without pain limiting.    Time 6    Period Weeks    Status Partially Met   pain with overhead  movements still   Target Date 10/03/20      PT LONG TERM GOAL #4   Title Patient to demonstrate postural correction at rest and with activity for improved postural awareness.    Time 6    Period Weeks    Status On-going    Target Date 10/03/20      PT LONG TERM GOAL #5   Title Patient to report tolerance for work duties without pain limiting.    Time 6    Period Weeks    Status Partially Met   is cautious about certain activites but good tolerance   Target Date 10/03/20                   Plan - 09/10/20 0932     Clinical Impression Statement Pt responded well to treatment. Introduced thoracic mobility exercises to improve mechnics of the shoulder complex and posture. He showed decreased ROM with R open book and thoracic extension exercises, he may benefit from more of these exercises. Also followed up with isometric shoulder exercises to improve co-contraction of the shoulder muscles. Pt demonstrated a good performance of the exercises. Ended session with moist heat to reduce pain and increase tissue extensibility.    Personal Factors and Comorbidities Age;Comorbidity 2;Past/Current Experience;Profession;Time since onset of injury/illness/exacerbation    Comorbidities HLD, back surgery    PT Frequency 2x / week    PT Duration 6 weeks    PT Treatment/Interventions ADLs/Self Care Home Management;Cryotherapy;Electrical Stimulation;Iontophoresis 23m/ml Dexamethasone;Moist Heat;Therapeutic exercise;Therapeutic activities;Functional mobility training;Ultrasound;Neuromuscular re-education;Patient/family education;Manual techniques;Vasopneumatic Device;Taping;Energy conservation;Dry needling;Passive range of motion    PT Next Visit Plan periscap/RTC strengthening; thoracic mobility; [place] mobs for pain and to increase ROM    Consulted and Agree with Plan of Care Patient             Patient will benefit from skilled therapeutic intervention in order to improve the following  deficits and impairments:  Increased edema, Decreased activity tolerance, Decreased strength, Increased fascial restricitons, Impaired UE functional use, Pain, Increased muscle spasms, Improper body mechanics, Decreased range of motion, Impaired flexibility, Postural dysfunction  Visit Diagnosis: Acute pain of right shoulder  Stiffness of right shoulder, not elsewhere classified  Pain in left elbow  Abnormal posture  Muscle weakness (generalized)     Problem List Patient Active Problem List   Diagnosis Date Noted   Impingement syndrome, shoulder, right 07/17/2020   Left tennis elbow 07/17/2020   Numbness and tingling in both hands 07/17/2020   Atypical chest pain 10/18/2019   Hemoptysis 10/18/2012   Preventative health care 04/07/2011   NUMBNESS, HAND 12/27/2007   HYPERLIPIDEMIA 12/24/2006   ALLERGIC RHINITIS 12/24/2006   GERD 12/24/2006   TB SKIN TEST, POSITIVE 12/24/2006    Artist Pais, PTA 09/10/2020, 9:56 AM  [facility] [address]  [employer] [place], [place], [address] Phone: [phone]   Fax:  [phone]  Name: Cristian Fuller MRN: [id number] Date of Birth: [birth year]

## 2020-09-17 ENCOUNTER — Other Ambulatory Visit: Payer: Self-pay

## 2020-09-17 ENCOUNTER — Encounter: Payer: Self-pay | Admitting: Physical Therapy

## 2020-09-17 ENCOUNTER — Ambulatory Visit: Payer: Medicaid Other | Admitting: Physical Therapy

## 2020-09-17 DIAGNOSIS — M25511 Pain in right shoulder: Secondary | ICD-10-CM

## 2020-09-17 DIAGNOSIS — R293 Abnormal posture: Secondary | ICD-10-CM

## 2020-09-17 DIAGNOSIS — M25522 Pain in left elbow: Secondary | ICD-10-CM

## 2020-09-17 DIAGNOSIS — M25611 Stiffness of right shoulder, not elsewhere classified: Secondary | ICD-10-CM

## 2020-09-17 DIAGNOSIS — M6281 Muscle weakness (generalized): Secondary | ICD-10-CM

## 2020-09-17 NOTE — Therapy (Signed)
[facility] [address]  [place], [place], [address] Phone: [phone]   Fax:  [phone]  Physical Therapy Treatment  Patient Details  Name: Cristian Fuller MRN: [id number] Date of Birth: [birth year] Referring Provider (PT): Aundria Mems, MD   Encounter Date: 09/17/2020   PT End of Session - 09/17/20 0906     Visit Number 12    Number of Visits 20    Date for PT Re-Evaluation 10/03/20    Authorization Type [employer]    Authorization - Visit Number 11    Authorization - Number of Visits 12    PT Start Time 0903    PT Stop Time 0958    PT Time Calculation (min) 55 min    Activity Tolerance Patient tolerated treatment well;Patient limited by pain    Behavior During Therapy [facility] for tasks assessed/performed             Past Medical History:  Diagnosis Date   Allergic rhinitis    Hyperlipidemia    Left varicocele    history of left varicocele    Past Surgical History:  Procedure Laterality Date   BACK SURGERY     ESOPHAGOGASTRODUODENOSCOPY  2002/2007   LEFT HEART CATH AND CORONARY ANGIOGRAPHY N/A 10/24/2019   Procedure: LEFT HEART CATH AND CORONARY ANGIOGRAPHY;  Surgeon: Lorretta Harp, MD;  Location: [place] [facility];  Service: Cardiovascular;  Laterality: N/A;    There were no vitals filed for this visit.   Subjective Assessment - 09/17/20 0905     Subjective Not much change since last session.    Pertinent History HLD, back surgery    Diagnostic tests 07/17/20 cervical xray: Prominent lower cervical spondylosis at C5-6 and C6-7, with symmetrical neural foraminal encroachment; 07/17/20 R shoulder xray: Unremarkable right shoulder    Patient Stated Goals decrease pain    Currently in Pain? Yes    Pain Score 6     Pain Location Shoulder    Pain Orientation Right                               [facility] Adult PT Treatment/Exercise - 09/17/20 0001        Exercises   Exercises Shoulder;Elbow;Wrist      Shoulder Exercises: Supine   Other Supine Exercises CW/CCW circles with 2# weight 3 x 10x each way    Other Supine Exercises Scap punches 2 x 10 2#      Shoulder Exercises: Seated   Other Seated Exercises thoracic extension with foam roll 10x2"      Shoulder Exercises: Sidelying   Other Sidelying Exercises open book 2 x 10      Shoulder Exercises: ROM/Strengthening   UBE (Upper Arm Bike) L3.0 x 3 min each way      Modalities   Modalities Moist Heat      Moist Heat Therapy   Number Minutes Moist Heat 15 Minutes    Moist Heat Location Shoulder;Cervical      Manual Therapy   Manual Therapy Soft tissue mobilization;Other (comment)   Dry Needling   Soft tissue mobilization TPR to R UT, R pectoralis, STM anterior deltoid    Other Manual Therapy see flowsheet              Trigger Point Dry Needling - 09/17/20 0001     Consent Given? Yes    Education Handout Provided Yes  Muscles Treated Head and Neck Upper trapezius;Levator scapulae    Muscles Treated Upper Quadrant Pectoralis minor;Infraspinatus    Upper Trapezius Response Twitch reponse elicited;Palpable increased muscle length    Levator Scapulae Response Twitch response elicited;Palpable increased muscle length    Pectoralis Minor Response Twitch response elicited;Palpable increased muscle length    Infraspinatus Response Twitch response elicited;Palpable increased muscle length                  PT Education - 09/17/20 0954     Education Details educated on risks of dry needling, post dry needling instructions, what to do if experience any shortness of breath, dry cough, or chest pain (go to ER and advise dry needled over lung fields).    Person(s) Educated Patient    Methods Explanation;Demonstration;Handout    Comprehension Verbalized understanding              PT Short Term Goals - 08/16/20 0928       PT SHORT TERM GOAL #1   Title Patient to be  independent with initial HEP.    Status Achieved               PT Long Term Goals - 09/05/20 1635       PT LONG TERM GOAL #1   Title Patient to be independent with advanced HEP.    Time 6    Period Weeks    Status Partially Met   met for current   Target Date 10/03/20      PT LONG TERM GOAL #2   Title Patient to demonstrate B UE strength >/=4+/5 and grip strength atleast 40lbs.    Time 6    Period Weeks    Status Partially Met   R shoulder Abduction 4/5 limited by pain, R grip strength 39lbs.   Target Date 10/03/20      PT LONG TERM GOAL #3   Title Patient to demonstrate R shoulder AROM WFL and without pain limiting.    Time 6    Period Weeks    Status Partially Met   pain with overhead movements still   Target Date 10/03/20      PT LONG TERM GOAL #4   Title Patient to demonstrate postural correction at rest and with activity for improved postural awareness.    Time 6    Period Weeks    Status On-going    Target Date 10/03/20      PT LONG TERM GOAL #5   Title Patient to report tolerance for work duties without pain limiting.    Time 6    Period Weeks    Status Partially Met   is cautious about certain activites but good tolerance   Target Date 10/03/20                   Plan - 09/17/20 0906     Clinical Impression Statement Pt. arrived 15 min late but due to cancellation was able to be seen.  Pt. reports using shoulder a lot this weekend resulting in increased pain.  Continues to report pain mostly in pectoralis and levator scapulae, had palpable trigger points in both so discussed dry needling, patient was familier with acupuncture, educated on differences and risk factors, patient consented to trying dry needling today.  Reported immediate improvement in ROM and ability to stretch R arm across after interventions.  Pt. would benefit from continued skilled therapy.    Personal Factors and Comorbidities Age;Comorbidity 2;Past/Current  Experience;Profession;Time  since onset of injury/illness/exacerbation    Comorbidities HLD, back surgery    PT Frequency 2x / week    PT Duration 6 weeks    PT Treatment/Interventions ADLs/Self Care Home Management;Cryotherapy;Electrical Stimulation;Iontophoresis 4mg /ml Dexamethasone;Moist Heat;Therapeutic exercise;Therapeutic activities;Functional mobility training;Ultrasound;Neuromuscular re-education;Patient/family education;Manual techniques;Vasopneumatic Device;Taping;Energy conservation;Dry needling;Passive range of motion    PT Next Visit Plan periscap/RTC strengthening; thoracic mobility; [place] joint mobs for pain and to increase ROM    Consulted and Agree with Plan of Care Patient             Patient will benefit from skilled therapeutic intervention in order to improve the following deficits and impairments:  Increased edema, Decreased activity tolerance, Decreased strength, Increased fascial restricitons, Impaired UE functional use, Pain, Increased muscle spasms, Improper body mechanics, Decreased range of motion, Impaired flexibility, Postural dysfunction  Visit Diagnosis: Acute pain of right shoulder  Stiffness of right shoulder, not elsewhere classified  Pain in left elbow  Abnormal posture  Muscle weakness (generalized)     Problem List Patient Active Problem List   Diagnosis Date Noted   Impingement syndrome, shoulder, right 07/17/2020   Left tennis elbow 07/17/2020   Numbness and tingling in both hands 07/17/2020   Atypical chest pain 10/18/2019   Hemoptysis 10/18/2012   Preventative health care 04/07/2011   NUMBNESS, HAND 12/27/2007   HYPERLIPIDEMIA 12/24/2006   ALLERGIC RHINITIS 12/24/2006   GERD 12/24/2006   TB SKIN TEST, POSITIVE 12/24/2006    Rennie Natter PT, DPT 09/17/2020, 9:57 AM  [facility] [address]  [place], [place], [address] Phone: [phone]   Fax:   [phone]  Name: Cristian Fuller MRN: [id number] Date of Birth: Jun 27, 1960

## 2020-09-17 NOTE — Patient Instructions (Signed)

## 2020-09-19 ENCOUNTER — Other Ambulatory Visit: Payer: Self-pay | Admitting: Sports Medicine

## 2020-09-19 DIAGNOSIS — M7541 Impingement syndrome of right shoulder: Secondary | ICD-10-CM

## 2020-09-20 ENCOUNTER — Ambulatory Visit: Payer: Medicaid Other | Admitting: Physical Therapy

## 2020-09-20 ENCOUNTER — Other Ambulatory Visit: Payer: Self-pay

## 2020-09-20 ENCOUNTER — Encounter: Payer: Self-pay | Admitting: Physical Therapy

## 2020-09-20 DIAGNOSIS — R293 Abnormal posture: Secondary | ICD-10-CM

## 2020-09-20 DIAGNOSIS — M25511 Pain in right shoulder: Secondary | ICD-10-CM

## 2020-09-20 DIAGNOSIS — M25611 Stiffness of right shoulder, not elsewhere classified: Secondary | ICD-10-CM

## 2020-09-20 DIAGNOSIS — M25522 Pain in left elbow: Secondary | ICD-10-CM

## 2020-09-20 DIAGNOSIS — M6281 Muscle weakness (generalized): Secondary | ICD-10-CM

## 2020-09-20 NOTE — Therapy (Signed)
[facility] [address]  [place], [place], [address] Phone: [phone]   Fax:  [phone]  Physical Therapy Treatment  Patient Details  Name: Cristian Fuller MRN: [id number] Date of Birth: [birth year] Referring Provider (PT): Aundria Mems, MD   Encounter Date: 09/20/2020   PT End of Session - 09/20/20 0808     Visit Number 13    Number of Visits 20    Date for PT Re-Evaluation 10/03/20    Authorization Type [employer]    Authorization - Visit Number 60    Authorization - Number of Visits 12    PT Start Time 0803    PT Stop Time 6283    PT Time Calculation (min) 55 min    Activity Tolerance Patient tolerated treatment well    Behavior During Therapy [facility] for tasks assessed/performed             Past Medical History:  Diagnosis Date   Allergic rhinitis    Hyperlipidemia    Left varicocele    history of left varicocele    Past Surgical History:  Procedure Laterality Date   BACK SURGERY     ESOPHAGOGASTRODUODENOSCOPY  2002/2007   LEFT HEART CATH AND CORONARY ANGIOGRAPHY N/A 10/24/2019   Procedure: LEFT HEART CATH AND CORONARY ANGIOGRAPHY;  Surgeon: Lorretta Harp, MD;  Location: [place] [facility];  Service: Cardiovascular;  Laterality: N/A;    There were no vitals filed for this visit.   Subjective Assessment - 09/20/20 0807     Subjective Pt. reports soreness after dry needling, had to take a painkiller yesterday.  Better today.    Pertinent History HLD, back surgery    Limitations Lifting;House hold activities    Diagnostic tests 07/17/20 cervical xray: Prominent lower cervical spondylosis at C5-6 and C6-7, with symmetrical neural foraminal encroachment; 07/17/20 R shoulder xray: Unremarkable right shoulder    Patient Stated Goals decrease pain    Currently in Pain? Yes    Pain Score 4     Pain Location Shoulder    Pain Orientation Right                                [facility] Adult PT Treatment/Exercise - 09/20/20 0001       Exercises   Exercises Shoulder;Elbow;Wrist      Shoulder Exercises: Supine   Other Supine Exercises --    Other Supine Exercises --      Shoulder Exercises: ROM/Strengthening   UBE (Upper Arm Bike) L3.0 x 3 min each way    Wall Pushups 20 reps    Wall Pushups Limitations cues to place feet closer to wall to decrease discomfort    "W" Arms wall angels to 120 deg bil 2 x 10    Proximal Shoulder Strengthening, Seated I,Y,T's to 90 deg 2 x8 no weight    Ball on Wall 2# CW/CCW circles 3 x 10    Rhythmic Stabilization, Supine x 1 min      Modalities   Modalities Moist Heat      Moist Heat Therapy   Number Minutes Moist Heat 10 Minutes    Moist Heat Location Shoulder;Cervical      Manual Therapy   Manual Therapy Joint mobilization;Soft tissue mobilization    Joint Mobilization mobs to clavicle, scapula, distraction GH joint    Soft tissue mobilization TPR to R subscap, rhomboids, pectoralis, IASTM with  s/s tool to R pectoralis    Passive ROM R shoulder all motions                      PT Short Term Goals - 08/16/20 1583       PT SHORT TERM GOAL #1   Title Patient to be independent with initial HEP.    Status Achieved               PT Long Term Goals - 09/05/20 1635       PT LONG TERM GOAL #1   Title Patient to be independent with advanced HEP.    Time 6    Period Weeks    Status Partially Met   met for current   Target Date 10/03/20      PT LONG TERM GOAL #2   Title Patient to demonstrate B UE strength >/=4+/5 and grip strength atleast 40lbs.    Time 6    Period Weeks    Status Partially Met   R shoulder Abduction 4/5 limited by pain, R grip strength 39lbs.   Target Date 10/03/20      PT LONG TERM GOAL #3   Title Patient to demonstrate R shoulder AROM WFL and without pain limiting.    Time 6    Period Weeks    Status Partially Met   pain with  overhead movements still   Target Date 10/03/20      PT LONG TERM GOAL #4   Title Patient to demonstrate postural correction at rest and with activity for improved postural awareness.    Time 6    Period Weeks    Status On-going    Target Date 10/03/20      PT LONG TERM GOAL #5   Title Patient to report tolerance for work duties without pain limiting.    Time 6    Period Weeks    Status Partially Met   is cautious about certain activites but good tolerance   Target Date 10/03/20                   Plan - 09/20/20 0849     Clinical Impression Statement Patient reports more soreness after dry needling, took pain killer yesterday morning, however today pain is less,  primarily just in pectoralis muscle with cross body movements.  His ROM was WNL today for ER/IR and flexion, tightness with abduction.  Reported mostly fatigue with exercises today more than pain.  MHP at end of session.  Pt. would benefit from continues skilled therapy.    Personal Factors and Comorbidities Age;Comorbidity 2;Past/Current Experience;Profession;Time since onset of injury/illness/exacerbation    Comorbidities HLD, back surgery    PT Frequency 2x / week    PT Duration 6 weeks    PT Treatment/Interventions ADLs/Self Care Home Management;Cryotherapy;Electrical Stimulation;Iontophoresis 4mg /ml Dexamethasone;Moist Heat;Therapeutic exercise;Therapeutic activities;Functional mobility training;Ultrasound;Neuromuscular re-education;Patient/family education;Manual techniques;Vasopneumatic Device;Taping;Energy conservation;Dry needling;Passive range of motion    PT Next Visit Plan periscap/RTC strengthening; thoracic mobility; [place] joint mobs for pain and to increase ROM    Consulted and Agree with Plan of Care Patient             Patient will benefit from skilled therapeutic intervention in order to improve the following deficits and impairments:  Increased edema, Decreased activity tolerance, Decreased  strength, Increased fascial restricitons, Impaired UE functional use, Pain, Increased muscle spasms, Improper body mechanics, Decreased range of motion, Impaired flexibility, Postural dysfunction  Visit Diagnosis: Acute pain  of right shoulder  Stiffness of right shoulder, not elsewhere classified  Pain in left elbow  Abnormal posture  Muscle weakness (generalized)     Problem List Patient Active Problem List   Diagnosis Date Noted   Impingement syndrome, shoulder, right 07/17/2020   Left tennis elbow 07/17/2020   Numbness and tingling in both hands 07/17/2020   Atypical chest pain 10/18/2019   Hemoptysis 10/18/2012   Preventative health care 04/07/2011   NUMBNESS, HAND 12/27/2007   HYPERLIPIDEMIA 12/24/2006   ALLERGIC RHINITIS 12/24/2006   GERD 12/24/2006   TB SKIN TEST, POSITIVE 12/24/2006    Rennie Natter PT, DPT 09/20/2020, 8:52 AM  [facility] [address]  [place], [place], [address] Phone: [phone]   Fax:  [phone]  Name: Cristian Fuller MRN: [id number] Date of Birth: 02-09-1960

## 2020-09-24 ENCOUNTER — Ambulatory Visit: Payer: Medicaid Other

## 2020-09-24 ENCOUNTER — Other Ambulatory Visit: Payer: Self-pay

## 2020-09-24 DIAGNOSIS — R293 Abnormal posture: Secondary | ICD-10-CM

## 2020-09-24 DIAGNOSIS — M6281 Muscle weakness (generalized): Secondary | ICD-10-CM

## 2020-09-24 DIAGNOSIS — M25522 Pain in left elbow: Secondary | ICD-10-CM

## 2020-09-24 DIAGNOSIS — M25611 Stiffness of right shoulder, not elsewhere classified: Secondary | ICD-10-CM

## 2020-09-24 DIAGNOSIS — M25511 Pain in right shoulder: Secondary | ICD-10-CM

## 2020-09-24 NOTE — Therapy (Signed)
[facility] [address]  [place], [place], [address] Phone: [phone]   Fax:  [phone]  Physical Therapy Treatment  Patient Details  Name: Cristian Fuller MRN: [id number] Date of Birth: 12/25/1960 Referring Provider (PT): Aundria Mems, MD   Encounter Date: 09/24/2020   PT End of Session - 09/24/20 0846     Visit Number 14    Number of Visits 20    Date for PT Re-Evaluation 10/03/20    Authorization Type [employer]    Authorization - Visit Number 5    Authorization - Number of Visits 8    PT Start Time 0803    PT Stop Time 9407    PT Time Calculation (min) 54 min    Activity Tolerance Patient tolerated treatment well    Behavior During Therapy [facility] for tasks assessed/performed             Past Medical History:  Diagnosis Date   Allergic rhinitis    Hyperlipidemia    Left varicocele    history of left varicocele    Past Surgical History:  Procedure Laterality Date   BACK SURGERY     ESOPHAGOGASTRODUODENOSCOPY  2002/2007   LEFT HEART CATH AND CORONARY ANGIOGRAPHY N/A 10/24/2019   Procedure: LEFT HEART CATH AND CORONARY ANGIOGRAPHY;  Surgeon: Lorretta Harp, MD;  Location: [facility];  Service: Cardiovascular;  Laterality: N/A;    There were no vitals filed for this visit.   Subjective Assessment - 09/24/20 0803     Subjective Worked all weekend (7am-11pm), shoulder feels about the same.    Pertinent History HLD, back surgery    Diagnostic tests 07/17/20 cervical xray: Prominent lower cervical spondylosis at C5-6 and C6-7, with symmetrical neural foraminal encroachment; 07/17/20 R shoulder xray: Unremarkable right shoulder    Patient Stated Goals decrease pain    Currently in Pain? Yes    Pain Score 4     Pain Location Shoulder    Pain Orientation Right    Pain Descriptors / Indicators Aching    Pain Type Chronic pain                [facility] PT Assessment -  09/24/20 0001       AROM   Right Shoulder Flexion 157 Degrees    Right Shoulder ABduction 154 Degrees                           [facility] Adult PT Treatment/Exercise - 09/24/20 0001       Shoulder Exercises: Standing   ABduction Strengthening;Right;10 reps;Theraband    Theraband Level (Shoulder ABduction) Level 1 (Yellow)    ABduction Limitations into scaption      Shoulder Exercises: ROM/Strengthening   UBE (Upper Arm Bike) L3.0 x 3 min each way    Lat Pull Limitations 20# 2x10    Cybex Row Limitations 20# 2x10      Modalities   Modalities Electrical Stimulation      Electrical Stimulation   Electrical Stimulation Location R shoulder    Electrical Stimulation Action IFC    Electrical Stimulation Parameters 80-150 Hz, intensity to pt tolerance    Electrical Stimulation Goals Pain      Manual Therapy   Manual Therapy Joint mobilization;Passive ROM    Joint Mobilization GH joint PA/AP/inf mobs grades II for pain    Passive ROM R shoulder all motions  PT Short Term Goals - 08/16/20 8828       PT SHORT TERM GOAL #1   Title Patient to be independent with initial HEP.    Status Achieved               PT Long Term Goals - 09/05/20 1635       PT LONG TERM GOAL #1   Title Patient to be independent with advanced HEP.    Time 6    Period Weeks    Status Partially Met   met for current   Target Date 10/03/20      PT LONG TERM GOAL #2   Title Patient to demonstrate B UE strength >/=4+/5 and grip strength atleast 40lbs.    Time 6    Period Weeks    Status Partially Met   R shoulder Abduction 4/5 limited by pain, R grip strength 39lbs.   Target Date 10/03/20      PT LONG TERM GOAL #3   Title Patient to demonstrate R shoulder AROM WFL and without pain limiting.    Time 6    Period Weeks    Status Partially Met   pain with overhead movements still   Target Date 10/03/20      PT LONG TERM GOAL #4   Title Patient to  demonstrate postural correction at rest and with activity for improved postural awareness.    Time 6    Period Weeks    Status On-going    Target Date 10/03/20      PT LONG TERM GOAL #5   Title Patient to report tolerance for work duties without pain limiting.    Time 6    Period Weeks    Status Partially Met   is cautious about certain activites but good tolerance   Target Date 10/03/20                   Plan - 09/24/20 0847     Clinical Impression Statement Pt reported continuous pain in his R shoulder with OH and crossbody movement. AROM is still around the same as last time we measured. [place] joint mobs and PROM to the R shoulder to decrease pain. Also tried estim post session to further decrease pain. Explained the goal of treatment and precuations of estim before application. Pt reported improvement in reaching ability and pain post estim treatment.    Personal Factors and Comorbidities Age;Comorbidity 2;Past/Current Experience;Profession;Time since onset of injury/illness/exacerbation    Comorbidities HLD, back surgery    PT Frequency 2x / week    PT Duration 6 weeks    PT Treatment/Interventions ADLs/Self Care Home Management;Cryotherapy;Electrical Stimulation;Iontophoresis 57m/ml Dexamethasone;Moist Heat;Therapeutic exercise;Therapeutic activities;Functional mobility training;Ultrasound;Neuromuscular re-education;Patient/family education;Manual techniques;Vasopneumatic Device;Taping;Energy conservation;Dry needling;Passive range of motion    PT Next Visit Plan periscap/RTC strengthening; thoracic mobility; [place] for pain and to increase ROM    Consulted and Agree with Plan of Care Patient             Patient will benefit from skilled therapeutic intervention in order to improve the following deficits and impairments:  Increased edema, Decreased activity tolerance, Decreased strength, Increased fascial restricitons, Impaired UE functional use, Pain, Increased  muscle spasms, Improper body mechanics, Decreased range of motion, Impaired flexibility, Postural dysfunction  Visit Diagnosis: Acute pain of right shoulder  Stiffness of right shoulder, not elsewhere classified  Pain in left elbow  Abnormal posture  Muscle weakness (generalized)     Problem List Patient Active Problem  List   Diagnosis Date Noted   Impingement syndrome, shoulder, right 07/17/2020   Left tennis elbow 07/17/2020   Numbness and tingling in both hands 07/17/2020   Atypical chest pain 10/18/2019   Hemoptysis 10/18/2012   Preventative health care 04/07/2011   NUMBNESS, HAND 12/27/2007   HYPERLIPIDEMIA 12/24/2006   ALLERGIC RHINITIS 12/24/2006   GERD 12/24/2006   TB SKIN TEST, POSITIVE 12/24/2006    Artist Pais, PTA 09/24/2020, 9:29 AM  [facility] [address]  [place], [place], [address] Phone: [phone]   Fax:  [phone]  Name: Amare Bail MRN: [id number] Date of Birth: August 27, 1960

## 2020-09-25 ENCOUNTER — Other Ambulatory Visit: Payer: Self-pay

## 2020-09-25 ENCOUNTER — Ambulatory Visit (INDEPENDENT_AMBULATORY_CARE_PROVIDER_SITE_OTHER): Payer: Medicaid Other | Admitting: Sports Medicine

## 2020-09-25 DIAGNOSIS — R202 Paresthesia of skin: Secondary | ICD-10-CM | POA: Diagnosis not present

## 2020-09-25 DIAGNOSIS — G8929 Other chronic pain: Secondary | ICD-10-CM | POA: Diagnosis not present

## 2020-09-25 DIAGNOSIS — R200 Anesthesia of skin: Secondary | ICD-10-CM

## 2020-09-25 DIAGNOSIS — M25511 Pain in right shoulder: Secondary | ICD-10-CM

## 2020-09-25 NOTE — Assessment & Plan Note (Signed)
This is a pleasant 60 year old male, he had right shoulder impingement and labral type symptoms, we did a subacromial injection back in June, he only had about 50% improvement, he did some formal physical therapy. Unfortunately after 2 months of physical therapy he still has discomfort in the shoulder, worse with bringing his arm across his chest, he does not have any pain at the acromioclavicular joint, but he does have a positive speeds test and positive O'Brien's test concerning for bicipital and labral injury. Considering the above and failure of conservative treatment we will proceed with MR arthrography and referral to Dr. Griffin Basil. Hopefully we can see him Monday for arthrogram injection.

## 2020-09-25 NOTE — Progress Notes (Signed)
    Procedures performed today:    None.  Independent interpretation of notes and tests performed by another provider:   None.  Brief History, Exam, Impression, and Recommendations:    Chronic right shoulder pain This is a pleasant 60 year old male, he had right shoulder impingement and labral type symptoms, we did a subacromial injection back in June, he only had about 50% improvement, he did some formal physical therapy. Unfortunately after 2 months of physical therapy he still has discomfort in the shoulder, worse with bringing his arm across his chest, he does not have any pain at the acromioclavicular joint, but he does have a positive speeds test and positive O'Brien's test concerning for bicipital and labral injury. Considering the above and failure of conservative treatment we will proceed with MR arthrography and referral to Dr. Griffin Basil. Hopefully we can see him Monday for arthrogram injection.  Numbness and tingling in both hands In addition he has had some numbness and tingling in both hands, predominantly the middle finger and the radial half of the fourth finger, minimally positive Tinel sign on the right, he did start Neurontin, this has helped him to sleep but he has only noted marginal improvement in his hand paresthesias. There is a bit of neck pain, we will pull the trigger now for nerve conduction and EMG, if it does reveal carpal tunnel syndrome I will do bilateral ultrasound-guided median nerve Hydro dissections.    ___________________________________________ Gwen Her. Dianah Field, M.D., ABFM., CAQSM. Primary Care and [place] [employer] of [place] [employer] [facility]

## 2020-09-25 NOTE — Assessment & Plan Note (Signed)
In addition he has had some numbness and tingling in both hands, predominantly the middle finger and the radial half of the fourth finger, minimally positive Tinel sign on the right, he did start Neurontin, this has helped him to sleep but he has only noted marginal improvement in his hand paresthesias. There is a bit of neck pain, we will pull the trigger now for nerve conduction and EMG, if it does reveal carpal tunnel syndrome I will do bilateral ultrasound-guided median nerve Hydro dissections.

## 2020-09-28 ENCOUNTER — Other Ambulatory Visit: Payer: Self-pay

## 2020-09-28 ENCOUNTER — Ambulatory Visit: Payer: Medicaid Other | Admitting: Physical Therapy

## 2020-09-28 DIAGNOSIS — M25511 Pain in right shoulder: Secondary | ICD-10-CM

## 2020-09-28 DIAGNOSIS — R293 Abnormal posture: Secondary | ICD-10-CM

## 2020-09-28 DIAGNOSIS — M25522 Pain in left elbow: Secondary | ICD-10-CM

## 2020-09-28 DIAGNOSIS — M6281 Muscle weakness (generalized): Secondary | ICD-10-CM

## 2020-09-28 DIAGNOSIS — M25611 Stiffness of right shoulder, not elsewhere classified: Secondary | ICD-10-CM

## 2020-09-28 NOTE — Therapy (Signed)
[name] [address]  [place], [place], [address] Phone: [phone]   Fax:  [phone]  Physical Therapy Discharge  PHYSICAL THERAPY DISCHARGE SUMMARY  Visits from Start of Care: 15  Current functional level related to goals / functional outcomes: independent   Remaining deficits: Overhead and cross body movements   Education / Equipment: Yes, HEP.   Plan: Patient agrees to discharge.  Patient goals were not met. Patient is being discharged due to lack of progress and suspected labral/RTC tear and possible need for more invasive interventions.       Patient Details  Name: Cristian Fuller MRN: [id number] Date of Birth: Jul 08, 1960 Referring Provider (PT): Aundria Mems, MD   Encounter Date: 09/28/2020   PT End of Session - 09/28/20 0807     Visit Number 15    Number of Visits 20    Date for PT Re-Evaluation 10/03/20    Authorization Type [place] [employer]    Authorization - Visit Number 5    Authorization - Number of Visits 8    PT Start Time 0803    PT Stop Time 6979    PT Time Calculation (min) 50 min    Activity Tolerance Patient tolerated treatment well    Behavior During Therapy [facility] for tasks assessed/performed             Past Medical History:  Diagnosis Date   Allergic rhinitis    Hyperlipidemia    Left varicocele    history of left varicocele    Past Surgical History:  Procedure Laterality Date   BACK SURGERY     ESOPHAGOGASTRODUODENOSCOPY  2002/2007   LEFT HEART CATH AND CORONARY ANGIOGRAPHY N/A 10/24/2019   Procedure: LEFT HEART CATH AND CORONARY ANGIOGRAPHY;  Surgeon: Lorretta Harp, MD;  Location: [facility];  Service: Cardiovascular;  Laterality: N/A;    There were no vitals filed for this visit.   Subjective Assessment - 09/28/20 0811     Subjective Patient saw Dr. Helane Rima this week about continued shoulder pain especially with cross body  mvoements, he suspects labral tear and recommends MRI.  Pt. is waiting for approval for MRI.  Reports more L elbow pain today due to tennis elbow.    Pertinent History HLD, back surgery    Limitations Lifting;House hold activities    Diagnostic tests 07/17/20 cervical xray: Prominent lower cervical spondylosis at C5-6 and C6-7, with symmetrical neural foraminal encroachment; 07/17/20 R shoulder xray: Unremarkable right shoulder    Patient Stated Goals decrease pain    Currently in Pain? Yes    Pain Score 3     Pain Location Shoulder    Pain Orientation Right    Pain Descriptors / Indicators Aching                [facility] PT Assessment - 09/28/20 0001       Assessment   Medical Diagnosis R shoulder impingement    Referring Provider (PT) Aundria Mems, MD    Onset Date/Surgical Date 01/26/20    Hand Dominance Right      Strength   Overall Strength Within functional limits for tasks performed    Right/Left Shoulder Right;Left    Right Shoulder Flexion 4+/5    Right Shoulder ABduction 4+/5    Right Shoulder Internal Rotation 4+/5    Right Shoulder External Rotation 4+/5    Left Shoulder Flexion 4+/5    Left Shoulder ABduction 4+/5  Left Shoulder Internal Rotation 4+/5    Left Shoulder External Rotation 4+/5    Right Elbow Flexion 4+/5    Left Elbow Flexion 4+/5    Right Hand Grip (lbs) 25    Left Hand Grip (lbs) 11                           [facility] Adult PT Treatment/Exercise - 09/28/20 0001       Exercises   Exercises Shoulder      Shoulder Exercises: ROM/Strengthening   UBE (Upper Arm Bike) L3.0 x 3 min each way      Moist Heat Therapy   Number Minutes Moist Heat 10 Minutes    Moist Heat Location Shoulder   Right     Manual Therapy   Manual Therapy Soft tissue mobilization;Myofascial release    Soft tissue mobilization TPR to R subscap, rhomboids, UT, IASTM with s/s tool to R pectoralis    Myofascial Release IASTM with s/s tools to L  forearm extensiors                      PT Short Term Goals - 08/16/20 0867       PT SHORT TERM GOAL #1   Title Patient to be independent with initial HEP.    Status Achieved               PT Long Term Goals - 09/28/20 0815       PT LONG TERM GOAL #1   Title Patient to be independent with advanced HEP.    Time 6    Period Weeks    Status Achieved   met for current     PT LONG TERM GOAL #2   Title Patient to demonstrate B UE strength >/=4+/5 and grip strength atleast 40lbs.    Time 6    Period Weeks    Status Partially Met   met for shoulder, not grip strength     PT LONG TERM GOAL #3   Title Patient to demonstrate R shoulder AROM WFL and without pain limiting.    Time 6    Period Weeks    Status Not Met   pain with overhead and cross body movements still     PT LONG TERM GOAL #4   Title Patient to demonstrate postural correction at rest and with activity for improved postural awareness.    Time 6    Period Weeks    Status Achieved      PT LONG TERM GOAL #5   Title Patient to report tolerance for work duties without pain limiting.    Time 6    Period Weeks    Status Not Met   is cautious about certain activites but good tolerance                  Plan - 09/28/20 1240     Clinical Impression Statement Patient has continued to report R shoulder pain with overhead and cross body movement, has returned to orthopedist who recommended MRI to rule out labral tear.    His strength has improved overall except grip strength, noted decreased grip strength in L hand with increased L elbow pain due to complant of L elbow bursitis, but after IASTM to extensors patient reported decreased pain.  Due to lack of improvement, discharging today so that he may receive therapy if surgical interventions are required.    Personal Factors  and Comorbidities Age;Comorbidity 2;Past/Current Experience;Profession;Time since onset of injury/illness/exacerbation     Comorbidities HLD, back surgery    PT Frequency 2x / week    PT Duration 6 weeks    PT Treatment/Interventions ADLs/Self Care Home Management;Cryotherapy;Electrical Stimulation;Iontophoresis 4mg /ml Dexamethasone;Moist Heat;Therapeutic exercise;Therapeutic activities;Functional mobility training;Ultrasound;Neuromuscular re-education;Patient/family education;Manual techniques;Vasopneumatic Device;Taping;Energy conservation;Dry needling;Passive range of motion    Consulted and Agree with Plan of Care Patient             Patient will benefit from skilled therapeutic intervention in order to improve the following deficits and impairments:  Increased edema, Decreased activity tolerance, Decreased strength, Increased fascial restricitons, Impaired UE functional use, Pain, Increased muscle spasms, Improper body mechanics, Decreased range of motion, Impaired flexibility, Postural dysfunction  Visit Diagnosis: Acute pain of right shoulder  Stiffness of right shoulder, not elsewhere classified  Pain in left elbow  Abnormal posture  Muscle weakness (generalized)     Problem List Patient Active Problem List   Diagnosis Date Noted   Chronic right shoulder pain 07/17/2020   Left tennis elbow 07/17/2020   Numbness and tingling in both hands 07/17/2020   Atypical chest pain 10/18/2019   Hemoptysis 10/18/2012   Preventative health care 04/07/2011   NUMBNESS, HAND 12/27/2007   HYPERLIPIDEMIA 12/24/2006   ALLERGIC RHINITIS 12/24/2006   GERD 12/24/2006   TB SKIN TEST, POSITIVE 12/24/2006    Rennie Natter PT, DPT 09/28/2020, 12:45 PM  [facility] [address]  [place], [place], [address] Phone: [phone]   Fax:  [phone]  Name: Maxxwell Edgett MRN: [id number] Date of Birth: 1960-05-30

## 2020-10-09 ENCOUNTER — Telehealth: Payer: Self-pay | Admitting: Family Medicine

## 2020-10-09 NOTE — Telephone Encounter (Signed)
Patient stopped by and was going downstairs to schedule his MRI (stated he hadn't heard anything yet so directed him downstairs to schedule). He left results from his neurologist he wanted you to look at. Patient stated that he would schedule an apt after he has his MRI. Result paper left in box. Am *10/09/20*

## 2020-10-22 ENCOUNTER — Ambulatory Visit (INDEPENDENT_AMBULATORY_CARE_PROVIDER_SITE_OTHER): Payer: Medicaid Other | Admitting: Sports Medicine

## 2020-10-22 ENCOUNTER — Ambulatory Visit (INDEPENDENT_AMBULATORY_CARE_PROVIDER_SITE_OTHER): Payer: Medicaid Other

## 2020-10-22 ENCOUNTER — Other Ambulatory Visit: Payer: Self-pay

## 2020-10-22 DIAGNOSIS — R202 Paresthesia of skin: Secondary | ICD-10-CM

## 2020-10-22 DIAGNOSIS — G8929 Other chronic pain: Secondary | ICD-10-CM

## 2020-10-22 DIAGNOSIS — R200 Anesthesia of skin: Secondary | ICD-10-CM | POA: Diagnosis not present

## 2020-10-22 DIAGNOSIS — M25511 Pain in right shoulder: Secondary | ICD-10-CM

## 2020-10-22 NOTE — Assessment & Plan Note (Signed)
Numbness and tingling in both hands, I do not have the results but he had a nerve conduction and EMG with [facility] that per his report showed bilateral carpal tunnel syndrome, at the 1 month follow-up we can do bilateral median nerve Hydro dissections.

## 2020-10-22 NOTE — Assessment & Plan Note (Signed)
He also has chronic right shoulder pain with impingement and labral type symptoms, he had 50% improvement with a subacromial injection back in June, he did some formal therapy, continue to have pain after 2 months of therapy, worse with bringing his arm across the chest, he did have a positive speeds and O'Brien's tests concerning for bicipital and labral injury, we proceeded with MR arthrography did a referral to Dr. Griffin Basil as well.

## 2020-10-22 NOTE — Progress Notes (Signed)
    Procedures performed today:    Procedure: Real-time Ultrasound Guided gadolinium contrast injection of right glenohumeral Device: Samsung HS60  Verbal informed consent obtained.  Time-out conducted.  Noted no overlying erythema, induration, or other signs of local infection.  Skin prepped in a sterile fashion.  Local anesthesia: Topical Ethyl chloride.  With sterile technique and under real time ultrasound guidance: I advanced a 22-gauge spinal needle into the right glenohumeral joint from a posterior approach, injected 1 cc kenalog 40, 2 cc lidocaine, 2 cc bupivacaine, syringe switched and 0.1 cc gadolinium injected, syringe and switched and 10 cc sterile saline used to distend the joint, joint was normal-appearing. Joint visualized and capsule seen distending confirming intra-articular placement of contrast material and medication. Completed without difficulty  Advised to call if fevers/chills, erythema, induration, drainage, or persistent bleeding.  Images permanently stored in PACS Impression: Technically successful ultrasound guided gadolinium contrast injection for MR arthrography.  Please see separate MR arthrogram report.  Independent interpretation of notes and tests performed by another provider:   None.  Brief History, Exam, Impression, and Recommendations:    Chronic right shoulder pain He also has chronic right shoulder pain with impingement and labral type symptoms, he had 50% improvement with a subacromial injection back in June, he did some formal therapy, continue to have pain after 2 months of therapy, worse with bringing his arm across the chest, he did have a positive speeds and O'Brien's tests concerning for bicipital and labral injury, we proceeded with MR arthrography did a referral to Dr. Griffin Basil as well.  Numbness and tingling in both hands Numbness and tingling in both hands, I do not have the results but he had a nerve conduction and EMG with [facility] that per his report showed bilateral carpal tunnel syndrome, at the 1 month follow-up we can do bilateral median nerve Hydro dissections.    ___________________________________________ Cristian Fuller. Cristian Fuller, M.D., ABFM., CAQSM. Primary Care and Oriental Instructor of [employer] [facility]

## 2020-11-17 ENCOUNTER — Emergency Department (HOSPITAL_COMMUNITY): Payer: Medicaid Other

## 2020-11-17 ENCOUNTER — Encounter (HOSPITAL_COMMUNITY): Payer: Self-pay | Admitting: Emergency Medicine

## 2020-11-17 ENCOUNTER — Emergency Department (HOSPITAL_COMMUNITY)
Admission: EM | Admit: 2020-11-17 | Discharge: 2020-11-17 | Disposition: A | Discharge status: Home / Self Care | Payer: Medicaid Other | Attending: Emergency Medicine | Admitting: Emergency Medicine

## 2020-11-17 DIAGNOSIS — Z7982 Long term (current) use of aspirin: Secondary | ICD-10-CM | POA: Diagnosis not present

## 2020-11-17 DIAGNOSIS — R079 Chest pain, unspecified: Secondary | ICD-10-CM | POA: Insufficient documentation

## 2020-11-17 DIAGNOSIS — R1013 Epigastric pain: Secondary | ICD-10-CM | POA: Insufficient documentation

## 2020-11-17 DIAGNOSIS — Z79899 Other long term (current) drug therapy: Secondary | ICD-10-CM | POA: Insufficient documentation

## 2020-11-17 LAB — CBC WITH DIFFERENTIAL/PLATELET
Abs Immature Granulocytes: 0.02 10*3/uL (ref 0.00–0.07)
Basophils Absolute: 0 10*3/uL (ref 0.0–0.1)
Basophils Relative: 1 %
Eosinophils Absolute: 0.1 10*3/uL (ref 0.0–0.5)
Eosinophils Relative: 1 %
HCT: 46.9 % (ref 39.0–52.0)
Hemoglobin: 15.4 g/dL (ref 13.0–17.0)
Immature Granulocytes: 0 %
Lymphocytes Relative: 41 %
Lymphs Abs: 2.3 10*3/uL (ref 0.7–4.0)
MCH: 30.8 pg (ref 26.0–34.0)
MCHC: 32.8 g/dL (ref 30.0–36.0)
MCV: 93.8 fL (ref 80.0–100.0)
Monocytes Absolute: 0.4 10*3/uL (ref 0.1–1.0)
Monocytes Relative: 7 %
Neutro Abs: 2.8 10*3/uL (ref 1.7–7.7)
Neutrophils Relative %: 50 %
Platelets: 247 10*3/uL (ref 150–400)
RBC: 5 MIL/uL (ref 4.22–5.81)
RDW: 12.8 % (ref 11.5–15.5)
WBC: 5.5 10*3/uL (ref 4.0–10.5)
nRBC: 0 % (ref 0.0–0.2)

## 2020-11-17 LAB — BASIC METABOLIC PANEL
Anion gap: 10 (ref 5–15)
BUN: 14 mg/dL (ref 6–20)
CO2: 25 mmol/L (ref 22–32)
Calcium: 9.2 mg/dL (ref 8.9–10.3)
Chloride: 102 mmol/L (ref 98–111)
Creatinine, Ser: 0.73 mg/dL (ref 0.61–1.24)
GFR, Estimated: >60 mL/min (ref >60)
Glucose, Bld: 140 mg/dL — ABNORMAL HIGH (ref 70–99)
Potassium: 3.9 mmol/L (ref 3.5–5.1)
Sodium: 137 mmol/L (ref 135–145)

## 2020-11-17 LAB — TROPONIN I (HIGH SENSITIVITY): Troponin I (High Sensitivity): 2 ng/L (ref <18)

## 2020-11-17 MED ORDER — NITROGLYCERIN 0.4 MG SL SUBL: 0.4000 mg | SUBLINGUAL_TABLET | Freq: Once | SUBLINGUAL | 0 refills | Status: AC | PRN

## 2020-11-17 MED ORDER — ASPIRIN 81 MG PO CHEW: 81.0000 mg | CHEWABLE_TABLET | Freq: Once | ORAL | 2 refills | Status: AC

## 2020-11-17 MED ORDER — NITROGLYCERIN 0.4 MG SL SUBL
0.4000 mg | SUBLINGUAL_TABLET | Freq: Once | SUBLINGUAL | Status: AC
  Administered 2020-11-17: 0.4 mg via SUBLINGUAL
  Filled 2020-11-17: qty 1

## 2020-11-17 MED ORDER — FAMOTIDINE 20 MG PO TABS: 20.0000 mg | ORAL_TABLET | Freq: Two times a day (BID) | ORAL | 2 refills | Status: AC

## 2020-11-17 NOTE — ED Provider Notes (Signed)
[facility] DEPT Provider Note   CSN: [id number] Arrival date & time: 11/17/20  8676     History Chief Complaint  Patient presents with   Chest Pain    Cristian Fuller is a 60 y.o. male with history reflux, hyperlipidemia, possible angina, presented ED with centralized chest pain and epigastric pain.  He reports onset a few minutes before arrival in the ED.  He describes a pressure sensation near his epigastrium and occasionally on the left lateral side of his pectoralis.  It seems to radiate up towards his throat.  He has had the same symptoms many times in the past.  He reports that the symptoms often begin "after up and moving very quickly" and are often associated also with eating.  He did eat this morning prior to this.  He has been seen by cardiology in the past for similar symptoms.  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  I believe initial strategy of antianginal therapy reserving intervention for recalcitrant symptoms.  I discussed this with Dr. Brigitte Pulse, the varying cardiologist, who agrees with this plan.  The sheath was removed and a TR band was placed on the right wrist to achieve patent hemostasis.  The patient left lab in stable condition.  He will be discharged home early this afternoon as an outpatient and will follow up with Dr. Manuella Ghazi who will initiate antianginal medications."  Of note the patient does not take any chronic medications aside from Crestor.  He does not appear to be an anginal medications  HPI     Past Medical History:  Diagnosis Date   Allergic rhinitis    Hyperlipidemia    Left varicocele    history of left varicocele     Patient Active Problem List   Diagnosis Date Noted   Chronic right shoulder pain 07/17/2020   Left tennis elbow 07/17/2020   Numbness and tingling in both hands 07/17/2020   Atypical chest pain 10/18/2019   Hemoptysis 10/18/2012   Preventative health care 04/07/2011   NUMBNESS, HAND 12/27/2007   HYPERLIPIDEMIA 12/24/2006   ALLERGIC RHINITIS 12/24/2006   GERD 12/24/2006   TB SKIN TEST, POSITIVE 12/24/2006    Past Surgical History:  Procedure Laterality Date   BACK SURGERY     ESOPHAGOGASTRODUODENOSCOPY  2002/2007   LEFT HEART CATH AND CORONARY ANGIOGRAPHY N/A 10/24/2019   Procedure: LEFT HEART CATH AND CORONARY ANGIOGRAPHY;  Surgeon: Lorretta Harp, MD;  Location: [facility];  Service: Cardiovascular;  Laterality: N/A;       Family History  Problem Relation Age of Onset   Pancreatic cancer Unknown    Cancer Unknown        gastric cancer   Stroke Unknown    Rectal cancer Unknown        aunt   Stomach cancer Unknown    Colon cancer Paternal Aunt    Stomach cancer Paternal Uncle     Social History   Tobacco Use   Smoking status: Never   Smokeless tobacco: Never  Substance Use Topics   Alcohol use: No   Drug use: No    Home Medications Prior  to Admission medications   Medication Sig Start Date End Date Taking? Authorizing Provider  aspirin 81 MG chewable tablet Chew 1 tablet (81 mg total) by mouth once for 1 dose. 11/17/20 11/17/20 Yes Hurman Ketelsen, Carola Rhine, MD  famotidine (PEPCID) 20 MG tablet Take 1 tablet (20 mg total) by mouth 2 (two) times daily. Takes 30 minutes before breakfast and dinner 11/17/20 12/17/20 Yes Jermaine Neuharth, Carola Rhine, MD  nitroGLYCERIN (NITROSTAT) 0.4 MG SL tablet Place 1 tablet (0.4 mg total) under the tongue once as needed for up to 1 dose for chest pain. 11/17/20  Yes Wyvonnia Dusky, MD  gabapentin (NEURONTIN) 300 MG capsule One tab PO qHS 08/28/20   Silverio Decamp, MD  meloxicam (MOBIC) 15 MG tablet TAKE 1 TABLET BY MOUTH  IN THE MORNING WITH A MEAL FOR 2 WEEKS THEN 1 ONCE DAILY AS NEEDED FOR PAIN 09/19/20   Silverio Decamp, MD  rosuvastatin (CRESTOR) 5 MG tablet Take 5 mg by mouth daily.    [provider]  Vitamin D, Ergocalciferol, (DRISDOL) 1.25 MG (50000 UNIT) CAPS capsule Take 50,000 Units by mouth once a week. 11/09/19   [provider]  simvastatin (ZOCOR) 40 MG tablet Take 1 tablet (40 mg total) by mouth at bedtime. 04/07/11 04/30/11  Shawna Orleans, Doe-Hyun R, DO    Allergies    Patient has no known allergies.  Review of Systems   Review of Systems  Constitutional:  Negative for chills and fever.  HENT:  Negative for ear pain and sore throat.   Eyes:  Negative for pain and visual disturbance.  Respiratory:  Negative for cough and shortness of breath.   Cardiovascular:  Positive for chest pain. Negative for palpitations.  Gastrointestinal:  Negative for abdominal pain, diarrhea, nausea and vomiting.  Genitourinary:  Negative for dysuria and hematuria.  Musculoskeletal:  Negative for arthralgias and back pain.  Skin:  Negative for color change and rash.  Neurological:  Negative for seizures and syncope.  All other systems reviewed and are negative.  Physical Exam Updated Vital Signs BP 102/66   Pulse (!) 54   Temp (!) 97.5 F (36.4 C) (Oral)   Resp 14   SpO2 94%   Physical Exam Constitutional:      General: He is not in acute distress. HENT:     Head: Normocephalic and atraumatic.  Eyes:     Conjunctiva/sclera: Conjunctivae normal.     Pupils: Pupils are equal, round, and reactive to light.  Cardiovascular:     Rate and Rhythm: Normal rate and regular rhythm.  Pulmonary:     Effort: Pulmonary effort is normal. No respiratory distress.  Abdominal:     General: There is no distension.     Tenderness: There is no abdominal tenderness.  Skin:    General: Skin is warm and dry.  Neurological:     General: No focal deficit present.     Mental Status: He is alert. Mental  status is at baseline.  Psychiatric:        Mood and Affect: Mood normal.        Behavior: Behavior normal.    ED Results / Procedures / Treatments   Labs (all labs ordered are listed, but only abnormal results are displayed) Labs Reviewed  BASIC METABOLIC PANEL - Abnormal; Notable for the following components:      Result Value   Glucose, Bld 140 (*)    All other components within normal limits  CBC WITH DIFFERENTIAL/PLATELET  TROPONIN I (  HIGH SENSITIVITY)  TROPONIN I (HIGH SENSITIVITY)    EKG None  Radiology DG Chest 2 View  Result Date: 11/17/2020 CLINICAL DATA:  Left-sided chest pain EXAM: CHEST - 2 VIEW COMPARISON:  September 2014 FINDINGS: Multiple external wires overlying right lower lung. No new consolidation or edema. Scarring mid left lung. The heart size and mediastinal contours are within normal limits. No pleural effusion or pneumothorax. The visualized skeletal structures are unremarkable. IMPRESSION: No acute process in the chest. Electronically Signed   By: Macy Mis M.D.   On: 11/17/2020 10:52    Procedures Procedures   Medications Ordered in ED Medications  nitroGLYCERIN (NITROSTAT) SL tablet 0.4 mg (0.4 mg Sublingual Given 11/17/20 1106)    ED Course  I have reviewed the triage vital signs and the nursing notes.  Pertinent labs & imaging results that were available during my care of the patient were reviewed by me and considered in my medical decision making (see chart for details).  This patient presents to the Emergency Department with complaint of chest pain. This involves an extensive number of treatment options, and is a complaint that carries with it a high risk of complications and morbidity.  The differential diagnosis includes ACS vs Pneumothorax vs Reflux/Gastritis vs MSK pain vs Pneumonia vs other.  I felt PE was less likely given that the symptoms have been intermittent, ongoing for several months or years, he has no hypoxia no  respiratory complaints.  He also has no acute risk factors for PE.  I ordered, reviewed, and interpreted labs, including BMP and CBC.  There were no immediate, life-threatening emergencies found in this labwork.  The patient's troponin level was 2. I ordered medication SL nitro for chest pain I ordered imaging studies which included x-ray of the chest I independently visualized and interpreted imaging which showed no focal infiltrates or acute intrathoracic process and the monitor tracing which showed sinus rhythm Previous records obtained and reviewed showing left heart cath from 1 year ago. I personally reviewed the patients ECG which showed sinus rhythm with no acute ischemic findings.  After the interventions stated above, I reevaluated the patient and found that they remained clinically stable.  Based on the patient's clinical exam, vital signs, risk factors, and ED testing, I felt that the patient's overall risk of life-threatening emergency such as ACS, PE, sepsis, or infection was low.  At this time, I felt the patient's presentation was most clinically consistent with reflux or stable angina.  I discussed outpatient follow up with primary care provider, and provided specialist office number on the patient's discharge paper if a referral was deemed necessary.  Return precautions were discussed with the patient.  I felt the patient was clinically stable for discharge.  Clinical Course as of 11/17/20 1432  Sat Nov 17, 2020  1118 Troponin I (High Sensitivity): 2 [MT]  7124 Patient reports that he has had resolution of his chest pain.  He is asymptomatic now.  It is possible that he was experiencing stable angina, which is suffered from in the past.  I therefore recommended we start him on a baby aspirin and provide sublingual nitro as needed, and I strongly encouraged him to follow-up with Dr Manuella Ghazi whom he states is his cardiologist (he has not seen him in about 6-7 months he states).  He also  questions whether he may have reflux, which I explained is a possibility, and can start him on Pepcid as well. [MT]    Clinical Course User Index [  MT] Wyvonnia Dusky, MD    Final Clinical Impression(s) / ED Diagnoses Final diagnoses:  Chest pain, unspecified type    Rx / DC Orders ED Discharge Orders          Ordered    aspirin 81 MG chewable tablet   Once        11/17/20 1131    nitroGLYCERIN (NITROSTAT) 0.4 MG SL tablet  Once PRN        11/17/20 1131    famotidine (PEPCID) 20 MG tablet  2 times daily        11/17/20 1131             Wyvonnia Dusky, MD 11/17/20 1432

## 2020-11-17 NOTE — ED Triage Notes (Signed)
PT reports central chest and epigastric pain x10 minutes.

## 2020-11-17 NOTE — Discharge Instructions (Addendum)
Please follow up with your cardiologist Dr Manuella Ghazi about your chest pain.

## 2020-11-20 ENCOUNTER — Ambulatory Visit: Payer: Medicaid Other | Admitting: Sports Medicine

## 2020-11-29 ENCOUNTER — Ambulatory Visit (INDEPENDENT_AMBULATORY_CARE_PROVIDER_SITE_OTHER): Payer: Medicaid Other | Admitting: Sports Medicine

## 2020-11-29 ENCOUNTER — Ambulatory Visit: Payer: Medicaid Other | Admitting: Student

## 2020-11-29 ENCOUNTER — Ambulatory Visit (INDEPENDENT_AMBULATORY_CARE_PROVIDER_SITE_OTHER): Payer: Medicaid Other

## 2020-11-29 DIAGNOSIS — R202 Paresthesia of skin: Secondary | ICD-10-CM

## 2020-11-29 DIAGNOSIS — R200 Anesthesia of skin: Secondary | ICD-10-CM

## 2020-11-29 DIAGNOSIS — G5603 Carpal tunnel syndrome, bilateral upper limbs: Secondary | ICD-10-CM | POA: Diagnosis not present

## 2020-11-29 NOTE — Progress Notes (Signed)
    Procedures performed today:    Procedure: Real-time Ultrasound Guided hydrodissection of the left median nerve at the carpal tunnel Device: Samsung HS60 Verbal informed consent obtained.  Time-out conducted.  Noted no overlying erythema, induration, or other signs of local infection.  Skin prepped in a sterile fashion.  Local anesthesia: Topical Ethyl chloride.  With sterile technique and under real time ultrasound guidance: Noted large nerve.  Using a 25-gauge needle advanced into the carpal tunnel, taking care to avoid intraneural injection I injected medication both superficial to and deep to the median nerve freeing it from surrounding structures, I then redirected the needle deep and injected further medication around the flexor tendons deep within the carpal tunnel for a total of 1 cc kenalog 40, 5 cc 1% lidocaine without epinephrine. Completed without difficulty  Advised to call if fevers/chills, erythema, induration, drainage, or persistent bleeding.  Images permanently stored and available for review in PACS.  Impression: Technically successful ultrasound guided median nerve hydrodissection.  Procedure: Real-time Ultrasound Guided hydrodissection of the right median nerve at the carpal tunnel Device: Samsung HS60 Verbal informed consent obtained.  Time-out conducted.  Noted no overlying erythema, induration, or other signs of local infection.  Skin prepped in a sterile fashion.  Local anesthesia: Topical Ethyl chloride.  With sterile technique and under real time ultrasound guidance: Noted large nerve.  Using a 25-gauge needle advanced into the carpal tunnel, taking care to avoid intraneural injection I injected medication both superficial to and deep to the median nerve freeing it from surrounding structures, I then redirected the needle deep and injected further medication around the flexor tendons deep within the carpal tunnel for a total of 1 cc kenalog 40, 5 cc 1% lidocaine  without epinephrine. Completed without difficulty  Advised to call if fevers/chills, erythema, induration, drainage, or persistent bleeding.  Images permanently stored and available for review in PACS.  Impression: Technically successful ultrasound guided median nerve hydrodissection.  Independent interpretation of notes and tests performed by another provider:   None.  Brief History, Exam, Impression, and Recommendations:    Numbness and tingling in both hands This is a pleasant 60 year old male sounds like he had a nerve conduction and EMG with [facility] that showed bilateral carpal tunnel syndrome, median neuropathy at the wrist. He has failed conservative treatment including night splinting and conditioning, today we have performed bilateral median nerve Hydro dissections with ultrasound guidance for Return to see me in 4 weeks.    ___________________________________________ Gwen Her. Dianah Field, M.D., ABFM., CAQSM. Primary Care and [place] Instructor of [place] [facility]

## 2020-11-29 NOTE — Assessment & Plan Note (Signed)
This is a pleasant 60 year old male sounds like he had a nerve conduction and EMG with [facility] that showed bilateral carpal tunnel syndrome, median neuropathy at the wrist. He has failed conservative treatment including night splinting and conditioning, today we have performed bilateral median nerve Hydro dissections with ultrasound guidance for Return to see me in 4 weeks.

## 2020-12-13 ENCOUNTER — Encounter (HOSPITAL_BASED_OUTPATIENT_CLINIC_OR_DEPARTMENT_OTHER): Payer: Self-pay | Admitting: Orthopaedic Surgery

## 2020-12-17 NOTE — H&P (Signed)
PREOPERATIVE H&P  Chief Complaint: right shoulder cartilage disorger, impingment syndrome, bicep tendinitis  HPI: Cristian Fuller is a 60 y.o. male who is scheduled for, Procedure(s): ARTHROSCOPY SHOULDER SHOULDER ARTHROSCOPY WITH SUBACROMIAL DECOMPRESSION AND BICEP TENDON REPAIR AND DISTAL CLAVICLE EXCISION.   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  He has tried injections, physical therapy and Meloxicam.  He has not made much progress.  He is still frustrated by his shoulder.    His symptoms are rated as moderate to severe, and have been worsening.  This is significantly impairing activities of daily living.    Please see clinic note for further details on this patient's care.    He has elected for surgical management.   Past Medical History:  Diagnosis Date   Allergic rhinitis    Hyperlipidemia    Left varicocele    history of left varicocele   Past Surgical History:  Procedure Laterality Date   BACK SURGERY     ESOPHAGOGASTRODUODENOSCOPY  2002/2007   LEFT HEART CATH AND CORONARY ANGIOGRAPHY N/A 10/24/2019   Procedure: LEFT HEART CATH AND CORONARY ANGIOGRAPHY;  Surgeon: Lorretta Harp, MD;  Location: [facility];  Service: Cardiovascular;  Laterality: N/A;   Social History   Socioeconomic History   Marital status: Married    Spouse name: Not on file   Number of children: Not on file   Years of education: Not on file   Highest education level: Not on file  Occupational History   Not on file  Tobacco Use   Smoking status: Never   Smokeless tobacco: Never  Substance and Sexual Activity   Alcohol use: No   Drug use: No   Sexual activity: Not on file  Other Topics Concern   Not on file  Social History Narrative   Occupation: Self employed   Married   Never Smoked    Alcohol use-no     Caffeine use/day:  2-3 times per week   Does Patient Exercise:  no   Social  Determinants of Radio broadcast assistant Strain: Not on file  Food Insecurity: Not on file  Transportation Needs: Not on file  Physical Activity: Not on file  Stress: Not on file  Social Connections: Not on file   Family History  Problem Relation Age of Onset   Pancreatic cancer Unknown    Cancer Unknown        gastric cancer   Stroke Unknown    Rectal cancer Unknown        aunt   Stomach cancer Unknown    Colon cancer Paternal Aunt    Stomach cancer Paternal Uncle    No Known Allergies Prior to Admission medications   Medication Sig Start Date End Date Taking? Authorizing Provider  famotidine (PEPCID) 20 MG tablet Take 1 tablet (20 mg total) by mouth 2 (two) times daily. Takes 30 minutes before breakfast and dinner 11/17/20 12/17/20 Yes Trifan, Carola Rhine, MD  rosuvastatin (CRESTOR) 5 MG tablet Take 5 mg by mouth daily.   Yes [provider]  Vitamin D, Ergocalciferol, (DRISDOL) 1.25 MG (50000 UNIT) CAPS capsule Take 50,000 Units by mouth once a week. 11/09/19  Yes [provider]  gabapentin (NEURONTIN) 300 MG capsule One tab PO qHS 08/28/20   Silverio Decamp, MD  meloxicam (MOBIC) 15 MG tablet TAKE 1 TABLET BY MOUTH IN THE MORNING WITH  A MEAL FOR 2 WEEKS THEN 1 ONCE DAILY AS NEEDED FOR PAIN 09/19/20   Silverio Decamp, MD  nitroGLYCERIN (NITROSTAT) 0.4 MG SL tablet Place 1 tablet (0.4 mg total) under the tongue once as needed for up to 1 dose for chest pain. 11/17/20   Wyvonnia Dusky, MD  simvastatin (ZOCOR) 40 MG tablet Take 1 tablet (40 mg total) by mouth at bedtime. 04/07/11 04/30/11  Shawna Orleans, Doe-Hyun R, DO    ROS: All other systems have been reviewed and were otherwise negative with the exception of those mentioned in the HPI and as above.  Physical Exam: General: Alert, no acute distress Cardiovascular: No pedal edema Respiratory: No cyanosis, no use of accessory musculature GI: No organomegaly, abdomen is soft and non-tender Skin: No lesions  in the area of chief complaint Neurologic: Sensation intact distally Psychiatric: Patient is competent for consent with normal mood and affect Lymphatic: No axillary or cervical lymphadenopathy  MUSCULOSKELETAL:  Range of motion of the right shoulder is 170 degrees.  Positive O'Brien's test, impingement.  Positive AC tenderness to palpation.  Cuff strength is reasonably intact.    Imaging: MR arthrogram demonstrates a severe superolabral tear with degenerative findings, AC arthrosis and type II acromion.   Assessment: right shoulder cartilage disorger, impingment syndrome, bicep tendinitis  Plan: Plan for Procedure(s): ARTHROSCOPY SHOULDER SHOULDER ARTHROSCOPY WITH SUBACROMIAL DECOMPRESSION AND BICEP TENDON REPAIR AND DISTAL CLAVICLE EXCISION  The risks benefits and alternatives were discussed with the patient including but not limited to the risks of nonoperative treatment, versus surgical intervention including infection, bleeding, nerve injury,  blood clots, cardiopulmonary complications, morbidity, mortality, among others, and they were willing to proceed.   The patient acknowledged the explanation, agreed to proceed with the plan and consent was signed.   Operative Plan: Right shoulder scope with biceps tenodesis, subacromial decompression, distal clavicle excision Discharge Medications: Standard DVT Prophylaxis: None Physical Therapy: Outpatient PT Special Discharge needs: Sling. [place], PA-C  12/17/2020 4:56 PM

## 2020-12-18 ENCOUNTER — Telehealth: Payer: Self-pay

## 2020-12-18 NOTE — Telephone Encounter (Signed)
Left message for pt to call back regarding OV to see Dr. Gwenlyn Found per Dr. Brigitte Pulse.

## 2020-12-18 NOTE — Telephone Encounter (Signed)
Spoke with pt regarding sooner appointment with Dr. Gwenlyn Found per Dr. Brigitte Pulse, however pt is having shoulder surgery tomorrow and is unavailable for later this week. Pt already has appointment with Sande Rives, PA-C for 11/29. Pt will plan to keep this appointment. Pt verbalizes understanding.

## 2020-12-19 ENCOUNTER — Encounter (HOSPITAL_BASED_OUTPATIENT_CLINIC_OR_DEPARTMENT_OTHER): Payer: Self-pay | Admitting: Orthopaedic Surgery

## 2020-12-19 ENCOUNTER — Other Ambulatory Visit: Payer: Self-pay

## 2020-12-19 ENCOUNTER — Ambulatory Visit (HOSPITAL_BASED_OUTPATIENT_CLINIC_OR_DEPARTMENT_OTHER)
Admission: RE | Admit: 2020-12-19 | Discharge: 2020-12-19 | Disposition: A | Discharge status: Home / Self Care | Payer: Medicaid Other | Attending: Orthopaedic Surgery | Admitting: Orthopaedic Surgery

## 2020-12-19 ENCOUNTER — Ambulatory Visit (HOSPITAL_BASED_OUTPATIENT_CLINIC_OR_DEPARTMENT_OTHER): Payer: Medicaid Other | Admitting: Anesthesiology

## 2020-12-19 ENCOUNTER — Encounter (HOSPITAL_BASED_OUTPATIENT_CLINIC_OR_DEPARTMENT_OTHER)
Admission: RE | Disposition: A | Discharge status: Home / Self Care | Payer: Self-pay | Source: Home / Self Care | Attending: Orthopaedic Surgery

## 2020-12-19 DIAGNOSIS — M7541 Impingement syndrome of right shoulder: Secondary | ICD-10-CM | POA: Insufficient documentation

## 2020-12-19 DIAGNOSIS — I2510 Atherosclerotic heart disease of native coronary artery without angina pectoris: Secondary | ICD-10-CM | POA: Insufficient documentation

## 2020-12-19 DIAGNOSIS — E109 Type 1 diabetes mellitus without complications: Secondary | ICD-10-CM | POA: Insufficient documentation

## 2020-12-19 DIAGNOSIS — M19011 Primary osteoarthritis, right shoulder: Secondary | ICD-10-CM | POA: Diagnosis not present

## 2020-12-19 DIAGNOSIS — M7521 Bicipital tendinitis, right shoulder: Secondary | ICD-10-CM | POA: Diagnosis not present

## 2020-12-19 DIAGNOSIS — E785 Hyperlipidemia, unspecified: Secondary | ICD-10-CM | POA: Insufficient documentation

## 2020-12-19 DIAGNOSIS — S43431A Superior glenoid labrum lesion of right shoulder, initial encounter: Secondary | ICD-10-CM | POA: Diagnosis not present

## 2020-12-19 DIAGNOSIS — X58XXXA Exposure to other specified factors, initial encounter: Secondary | ICD-10-CM | POA: Insufficient documentation

## 2020-12-19 DIAGNOSIS — K219 Gastro-esophageal reflux disease without esophagitis: Secondary | ICD-10-CM | POA: Diagnosis not present

## 2020-12-19 DIAGNOSIS — M25811 Other specified joint disorders, right shoulder: Secondary | ICD-10-CM | POA: Insufficient documentation

## 2020-12-19 HISTORY — PX: SHOULDER ARTHROSCOPY WITH SUBACROMIAL DECOMPRESSION AND BICEP TENDON REPAIR: SHX5689

## 2020-12-19 SURGERY — SHOULDER ARTHROSCOPY WITH SUBACROMIAL DECOMPRESSION AND BICEP TENDON REPAIR
Anesthesia: General | Laterality: Right

## 2020-12-19 MED ORDER — SODIUM CHLORIDE 0.9 % IR SOLN
Status: DC | PRN
  Administered 2020-12-19: 9000 mL

## 2020-12-19 MED ORDER — BUPIVACAINE HCL (PF) 0.5 % IJ SOLN
INTRAMUSCULAR | Status: DC | PRN
  Administered 2020-12-19: 15 mL via PERINEURAL

## 2020-12-19 MED ORDER — ROCURONIUM BROMIDE 100 MG/10ML IV SOLN
INTRAVENOUS | Status: DC | PRN
  Administered 2020-12-19: 70 mg via INTRAVENOUS

## 2020-12-19 MED ORDER — CEFAZOLIN SODIUM-DEXTROSE 2-4 GM/100ML-% IV SOLN
INTRAVENOUS | Status: AC
  Filled 2020-12-19: qty 100

## 2020-12-19 MED ORDER — OXYCODONE HCL 5 MG/5ML PO SOLN: 5.0000 mg | Freq: Once | ORAL | Status: DC | PRN

## 2020-12-19 MED ORDER — LIDOCAINE 2% (20 MG/ML) 5 ML SYRINGE
INTRAMUSCULAR | Status: AC
  Filled 2020-12-19: qty 5

## 2020-12-19 MED ORDER — CEFAZOLIN SODIUM-DEXTROSE 2-4 GM/100ML-% IV SOLN
2.0000 g | INTRAVENOUS | Status: AC
  Administered 2020-12-19: 2 g via INTRAVENOUS

## 2020-12-19 MED ORDER — AMISULPRIDE (ANTIEMETIC) 5 MG/2ML IV SOLN: 10.0000 mg | Freq: Once | INTRAVENOUS | Status: DC | PRN

## 2020-12-19 MED ORDER — ACETAMINOPHEN 500 MG PO TABS: 1000.0000 mg | ORAL_TABLET | Freq: Three times a day (TID) | ORAL | 0 refills | Status: DC

## 2020-12-19 MED ORDER — ONDANSETRON HCL 4 MG/2ML IJ SOLN
INTRAMUSCULAR | Status: DC | PRN
  Administered 2020-12-19: 4 mg via INTRAVENOUS

## 2020-12-19 MED ORDER — BUPIVACAINE LIPOSOME 1.3 % IJ SUSP
INTRAMUSCULAR | Status: DC | PRN
  Administered 2020-12-19: 10 mL

## 2020-12-19 MED ORDER — FENTANYL CITRATE (PF) 100 MCG/2ML IJ SOLN
INTRAMUSCULAR | Status: AC
  Filled 2020-12-19: qty 2

## 2020-12-19 MED ORDER — ROCURONIUM BROMIDE 10 MG/ML (PF) SYRINGE
PREFILLED_SYRINGE | INTRAVENOUS | Status: AC
  Filled 2020-12-19: qty 10

## 2020-12-19 MED ORDER — DEXAMETHASONE SODIUM PHOSPHATE 10 MG/ML IJ SOLN
INTRAMUSCULAR | Status: AC
  Filled 2020-12-19: qty 1

## 2020-12-19 MED ORDER — ONDANSETRON HCL 4 MG/2ML IJ SOLN: 4.0000 mg | Freq: Once | INTRAMUSCULAR | Status: DC | PRN

## 2020-12-19 MED ORDER — MIDAZOLAM HCL 2 MG/2ML IJ SOLN
INTRAMUSCULAR | Status: AC
  Filled 2020-12-19: qty 2

## 2020-12-19 MED ORDER — BUPIVACAINE-EPINEPHRINE (PF) 0.25% -1:200000 IJ SOLN
INTRAMUSCULAR | Status: AC
  Filled 2020-12-19: qty 30

## 2020-12-19 MED ORDER — MIDAZOLAM HCL 2 MG/2ML IJ SOLN
2.0000 mg | Freq: Once | INTRAMUSCULAR | Status: AC
  Administered 2020-12-19: 2 mg via INTRAVENOUS

## 2020-12-19 MED ORDER — PROPOFOL 500 MG/50ML IV EMUL
INTRAVENOUS | Status: AC
  Filled 2020-12-19: qty 50

## 2020-12-19 MED ORDER — FENTANYL CITRATE (PF) 100 MCG/2ML IJ SOLN
50.0000 ug | Freq: Once | INTRAMUSCULAR | Status: AC
  Administered 2020-12-19: 50 ug via INTRAVENOUS

## 2020-12-19 MED ORDER — PROPOFOL 10 MG/ML IV BOLUS
INTRAVENOUS | Status: DC | PRN
  Administered 2020-12-19: 160 mg via INTRAVENOUS

## 2020-12-19 MED ORDER — FENTANYL CITRATE (PF) 100 MCG/2ML IJ SOLN
25.0000 ug | INTRAMUSCULAR | Status: DC | PRN
  Administered 2020-12-19 (×2): 50 ug via INTRAVENOUS

## 2020-12-19 MED ORDER — ONDANSETRON HCL 4 MG PO TABS: 4.0000 mg | ORAL_TABLET | Freq: Three times a day (TID) | ORAL | 0 refills | Status: AC | PRN

## 2020-12-19 MED ORDER — ONDANSETRON HCL 4 MG/2ML IJ SOLN
INTRAMUSCULAR | Status: AC
  Filled 2020-12-19: qty 2

## 2020-12-19 MED ORDER — LACTATED RINGERS IV SOLN: INTRAVENOUS | Status: DC

## 2020-12-19 MED ORDER — LIDOCAINE HCL (CARDIAC) PF 100 MG/5ML IV SOSY
PREFILLED_SYRINGE | INTRAVENOUS | Status: DC | PRN
  Administered 2020-12-19: 40 mg via INTRAVENOUS

## 2020-12-19 MED ORDER — DEXAMETHASONE SODIUM PHOSPHATE 4 MG/ML IJ SOLN
INTRAMUSCULAR | Status: DC | PRN
Start: 2020-12-19 — End: 2020-12-19
  Administered 2020-12-19: 10 mg via INTRAVENOUS

## 2020-12-19 MED ORDER — EPINEPHRINE PF 1 MG/ML IJ SOLN
INTRAMUSCULAR | Status: AC
  Filled 2020-12-19: qty 4

## 2020-12-19 MED ORDER — PHENYLEPHRINE HCL (PRESSORS) 10 MG/ML IV SOLN
INTRAVENOUS | Status: AC
  Filled 2020-12-19: qty 2

## 2020-12-19 MED ORDER — SUGAMMADEX SODIUM 200 MG/2ML IV SOLN
INTRAVENOUS | Status: DC | PRN
  Administered 2020-12-19: 200 mg via INTRAVENOUS

## 2020-12-19 MED ORDER — OXYCODONE HCL 5 MG PO TABS: 5.0000 mg | ORAL_TABLET | Freq: Once | ORAL | Status: DC | PRN

## 2020-12-19 MED ORDER — OXYCODONE HCL 5 MG PO TABS: ORAL_TABLET | ORAL | 0 refills | Status: AC

## 2020-12-19 MED ORDER — CELECOXIB 100 MG PO CAPS: 100.0000 mg | ORAL_CAPSULE | Freq: Two times a day (BID) | ORAL | 0 refills | Status: AC

## 2020-12-19 SURGICAL SUPPLY — 62 items
AID PSTN UNV HD RSTRNT DISP (MISCELLANEOUS) ×1
ANCH SUT 2 FBRTK KNTLS 1.8 (Anchor) ×2 IMPLANT
ANCHOR SUT 1.8 FIBERTAK SB KL (Anchor) ×6 IMPLANT
APL PRP STRL LF DISP 70% ISPRP (MISCELLANEOUS) ×2
BLADE EXCALIBUR 4.0MM X 13CM (MISCELLANEOUS) ×1
BLADE EXCALIBUR 4.0X13 (MISCELLANEOUS) ×2 IMPLANT
BLADE SURG 10 STRL SS (BLADE) IMPLANT
BURR OVAL 8 FLU 4.0MM X 13CM (MISCELLANEOUS)
BURR OVAL 8 FLU 4.0X13 (MISCELLANEOUS) IMPLANT
CANNULA 5.75X71 LONG (CANNULA) IMPLANT
CANNULA PASSPORT 5 (CANNULA) IMPLANT
CANNULA PASSPORT 5CM (CANNULA)
CANNULA PASSPORT BUTTON 10-40 (CANNULA) ×3 IMPLANT
CANNULA TWIST IN 8.25X7CM (CANNULA) IMPLANT
CHLORAPREP W/TINT 26 (MISCELLANEOUS) ×6 IMPLANT
CLOSURE STERI-STRIP 1/2X4 (GAUZE/BANDAGES/DRESSINGS) ×1
CLSR STERI-STRIP ANTIMIC 1/2X4 (GAUZE/BANDAGES/DRESSINGS) ×2 IMPLANT
COOLER ICEMAN CLASSIC (MISCELLANEOUS) ×3 IMPLANT
DRAPE IMP U-DRAPE 54X76 (DRAPES) ×3 IMPLANT
DRAPE INCISE IOBAN 66X45 STRL (DRAPES) IMPLANT
DRAPE SHOULDER BEACH CHAIR (DRAPES) ×3 IMPLANT
DRSG PAD ABDOMINAL 8X10 ST (GAUZE/BANDAGES/DRESSINGS) ×3 IMPLANT
DW OUTFLOW CASSETTE/TUBE SET (MISCELLANEOUS) ×3 IMPLANT
GAUZE SPONGE 4X4 12PLY STRL (GAUZE/BANDAGES/DRESSINGS) ×3 IMPLANT
GLOVE SRG 8 PF TXTR STRL LF DI (GLOVE) ×1 IMPLANT
GLOVE SURG ENC MOIS LTX SZ6.5 (GLOVE) ×3 IMPLANT
GLOVE SURG LTX SZ8 (GLOVE) ×3 IMPLANT
GLOVE SURG UNDER POLY LF SZ6.5 (GLOVE) ×3 IMPLANT
GLOVE SURG UNDER POLY LF SZ8 (GLOVE) ×3
GOWN STRL REUS W/ TWL LRG LVL3 (GOWN DISPOSABLE) ×2 IMPLANT
GOWN STRL REUS W/TWL LRG LVL3 (GOWN DISPOSABLE) ×6
GOWN STRL REUS W/TWL XL LVL3 (GOWN DISPOSABLE) ×3 IMPLANT
KIT STABILIZATION SHOULDER (MISCELLANEOUS) ×3 IMPLANT
KIT STR SPEAR 1.8 FBRTK DISP (KITS) IMPLANT
LASSO CRESCENT QUICKPASS (SUTURE) IMPLANT
MANIFOLD NEPTUNE II (INSTRUMENTS) ×3 IMPLANT
NDL SAFETY ECLIPSE 18X1.5 (NEEDLE) ×1 IMPLANT
NEEDLE HYPO 18GX1.5 SHARP (NEEDLE) ×3
NEEDLE SCORPION MULTI FIRE (NEEDLE) IMPLANT
PACK ARTHROSCOPY DSU (CUSTOM PROCEDURE TRAY) ×3 IMPLANT
PACK BASIN DAY SURGERY FS (CUSTOM PROCEDURE TRAY) ×3 IMPLANT
PAD COLD SHLDR WRAP-ON (PAD) ×3 IMPLANT
PAD ORTHO SHOULDER 7X19 LRG (SOFTGOODS) ×3 IMPLANT
PORT APPOLLO RF 90DEGREE MULTI (SURGICAL WAND) ×3 IMPLANT
RESTRAINT HEAD UNIVERSAL NS (MISCELLANEOUS) ×3 IMPLANT
SHEET MEDIUM DRAPE 40X70 STRL (DRAPES) ×3 IMPLANT
SLEEVE SCD COMPRESS KNEE MED (STOCKING) ×3 IMPLANT
SLING ARM FOAM STRAP LRG (SOFTGOODS) IMPLANT
SUT FIBERWIRE #2 38 T-5 BLUE (SUTURE)
SUT MNCRL AB 4-0 PS2 18 (SUTURE) ×3 IMPLANT
SUT PDS AB 1 CT  36 (SUTURE)
SUT PDS AB 1 CT 36 (SUTURE) IMPLANT
SUT TIGER TAPE 7 IN WHITE (SUTURE) IMPLANT
SUTURE FIBERWR #2 38 T-5 BLUE (SUTURE) IMPLANT
SUTURE TAPE TIGERLINK 1.3MM BL (SUTURE) IMPLANT
SUTURETAPE TIGERLINK 1.3MM BL (SUTURE)
SYR 5ML LL (SYRINGE) ×3 IMPLANT
TAPE FIBER 2MM 7IN #2 BLUE (SUTURE) IMPLANT
TOWEL GREEN STERILE FF (TOWEL DISPOSABLE) ×6 IMPLANT
TUBE CONNECTING 20'X1/4 (TUBING) ×2
TUBE CONNECTING 20X1/4 (TUBING) ×4 IMPLANT
TUBING ARTHROSCOPY IRRIG 16FT (MISCELLANEOUS) ×3 IMPLANT

## 2020-12-19 NOTE — Discharge Instructions (Addendum)
Ophelia Charter MD, MPH Noemi Chapel, PA-C [facility] [address] [phone] (tel)   [phone] (fax)   POST-OPERATIVE INSTRUCTIONS - SHOULDER ARTHROSCOPY  WOUND CARE You may remove the Operative Dressing on Post-Op Day #3 (72hrs after surgery).   Alternatively if you would like you can leave dressing on until follow-up if within 7-8 days but keep it dry. Leave steri-strips in place until they fall off on their own, usually 2 weeks postop. There may be a small amount of fluid/bleeding leaking at the surgical site.  This is normal; the shoulder is filled with fluid during the procedure and can leak for 24-48hrs after surgery.  You may change/reinforce the bandage as needed.  Use the Cryocuff or Ice as often as possible for the first 7 days, then as needed for pain relief. Always keep a towel, ACE wrap or other barrier between the cooling unit and your skin.  You may shower on Post-Op Day #3. Gently pat the area dry. Do not soak the shoulder in water or submerge it. Keep incisions as dry as possible. Do not go swimming in the pool or ocean until 4 weeks after surgery or when otherwise instructed.    EXERCISES/BRACING Sling should be used at all times until follow-up. (Including when you sleep!) You can remove sling for hygiene.    Please continue to ambulate and do not stay sitting or lying for too long. Perform foot and wrist pumps to assist in circulation.  POST-OP MEDICATIONS- Multimodal approach to pain control In general your pain will be controlled with a combination of substances.  Prescriptions unless otherwise discussed are electronically sent to your pharmacy.  This is a carefully made plan we use to minimize narcotic use.     Celebrex - Anti-inflammatory medication taken on a scheduled basis Acetaminophen - Non-narcotic pain medicine taken on a scheduled basis  Oxycodone - This is a strong narcotic, to be used only on an "as needed" basis  for SEVERE pain. Zofran - take as needed for nausea   FOLLOW-UP If you develop a Fever (?101.5), Redness or Drainage from the surgical incision site, please call our office to arrange for an evaluation. Please call the office to schedule a follow-up appointment for your wound check after surgery, 10-14 days post-operatively.    HELPFUL INFORMATION  If you had a block, it will wear off between 8-24 hrs postop typically.  This is period when your pain may go from nearly zero to the pain you would have had postop without the block.  This is an abrupt transition but nothing dangerous is happening.  You may take an extra dose of narcotic when this happens.  You may be more comfortable sleeping in a semi-seated position the first few nights following surgery.  Keep a pillow propped under the elbow and forearm for comfort.  If you have a recliner type of chair it might be beneficial.  If not that is fine too, but it would be helpful to sleep propped up with pillows behind your operated shoulder as well under your elbow and forearm.  This will reduce pulling on the suture lines.  When dressing, put your operative arm in the sleeve first.  When getting undressed, take your operative arm out last.  Loose fitting, button-down shirts are recommended.  Often in the first days after surgery you may be more comfortable keeping your operative arm under your shirt and not through the sleeve.  You may return to work/school  in the next couple of days when you feel up to it.  Desk work and typing in the sling is fine.  We suggest you use the pain medication the first night prior to going to bed, in order to ease any pain when the anesthesia wears off. You should avoid taking pain medications on an empty stomach as it will make you nauseous.  You should wean off your narcotic medicines as soon as you are able.  Most patients will be off or using minimal narcotics before their first postop appointment.   Do not  drink alcoholic beverages or take illicit drugs when taking pain medications.  It is against the law to drive while taking narcotics.  In some states it is against the law to drive while your arm is in a sling.   Pain medication may make you constipated.  Below are a few solutions to try in this order: Decrease the amount of pain medication if you aren't having pain. Drink lots of decaffeinated fluids. Drink prune juice and/or eat dried prunes  If the first 3 don't work start with additional solutions Take Colace - an over-the-counter stool softener Take Senokot - an over-the-counter laxative Take Miralax - a stronger over-the-counter laxative  For more information including helpful videos and documents visit our website:   [email or web address]-information.[email or web address]     Post Anesthesia Home Care Instructions  Activity: Get plenty of rest for the remainder of the day. A responsible individual must stay with you for 24 hours following the procedure.  For the next 24 hours, DO NOT: -Drive a car -Paediatric nurse -Drink alcoholic beverages -Take any medication unless instructed by your physician -Make any legal decisions or sign important papers.  Meals: Start with liquid foods such as gelatin or soup. Progress to regular foods as tolerated. Avoid greasy, spicy, heavy foods. If nausea and/or vomiting occur, drink only clear liquids until the nausea and/or vomiting subsides. Call your physician if vomiting continues.  Special Instructions/Symptoms: Your throat may feel dry or sore from the anesthesia or the breathing tube placed in your throat during surgery. If this causes discomfort, gargle with warm salt water. The discomfort should disappear within 24 hours.  If you had a scopolamine patch placed behind your ear for the management of post- operative nausea and/or vomiting:  1. The medication in the patch is effective for 72 hours, after which it should be removed.   Wrap patch in a tissue and discard in the trash. Wash hands thoroughly with soap and water. 2. You may remove the patch earlier than 72 hours if you experience unpleasant side effects which may include dry mouth, dizziness or visual disturbances. 3. Avoid touching the patch. Wash your hands with soap and water after contact with the patch.     Regional Anesthesia Blocks  1. Numbness or the inability to move the "blocked" extremity may last from 3-48 hours after placement. The length of time depends on the medication injected and your individual response to the medication. If the numbness is not going away after 48 hours, call your surgeon.  2. The extremity that is blocked will need to be protected until the numbness is gone and the  Strength has returned. Because you cannot feel it, you will need to take extra care to avoid injury. Because it may be weak, you may have difficulty moving it or using it. You may not know what position it is in without looking at it while the block is in effect.  3. For blocks in the legs and feet, returning to weight bearing and walking needs to be done carefully. You will need to wait until the numbness is entirely gone and the strength has returned. You should be able to move your leg and foot normally before you try and bear weight or walk. You will need someone to be with you when you first try to ensure you do not fall and possibly risk injury.  4. Bruising and tenderness at the needle site are common side effects and will resolve in a few days.  5. Persistent numbness or new problems with movement should be communicated to the surgeon or the [facility] [phone] [place] [phone]).   Information for Discharge Teaching: EXPAREL (bupivacaine liposome injectable suspension)   Your surgeon or anesthesiologist gave you EXPAREL(bupivacaine) to help control your pain after surgery.  EXPAREL is a local anesthetic that provides  pain relief by numbing the tissue around the surgical site. EXPAREL is designed to release pain medication over time and can control pain for up to 72 hours. Depending on how you respond to EXPAREL, you may require less pain medication during your recovery.  Possible side effects: Temporary loss of sensation or ability to move in the area where bupivacaine was injected. Nausea, vomiting, constipation Rarely, numbness and tingling in your mouth or lips, lightheadedness, or anxiety may occur. Call your doctor right away if you think you may be experiencing any of these sensations, or if you have other questions regarding possible side effects.  Follow all other discharge instructions given to you by your surgeon or nurse. Eat a healthy diet and drink plenty of water or other fluids.  If you return to the hospital for any reason within 96 hours following the administration of EXPAREL, it is important for health care providers to know that you have received this anesthetic. A teal colored band has been placed on your arm with the date, time and amount of EXPAREL you have received in order to alert and inform your health care providers. Please leave this armband in place for the full 96 hours following administration, and then you may remove the band.

## 2020-12-19 NOTE — Anesthesia Postprocedure Evaluation (Signed)
Anesthesia Post Note  Patient: Cristian Fuller  Procedure(s) Performed: SHOULDER ARTHROSCOPY WITH SUBACROMIAL DECOMPRESSION AND BICEP TENDON REPAIR AND DISTAL CLAVICLE EXCISION (Right)     Patient location during evaluation: PACU Anesthesia Type: General Level of consciousness: awake and alert Pain management: pain level controlled Vital Signs Assessment: post-procedure vital signs reviewed and stable Respiratory status: spontaneous breathing, nonlabored ventilation and respiratory function stable Cardiovascular status: blood pressure returned to baseline and stable Postop Assessment: no apparent nausea or vomiting Anesthetic complications: no   No notable events documented.  Last Vitals:  Vitals:   12/19/20 1145 12/19/20 1150  BP: 138/78   Pulse: (!) 55 (!) 49  Resp: 13 11  Temp:    SpO2: 93% 95%    Last Pain:  Vitals:   12/19/20 1145  TempSrc:   PainSc: [place]

## 2020-12-19 NOTE — Anesthesia Procedure Notes (Signed)
Anesthesia Regional Block: Interscalene brachial plexus block   Pre-Anesthetic Checklist: , timeout performed,  Correct Patient, Correct Site, Correct Laterality,  Correct Procedure, Correct Position, site marked,  Risks and benefits discussed,  Surgical consent,  Pre-op evaluation,  At surgeon's request and post-op pain management  Laterality: Right  Prep: chloraprep       Needles:  Injection technique: Single-shot  Needle Type: Echogenic Stimulator Needle     Needle Length: 10cm  Needle Gauge: 20     Additional Needles:   Procedures:,,,, ultrasound used (permanent image in chart),,    Narrative:  Start time: 12/19/2020 8:57 AM End time: 12/19/2020 9:01 AM Injection made incrementally with aspirations every 5 mL.  Performed by: Personally  Anesthesiologist: Lidia Collum, MD  Additional Notes: Standard monitors applied. Skin prepped. Good needle visualization with ultrasound. Injection made in 5cc increments with no resistance to injection. Patient tolerated the procedure well.

## 2020-12-19 NOTE — Progress Notes (Signed)
Assisted Dr. Christella Hartigan with right, ultrasound guided, interscalene  block. Side rails up, monitors on throughout procedure. See vital signs in flow sheet. Tolerated Procedure well.

## 2020-12-19 NOTE — Anesthesia Preprocedure Evaluation (Signed)
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   Neuro/Psych negative neurological ROS     GI/Hepatic Neg liver ROS, GERD  ,  Endo/Other  negative endocrine ROS  Renal/GU negative Renal ROS  negative genitourinary   Musculoskeletal negative musculoskeletal ROS (+)   Abdominal   Peds  Hematology negative hematology ROS (+)   Anesthesia Other Findings   Reproductive/Obstetrics                            Anesthesia Physical Anesthesia Plan  ASA: 2  Anesthesia Plan: General   Post-op Pain Management: GA combined w/ Regional for post-op pain   Induction: Intravenous  PONV Risk Score and Plan: 2 and Ondansetron, Dexamethasone, Treatment may vary due to age or medical condition and Midazolam  Airway Management Planned: Oral ETT  Additional Equipment: None  Intra-op Plan:   Post-operative Plan: Extubation in OR  Informed Consent: I have reviewed the patients History and Physical, chart, labs and discussed the procedure including the risks, benefits and alternatives for the proposed anesthesia with the patient or authorized representative who has indicated his/her understanding and acceptance.     Dental advisory given  Plan Discussed with:   Anesthesia Plan  Comments:         Anesthesia Quick Evaluation

## 2020-12-19 NOTE — Transfer of Care (Signed)
Immediate Anesthesia Transfer of Care Note  Patient: Cristian Fuller  Procedure(s) Performed: SHOULDER ARTHROSCOPY WITH SUBACROMIAL DECOMPRESSION AND BICEP TENDON REPAIR AND DISTAL CLAVICLE EXCISION (Right)  Patient Location: PACU  Anesthesia Type:GA combined with regional for post-op pain  Level of Consciousness: sedated  Airway & Oxygen Therapy: Patient Spontanous Breathing and Patient connected to face mask oxygen  Post-op Assessment: Report given to RN and Post -op Vital signs reviewed and stable  Post vital signs: Reviewed and stable  Last Vitals:  Vitals Value Taken Time  BP 166/89 12/19/20 1107  Temp    Pulse 53 12/19/20 1109  Resp 17 12/19/20 1109  SpO2 99 % 12/19/20 1109  Vitals shown include unvalidated device data.  Last Pain:  Vitals:   12/19/20 0803  TempSrc: Oral  PainSc: 0-No pain         Complications: No notable events documented.

## 2020-12-19 NOTE — Interval H&P Note (Signed)
All questions answered, patient wants to proceed with procedure. ? ?

## 2020-12-19 NOTE — Op Note (Signed)
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, impingement and AC arthrosis  Procedure: Arthroscopic extensive debridement - Debrided areas: Labrum, cyst, bone and bursal tissue Arthroscopic subacromial decompression Arthroscopic biceps tenodesis Arthroscopic distal clavicle excision   Operative Finding Exam under anesthesia: Full motion no limitation no instability Articular space: No loose bodies, capsule intact, significant anterior superior and posterior superior labral fraying Chondral surfaces:Intact, no sign of chondral degeneration on the glenoid or humeral head Biceps: Type II SLAP tear bordering on type III Subscapularis: Normal Superior Cuff: Normal Bursal side: Normal  Successful completion of the planned procedure.  Patient's labral appearance was such that it may have created a one-way valve and allowed for paralabral cyst to form.  We decompressed and opened the superior labrum to allow this to avoid accumulation.  Routine biceps tenodesis.   Post-operative plan: The patient will be non-weightbearing in a sling 4 weeks following biceps tenodesis protocol.  The patient will be discharged home.  DVT prophylaxis not indicated in ambulatory upper extremity patient without known risk factors.   Pain control with PRN pain medication preferring oral medicines.  Follow up plan will be scheduled in approximately 7 days for incision check and XR.  Post-Op Diagnosis: Same Surgeons:Primary: Hiram Gash, MD Assistants:Caroline McBane PA-C Location: [facility] ROOM 1 Anesthesia: General with Exparel interscalene block Antibiotics: Ancef 2 g Tourniquet time: None Estimated Blood Loss: Minimal Complications: None Specimens: None Implants: Implant Name Type Inv. Item Serial No. Manufacturer Lot No. LRB No. Used Action  ANCHOR SUT 1.8 FIBERTAK SB KL - [id number]  Anchor ANCHOR SUT 1.8 FIBERTAK SB KL  [employer] [id number] Right 1 Implanted  ANCHOR SUT 1.8 FIBERTAK SB KL - [id number] Anchor ANCHOR SUT 1.8 [employer] [id number] Right 1 Implanted    Indications for Surgery:   Kesler Wickham is a 60 y.o. male with continued shoulder pain refractory to nonoperative measures for extended period of time.    The risks and benefits were explained at length including but not limited to continued pain, cuff failure, biceps tenodesis failure, stiffness, need for further surgery and infection.   Procedure:   Patient was correctly identified in the preoperative holding area and operative site marked.  Patient brought to OR and positioned beachchair on an [place] table ensuring that all bony prominences were padded and the head was in an appropriate location.  Anesthesia was induced and the operative shoulder was prepped and draped in the usual sterile fashion.  Timeout was called preincision.  A standard posterior viewing portal was made after localizing the portal with a spinal needle.  An anterior accessory portal was also made.  After clearing the articular space the camera was positioned in the subacromial space.  Findings above.    Extensive debridement was performed of the anterior interval tissue, labral fraying and the bursa.  We additionally debrided the superior labrum extensively to avoid reaccumulation of the paralabral cyst.  We were able to stick our shaver into the cyst itself.  Subacromial decompression: We made a lateral portal with spinal needle guidance. We then proceeded to debride bursal tissue extensively with a shaver and arthrocare device. At that point we continued to identify the borders of the acromion and identify the spur. We then carefully preserved the deltoid fascia and used a burr to convert the acromion to a Type 1 flat acromion without  issue.  Biceps tenodesis: We marked the tendon and then performed a tenotomy and debridement  of the stump in the articular space. We then identified the biceps tendon in its groove suprapec with the arthroscope in the lateral portal taking care to move from lateral to medial to avoid injury to the subscapularis. At that point we unroofed the tendon itself and mobilized it. An accessory anterior portal was made in line with the tendon and we grasped it from the anterior superior portal and worked from the accessory anterior portal. Two Fibertak 1.38mm knotless anchors were placed in the groove and the tendon was secured in a luggage loop style fashion with a pass of the limb of suture through the tendon using a scorpion device to avoid pull-through.  Repair was completed with good tension on the tendon.  Residual stump of the tendon was removed after being resected with a RF ablator.  Distal Clavicle resection:  The scope was placed in the subacromial space from the posterior portal.  A hemostat was placed through the anterior portal and we spread at the [facility] joint.  A burr was then inserted and 10 mm of distal clavicle was resected taking care to avoid damage to the capsule around the joint and avoiding overhanging bone posteriorly.      The incisions were closed with absorbable monocryl and steri strips.  A sterile dressing was placed along with a sling. The patient was awoken from general anesthesia and taken to the PACU in stable condition without complication.   Noemi Chapel, PA-C, present and scrubbed throughout the case, critical for completion in a timely fashion, and for retraction, instrumentation, closure.

## 2020-12-19 NOTE — Anesthesia Procedure Notes (Signed)
Procedure Name: Intubation Date/Time: 12/19/2020 9:59 AM Performed by: Maryella Shivers, CRNA Pre-anesthesia Checklist: Patient identified, Emergency Drugs available, Suction available and Patient being monitored Patient Re-evaluated:Patient Re-evaluated prior to induction Oxygen Delivery Method: Circle system utilized Preoxygenation: Pre-oxygenation with 100% oxygen Induction Type: IV induction Ventilation: Mask ventilation without difficulty Laryngoscope Size: Mac and 3 Grade View: Grade I Tube type: Oral Tube size: 7.5 mm Number of attempts: 1 Airway Equipment and Method: Stylet and Oral airway Placement Confirmation: ETT inserted through vocal cords under direct vision, positive ETCO2 and breath sounds checked- equal and bilateral Secured at: 21 cm Tube secured with: Tape Dental Injury: Teeth and Oropharynx as per pre-operative assessment

## 2020-12-20 ENCOUNTER — Encounter (HOSPITAL_BASED_OUTPATIENT_CLINIC_OR_DEPARTMENT_OTHER): Payer: Self-pay | Admitting: Orthopaedic Surgery

## 2020-12-26 ENCOUNTER — Ambulatory Visit: Payer: Medicaid Other | Attending: Orthopaedic Surgery | Admitting: Physical Therapy

## 2020-12-26 ENCOUNTER — Encounter: Payer: Self-pay | Admitting: Physical Therapy

## 2020-12-26 ENCOUNTER — Other Ambulatory Visit: Payer: Self-pay

## 2020-12-26 DIAGNOSIS — M25511 Pain in right shoulder: Secondary | ICD-10-CM | POA: Insufficient documentation

## 2020-12-26 DIAGNOSIS — M6281 Muscle weakness (generalized): Secondary | ICD-10-CM | POA: Diagnosis present

## 2020-12-26 DIAGNOSIS — M25611 Stiffness of right shoulder, not elsewhere classified: Secondary | ICD-10-CM | POA: Insufficient documentation

## 2020-12-26 NOTE — Patient Instructions (Signed)
Access Code: [email or web address] URL: [email or web address] Date: 12/26/2020 Prepared by: Glenetta Hew  Exercises Putty Squeezes - 3 x daily - 7 x weekly - 1 sets - 10 reps Seated Scapular Retraction - 3 x daily - 7 x weekly - 1 sets - 10 reps - 5 sec hold Flexion-Extension Shoulder Pendulum with Table Support - 3 x daily - 7 x weekly - 1 sets - 10 reps

## 2020-12-26 NOTE — Therapy (Signed)
[facility] [address]  [name], [place], [address] Phone: [phone]   Fax:  [phone]  Physical Therapy Evaluation  Patient Details  Name: Cristian Fuller MRN: [id number] Date of Birth: 03-09-1960 Referring Provider (PT): Ophelia Charter   Encounter Date: 12/26/2020   PT End of Session - 12/26/20 0852     Visit Number 1    Number of Visits 24    Date for PT Re-Evaluation 02/06/21    Authorization Type [employer]    PT Start Time 0802    PT Stop Time 7169    PT Time Calculation (min) 41 min    Equipment Utilized During Treatment Other (comment)   shoulder immobilizer   Activity Tolerance Patient tolerated treatment well    Behavior During Therapy [facility] for tasks assessed/performed             Past Medical History:  Diagnosis Date   Allergic rhinitis    Hyperlipidemia    Left varicocele    history of left varicocele    Past Surgical History:  Procedure Laterality Date   BACK SURGERY     ESOPHAGOGASTRODUODENOSCOPY  2002/2007   LEFT HEART CATH AND CORONARY ANGIOGRAPHY N/A 10/24/2019   Procedure: LEFT HEART CATH AND CORONARY ANGIOGRAPHY;  Surgeon: Lorretta Harp, MD;  Location: [facility];  Service: Cardiovascular;  Laterality: N/A;   SHOULDER ARTHROSCOPY WITH SUBACROMIAL DECOMPRESSION AND BICEP TENDON REPAIR Right 12/19/2020   Procedure: SHOULDER ARTHROSCOPY WITH SUBACROMIAL DECOMPRESSION AND BICEP TENDON REPAIR AND DISTAL CLAVICLE EXCISION;  Surgeon: Hiram Gash, MD;  Location: [facility];  Service: Orthopedics;  Laterality: Right;    There were no vitals filed for this visit.    Subjective Assessment - 12/26/20 0812     Subjective Patient had R shoulder ARTHROSCOPY WITH SUBACROMIAL DECOMPRESSION AND BICEP TENDON REPAIR AND DISTAL CLAVICLE EXCISION    Currently in Pain? Yes    Pain Score 4     Pain Location Shoulder    Pain Orientation Right    Pain  Descriptors / Indicators Aching    Pain Type Surgical pain    Pain Onset 1 to 4 weeks ago    Pain Frequency Constant    Aggravating Factors  moving shoulder    Pain Relieving Factors medication    Effect of Pain on Daily Activities difficulty with ADLs                [facility] PT Assessment - 12/26/20 0001       Assessment   Medical Diagnosis M75.41 (ICD-10-CM) - Impingement syndrome of right shoulder  S42.031A (ICD-10-CM) - Displaced fracture of lateral end of right clavicle  M75.21 (ICD-10-CM) - Biceps tendonitis on right    Referring Provider (PT) Griffin Basil, Dax    Onset Date/Surgical Date 12/19/20    Hand Dominance Right    Prior Therapy yes for shoulder pain      Precautions   Precautions Shoulder    Type of Shoulder Precautions post-surgical precautions, no cross-body adduction x 6 weeks, no shoulder abduction x 6 weeks, no resisted elbow motions x 3 weeks.  Sling for 4 weeks, less than 5lbs weight bearing until 8 weeks.    Required Braces or Orthoses Sling   for 4 weeks until 01/16/21     Restrictions   Weight Bearing Restrictions No      Balance Screen   Has the patient fallen in the past 6 months No  Has the patient had a decrease in activity level because of a fear of falling?  No    Is the patient reluctant to leave their home because of a fear of falling?  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    Skin Integrity well healing incisions x 4 R shoulder    Focus on Therapeutic  Outcomes (FOTO)  shoulder: 4      ROM / Strength   AROM / PROM / Strength PROM;Strength      PROM   Overall PROM  Deficits    Overall PROM Comments formal ROM deferred today for R shoulder due to post surgical protocol, L shoulder WNL      Strength   Overall Strength Deficits    Overall Strength Comments formal MMT deferred today due to post-surgical protocol, L shoulder strength WNL                        Objective measurements completed on examination: See above findings.       [facility] Adult PT Treatment/Exercise - 12/26/20 0001       Self-Care   Self-Care Other Self-Care Comments    Other Self-Care Comments  see patient instructions      Shoulder Exercises: Seated   Other Seated Exercises scap squeezes (gentle), theraputty for grip strengthening.    Other Seated Exercises demo pendulums in standing                     PT Education - 12/26/20 0846     Education Details Educated on anatomy and surgery, plan of care, initial HEP.  Issued Yellow theraputty.    Person(s) Educated Patient    Methods Explanation;Demonstration;Handout    Comprehension Verbalized understanding;Returned demonstration              PT Short Term Goals - 12/26/20 1049       PT SHORT TERM GOAL #1   Title Patient to be independent with initial HEP.    Time 2    Period Weeks    Status New    Target Date 01/09/21      PT SHORT TERM GOAL #2   Title Patient will demonstrate improved R shoulder ROM to 140 deg foward flexion without pain.    Time 8    Period Weeks    Status New    Target Date 02/20/21               PT Long Term Goals - 12/26/20 1049       PT LONG TERM GOAL #1   Title Patient to be independent with advanced HEP to progress to gym based exercise program.    Time 12    Period Weeks    Status New    Target Date 03/20/21      PT LONG TERM GOAL #2   Title Patient to demonstrate R UE strength >/=4+/5  without pain to be able to start  gentle resistance training    Time 8    Period Weeks    Status New    Target Date 02/20/21      PT LONG TERM GOAL #3   Title Patient to demonstrate R shoulder AROM WFL and without pain limiting.    Time 12    Period Weeks    Status New    Target Date 03/20/21      PT LONG TERM GOAL #4   Title Patient will report >48 on FOTO to demonstrate improved function of R shoulder    Baseline 4    Time 12    Period Weeks    Status New    Target Date 03/20/21                    Plan - 12/26/20 5188     Clinical Impression Statement Cristian Fuller is a 60 year old male referred for PT following R shoulder arthroscopophy with subacromial decompresssion, bicep tendon repair and distal clavicle excision for chronic R shoulder pain/impingement on 12/19/2020.  He arrived today with sling.  He had many questions on surgery, x-rays, and surgical pictures, so was educated on shoulder anatomy with model.  He was given initial HEP focusing on grip strength, pendulums and periscapular strengthening and issued yellow theraputty.  He demonstrates decreased strength, ROM, and pain in R shoulder and would benefit from skilled physical therapy for strengthening and ROM in order to be able to return to work.    Personal Factors and Comorbidities Comorbidity 2;Social Background    Comorbidities history chronic L shoulder pain, history of heart disease    Examination-Activity Limitations Bathing;Lift;Carry;Reach Overhead;Dressing    Examination-Participation Restrictions Cleaning;Laundry;Occupation;Driving;Yard Work;Meal Prep    Stability/Clinical Decision Making Stable/Uncomplicated    Clinical Decision Making Low    Rehab Potential Excellent    PT Frequency 2x / week    PT Duration 12 weeks    PT Treatment/Interventions ADLs/Self Care Home Management;Cryotherapy;Electrical Stimulation;Moist Heat;Therapeutic activities;Therapeutic exercise;Neuromuscular re-education;Patient/family education;Scar  mobilization;Passive range of motion;Dry needling;Taping;Vasopneumatic Device;Joint Manipulations    PT Next Visit Plan review and progress HEP per surgical protocol.  Surgery date 12/19/20, phase 1 to 01/16/21.    PT Home Exercise Plan Access Code: [email or web address]    Consulted and Agree with Plan of Care Patient             Patient will benefit from skilled therapeutic intervention in order to improve the following deficits and impairments:  Decreased endurance, Decreased skin integrity, Increased muscle spasms, Decreased range of motion, Decreased scar mobility, Decreased activity tolerance, Decreased strength, Increased fascial restricitons, Impaired flexibility, Impaired UE functional use, Pain  Visit Diagnosis: Acute pain of right shoulder  Stiffness of right shoulder, not elsewhere classified  Muscle weakness (generalized)     Problem List Patient Active Problem List   Diagnosis Date Noted   Chronic right shoulder pain 07/17/2020   Left tennis elbow 07/17/2020   Numbness and tingling in both hands 07/17/2020   Atypical chest pain 10/18/2019   Hemoptysis 10/18/2012   Preventative health care 04/07/2011   NUMBNESS, HAND 12/27/2007   HYPERLIPIDEMIA 12/24/2006   ALLERGIC RHINITIS 12/24/2006   GERD 12/24/2006   TB SKIN TEST, POSITIVE 12/24/2006    Rennie Natter, PT, DPT   12/26/2020, 11:09 AM  [facility] [address]  [place], [place], [address] Phone: [phone]   Fax:  [phone]  Name: Cristian Fuller MRN: [id number] Date of Birth: 12/26/1960

## 2020-12-28 NOTE — H&P (View-Only) (Signed)
Cardiology Office Note:    Date:  01/01/2021   ID:  Cristian Fuller, DOB [birth year], MRN [id number]  PCP:  Sharon Seller, MD  Cardiologist:  Dr. Brigitte Pulse at [facility] Electrophysiologist:  None   Referring MD: Astrid Divine, MD   Chief Complaint: chest pain  History of Present Illness:    Cristian Fuller is a 60 y.o. male with a history of chest pain with 75% stenosis of Diag branch noted on cardiac catheterization in 10/2019 (treated medically) and hyperlipidemia who is followed by Dr. Brigitte Pulse at [facility] and presents today for evaluation of chest pain.  Patient's primary Cardiologist is Dr. Brigitte Pulse. He was referred to Dr. Gwenlyn Found in 10/2019 for a diagnostic coronary angiography to define his anatomy after negative Myoview in 07/2019 and continued chest pain. Echo in 06/2019 showed LVEF of >75% with normal diastolic function and no significant valvular disease. Dr. Gwenlyn Found performed a cardiac catheterization on 10/24/2019 which showed 75% stenosis of a small to medium size 1st Diag but no other CAD. Medical therapy was recommend first reserving intervention for recalcitrant symptoms.   Patient was seen in the ED on 11/17/2020 for chest pain that improved with sublingual Nitro. Troponin negative at that time and EKG unremarkable. He was felt to be stable for discharge with close follow-up with Cardiology. He was seen by Dr. Brigitte Pulse on 11/24/2020 at which time he described pressure-like and squeezing chest pain that radiated down his left arm and occurred with emotional excitement and exertion. He also reported exertional mid epigastric pain to Dr. Brigitte Pulse that would relieve with rest. He was started on low dose Imdur 10mg  daily due to soft BP and was referred back to Dr. Gwenlyn Found for consideration of repeat cardiac catheterization with possible PCI.  Patient is here today alone. He reports continued substernal non-radiating chest pain that he describes as a "squeezing/choking" sensation. No  associated shortness of breath, nausea, or vomiting. Occurs when he is rushing or moving fast. He states this is the same pain that he has had for the last couple of years. It is not getting worse in severity but is occurring more frequently. He denies any significant improvement with Imdur. He is very interested in having a repeat cardiac catheterization and potentially fixing the Diagonal branch lesion. He denies any significant shortness of breath. No orthopnea, PND, or lower extremity edema. No palpitations, dizziness, or syncope. He does report occasional leg cramps at night as well as intermittent dull back pain all the way across his mid/upper back (from left to right) that occurs at rest. He states back pain as been going on for about 1 year. Advised following up with PCP.  Past Medical History:  Diagnosis Date   Allergic rhinitis    Hyperlipidemia    Left varicocele    history of left varicocele    Past Surgical History:  Procedure Laterality Date   BACK SURGERY     ESOPHAGOGASTRODUODENOSCOPY  2002/2007   LEFT HEART CATH AND CORONARY ANGIOGRAPHY N/A 10/24/2019   Procedure: LEFT HEART CATH AND CORONARY ANGIOGRAPHY;  Surgeon: Lorretta Harp, MD;  Location: [place] [facility];  Service: Cardiovascular;  Laterality: N/A;   SHOULDER ARTHROSCOPY WITH SUBACROMIAL DECOMPRESSION AND BICEP TENDON REPAIR Right 12/19/2020   Procedure: SHOULDER ARTHROSCOPY WITH SUBACROMIAL DECOMPRESSION AND BICEP TENDON REPAIR AND DISTAL CLAVICLE EXCISION;  Surgeon: Hiram Gash, MD;  Location: [place];  Service: Orthopedics;  Laterality: Right;    Current Medications: Current  Meds  Medication Sig   celecoxib (CELEBREX) 100 MG capsule Take 1 capsule (100 mg total) by mouth 2 (two) times daily. For 2 weeks. Then take as needed   famotidine (PEPCID) 20 MG tablet Take 1 tablet (20 mg total) by mouth 2 (two) times daily. Takes 30 minutes before breakfast and dinner   Isosorbide Mononitrate  (IMDUR PO) Take 10 mg by mouth daily.   nitroGLYCERIN (NITROSTAT) 0.4 MG SL tablet Place 1 tablet (0.4 mg total) under the tongue once as needed for up to 1 dose for chest pain.   rosuvastatin (CRESTOR) 5 MG tablet Take 5 mg by mouth daily. TAKING 10 MG DAILY   Vitamin D, Ergocalciferol, (DRISDOL) 1.25 MG (50000 UNIT) CAPS capsule Take 50,000 Units by mouth once a week.     Allergies:   Patient has no known allergies.   Social History   Socioeconomic History   Marital status: Married    Spouse name: Not on file   Number of children: Not on file   Years of education: Not on file   Highest education level: Not on file  Occupational History   Not on file  Tobacco Use   Smoking status: Never   Smokeless tobacco: Never  Substance and Sexual Activity   Alcohol use: No   Drug use: No   Sexual activity: Not on file  Other Topics Concern   Not on file  Social History Narrative   Occupation: Self employed   Married   Never Smoked    Alcohol use-no     Caffeine use/day:  2-3 times per week   Does Patient Exercise:  no   Social Determinants of Radio broadcast assistant Strain: Not on file  Food Insecurity: Not on file  Transportation Needs: Not on file  Physical Activity: Not on file  Stress: Not on file  Social Connections: Not on file     Family History: The patient's family history includes Cancer in his unknown relative; Colon cancer in his paternal aunt; Pancreatic cancer in his unknown relative; Rectal cancer in his unknown relative; Stomach cancer in his paternal uncle and unknown relative; Stroke in his unknown relative.  ROS:   Please see the history of present illness.     EKGs/Labs/Other Studies Reviewed:    The following studies were reviewed today:  Myoview 07/18/2019: There was no ST segment deviation noted during stress. The study is normal. This is a low risk study. Nuclear stress EF: 65%. The left ventricular ejection fraction is normal (55-65%).    1. This is a normal study without ischemia or infarction.  2. Normal LVEF, 65%.  3. This is a low-risk study.  _______________  Cardiac Catheterization 10/24/2019: 1st Diag lesion is 75% stenosed.  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 He has no other CAD and normal LV function.  I believe initial strategy of antianginal therapy reserving intervention for recalcitrant symptoms. I discussed this with Dr. Brigitte Pulse, the varying cardiologist, who agrees with this plan.  The sheath was removed and a TR band was placed on the right wrist to achieve patent hemostasis.  The patient left lab in stable condition.  He will be discharged home early this afternoon as an outpatient and will follow up with Dr. Manuella Ghazi who will initiate antianginal medications.  Diagnostic Dominance: Right    EKG:  EKG ordered today. EKG personally reviewed and demonstrates normal sinus rhythm, rate 63  bpm, with 1st degree AV block, incomplete RBBB, and underlying artifact but no acute ST/T changes. Normal axis. QTc 399 ms.   Recent Labs: 11/17/2020: BUN 14; Creatinine, Ser 0.73; Hemoglobin 15.4; Platelets 247; Potassium 3.9; Sodium 137  Recent Lipid Panel    Component Value Date/Time   CHOL 328 (H) 03/24/2011 0831   TRIG 147.0 03/24/2011 0831   HDL 49.80 03/24/2011 0831   CHOLHDL 7 03/24/2011 0831   VLDL 29.4 03/24/2011 0831   LDLCALC 93 05/14/2010 0913   LDLDIRECT 230.3 03/24/2011 0831    Physical Exam:    Vital Signs: BP 122/76 (BP Location: Left Arm, Patient Position: Sitting, Cuff Size: Normal)   Pulse 63   Resp 20   Ht 5\' 7"  (1.702 m)   Wt 153 lb (69.4 kg)   SpO2 98%   BMI 23.96 kg/m     Wt Readings from Last 3 Encounters:  01/01/21 153 lb (69.4 kg)  12/19/20 150 lb 9.2 oz (68.3 kg)  11/11/19 153 lb (69.4 kg)     General: 60 y.o. male in no acute distress. HEENT: Normocephalic and atraumatic. Sclera clear.  Neck: Supple. No  JVD. Heart: RRR. Distinct S1 and S2. No murmurs, gallops, or rubs. Radial pulses 2+ and equal bilaterally. Lungs: No increased work of breathing. Clear to ausculation bilaterally. No wheezes, rhonchi, or rales.  Abdomen: Soft, non-distended, and non-tender to palpation.  MSK: Normal strength and tone for age.  Extremities: No lower extremity edema. Right arm in a sling. Skin: Warm and dry. Neuro: Alert and oriented x3. No focal deficits. Psych: Normal affect. Responds appropriately.  Assessment:    1. Chest pain, unspecified type   2. Coronary artery disease involving native coronary artery of native heart with angina pectoris ([place])   3. Hyperlipidemia, unspecified hyperlipidemia type     Plan:    Chest Pain CAD History of CAD with 75% stenosis of small to medium 1st Diag noted on cardiac cath in 10/2019. Medical therapy was recommend first reserving intervention for recalcitrant symptoms. His primary Cardiologist is Dr. Brigitte Pulse and he was referred back to [place] today for consideration of repeat cardiac catheterization with possible PCI given continued chest pain. - Patient continues to have exertional chest pain that is occurring more frequently. - EKG shows no acute ischemic changes. - Continue Imdur 10mg  daily. - Realized after visit that he is not on Aspirin. Will have patient start Aspirin 81mg  when we call with lab results. - Continue statin. - Patient is very eager to have repeat cardiac catheterization and possible PCI of Diagonal branch lesion. Discussed with Dr. Gwenlyn Found who is okay proceeding with this. Will check pre-procedural labs today (CBC and BMET).   The patient understands that risks include but are not limited to stroke (1 in 1000), death (1 in 30), kidney failure [usually temporary] (1 in 500), bleeding (1 in 200), allergic reaction [possibly serious] (1 in 200), and agrees to proceed.   Of note, patient recently had a right shoulder arthroscopy with subacromial  decompression, bicep tendon repair, and distal clavicle excision on 12/19/2020. He and is still in a sling. Therefore, may need to use left radial artery for access.  Hyperlipidemia No recent lipid panel in our system. - Continue Crestor 10mg  daily. - Will defer management to patient's primary Cardiologist (Dr. Brigitte Pulse).   Disposition: Follow up with primary Cardiologist (Dr. Brigitte Pulse) after cardica catheterization.   Medication Adjustments/Labs and Tests Ordered: Current medicines are reviewed at length with the patient today.  Concerns  regarding medicines are outlined above.  Orders Placed This Encounter  Procedures   Basic metabolic panel   CBC   EKG 12-Lead   No orders of the defined types were placed in this encounter.   Patient Instructions  Medication Instructions:  Your physician recommends that you continue on your current medications as directed. Please refer to the Current Medication list given to you today.  *If you need a refill on your cardiac medications before your next appointment, please call your pharmacy*   Lab Work: Your physician recommends that you return for lab work TODAY:  BMET CBC If you have labs (blood work) drawn today and your tests are completely normal, you will receive your results only by: [place] (if you have MyChart) OR A paper copy in the mail If you have any lab test that is abnormal or we need to change your treatment, we will call you to review the results.  Testing/Procedures: Your physician has requested that you have a cardiac catheterization. Cardiac catheterization is used to diagnose and/or treat various heart conditions. Doctors may recommend this procedure for a number of different reasons. The most common reason is to evaluate chest pain. Chest pain can be a symptom of coronary artery disease (CAD), and cardiac catheterization can show whether plaque is narrowing or blocking your heart's arteries. This procedure is also used to  evaluate the valves, as well as measure the blood flow and oxygen levels in different parts of your heart. For further information please visit [email or web address]. Please follow instruction sheet, as given.  Scheduled for Monday 01/07/21 at 11:30 AM   Follow-Up: At [facility], you and your health needs are our priority.  As part of our continuing mission to provide you with exceptional heart care, we have created designated Provider Care Teams.  These Care Teams include your primary Cardiologist (physician) and Advanced Practice Providers (APPs -  Physician Assistants and Nurse Practitioners) who all work together to provide you with the care you need, when you need it.  Your next appointment:   2 week(s) after Heart Cath   The format for your next appointment:   In Person  Provider:   Dr. Brigitte Pulse  {  Other Instructions  [facility] CARDIOVASCULAR DIVISION [facility] [address] [place] [address] Dept: [place]: [place]  01/01/2021  You are scheduled for a Cardiac Catheterization on Monday, December 5 with Dr. Quay Burow.  1. Please arrive at the [facility] (Main Entrance A) at [facility]: [address] [place], [place] [address] at 9:30 AM (This time is two hours before your procedure to ensure your preparation). Free valet parking service is available.   Special note: Every effort is made to have your procedure done on time. Please understand that emergencies sometimes delay scheduled procedures.  2. Diet: Do not eat solid foods after midnight.  The patient may have clear liquids until 5am upon the day of the procedure.  3. Labs: You will need to have blood drawn on Monday, November 29 at [place]  Open: 8am - 5pm (Lunch 12:30 - 1:30)   Phone: [phone]. You do not need to be fasting.  4. Medication instructions in preparation for  your procedure:   Contrast Allergy: No  On the morning of your procedure, take your Aspirin and any morning medicines NOT listed above.  You may use sips of water.  5.  Plan for one night stay--bring personal belongings. 6. Bring a current list of your medications and current insurance cards. 7. You MUST have a responsible person to drive you home. 8. Someone MUST be with you the first 24 hours after you arrive home or your discharge will be delayed. 9. Please wear clothes that are easy to get on and off and wear slip-on shoes.  Thank you for allowing [place] to care for you!   -- [facility] Invasive Cardiovascular services    Signed, Eppie Gibson  01/01/2021 10:44 AM    [place]

## 2020-12-28 NOTE — Progress Notes (Addendum)
Cardiology Office Note:    Date:  01/01/2021   ID:  Cristian Fuller, DOB [birth year], MRN [id number]  PCP:  Sharon Seller, MD  Cardiologist:  Dr. Brigitte Pulse at [facility] Electrophysiologist:  None   Referring MD: Astrid Divine, MD   Chief Complaint: chest pain  History of Present Illness:    Cristian Fuller is a 60 y.o. male with a history of chest pain with 75% stenosis of Diag branch noted on cardiac catheterization in 10/2019 (treated medically) and hyperlipidemia who is followed by Dr. Brigitte Pulse at [facility] and presents today for evaluation of chest pain.  Patient's primary Cardiologist is Dr. Brigitte Pulse. He was referred to Dr. Gwenlyn Found in 10/2019 for a diagnostic coronary angiography to define his anatomy after negative Myoview in 07/2019 and continued chest pain. Echo in 06/2019 showed LVEF of >75% with normal diastolic function and no significant valvular disease. Dr. Gwenlyn Found performed a cardiac catheterization on 10/24/2019 which showed 75% stenosis of a small to medium size 1st Diag but no other CAD. Medical therapy was recommend first reserving intervention for recalcitrant symptoms.   Patient was seen in the ED on 11/17/2020 for chest pain that improved with sublingual Nitro. Troponin negative at that time and EKG unremarkable. He was felt to be stable for discharge with close follow-up with Cardiology. He was seen by Dr. Brigitte Pulse on 11/24/2020 at which time he described pressure-like and squeezing chest pain that radiated down his left arm and occurred with emotional excitement and exertion. He also reported exertional mid epigastric pain to Dr. Brigitte Pulse that would relieve with rest. He was started on low dose Imdur 10mg  daily due to soft BP and was referred back to Dr. Gwenlyn Found for consideration of repeat cardiac catheterization with possible PCI.  Patient is here today alone. He reports continued substernal non-radiating chest pain that he describes as a "squeezing/choking" sensation. No  associated shortness of breath, nausea, or vomiting. Occurs when he is rushing or moving fast. He states this is the same pain that he has had for the last couple of years. It is not getting worse in severity but is occurring more frequently. He denies any significant improvement with Imdur. He is very interested in having a repeat cardiac catheterization and potentially fixing the Diagonal branch lesion. He denies any significant shortness of breath. No orthopnea, PND, or lower extremity edema. No palpitations, dizziness, or syncope. He does report occasional leg cramps at night as well as intermittent dull back pain all the way across his mid/upper back (from left to right) that occurs at rest. He states back pain as been going on for about 1 year. Advised following up with PCP.  Past Medical History:  Diagnosis Date   Allergic rhinitis    Hyperlipidemia    Left varicocele    history of left varicocele    Past Surgical History:  Procedure Laterality Date   BACK SURGERY     ESOPHAGOGASTRODUODENOSCOPY  2002/2007   LEFT HEART CATH AND CORONARY ANGIOGRAPHY N/A 10/24/2019   Procedure: LEFT HEART CATH AND CORONARY ANGIOGRAPHY;  Surgeon: Lorretta Harp, MD;  Location: [place] [facility];  Service: Cardiovascular;  Laterality: N/A;   SHOULDER ARTHROSCOPY WITH SUBACROMIAL DECOMPRESSION AND BICEP TENDON REPAIR Right 12/19/2020   Procedure: SHOULDER ARTHROSCOPY WITH SUBACROMIAL DECOMPRESSION AND BICEP TENDON REPAIR AND DISTAL CLAVICLE EXCISION;  Surgeon: Hiram Gash, MD;  Location: [place];  Service: Orthopedics;  Laterality: Right;    Current Medications: Current  Meds  Medication Sig   celecoxib (CELEBREX) 100 MG capsule Take 1 capsule (100 mg total) by mouth 2 (two) times daily. For 2 weeks. Then take as needed   famotidine (PEPCID) 20 MG tablet Take 1 tablet (20 mg total) by mouth 2 (two) times daily. Takes 30 minutes before breakfast and dinner   Isosorbide Mononitrate  (IMDUR PO) Take 10 mg by mouth daily.   nitroGLYCERIN (NITROSTAT) 0.4 MG SL tablet Place 1 tablet (0.4 mg total) under the tongue once as needed for up to 1 dose for chest pain.   rosuvastatin (CRESTOR) 5 MG tablet Take 5 mg by mouth daily. TAKING 10 MG DAILY   Vitamin D, Ergocalciferol, (DRISDOL) 1.25 MG (50000 UNIT) CAPS capsule Take 50,000 Units by mouth once a week.     Allergies:   Patient has no known allergies.   Social History   Socioeconomic History   Marital status: Married    Spouse name: Not on file   Number of children: Not on file   Years of education: Not on file   Highest education level: Not on file  Occupational History   Not on file  Tobacco Use   Smoking status: Never   Smokeless tobacco: Never  Substance and Sexual Activity   Alcohol use: No   Drug use: No   Sexual activity: Not on file  Other Topics Concern   Not on file  Social History Narrative   Occupation: Self employed   Married   Never Smoked    Alcohol use-no     Caffeine use/day:  2-3 times per week   Does Patient Exercise:  no   Social Determinants of Radio broadcast assistant Strain: Not on file  Food Insecurity: Not on file  Transportation Needs: Not on file  Physical Activity: Not on file  Stress: Not on file  Social Connections: Not on file     Family History: The patient's family history includes Cancer in his unknown relative; Colon cancer in his paternal aunt; Pancreatic cancer in his unknown relative; Rectal cancer in his unknown relative; Stomach cancer in his paternal uncle and unknown relative; Stroke in his unknown relative.  ROS:   Please see the history of present illness.     EKGs/Labs/Other Studies Reviewed:    The following studies were reviewed today:  Myoview 07/18/2019: There was no ST segment deviation noted during stress. The study is normal. This is a low risk study. Nuclear stress EF: 65%. The left ventricular ejection fraction is normal (55-65%).    1. This is a normal study without ischemia or infarction.  2. Normal LVEF, 65%.  3. This is a low-risk study.  _______________  Cardiac Catheterization 10/24/2019: 1st Diag lesion is 75% stenosed.  Impression: Mr. 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 He has no other CAD and normal LV function.  I believe initial strategy of antianginal therapy reserving intervention for recalcitrant symptoms. I discussed this with Dr. Brigitte Pulse, the varying cardiologist, who agrees with this plan.  The sheath was removed and a TR band was placed on the right wrist to achieve patent hemostasis.  The patient left lab in stable condition.  He will be discharged home early this afternoon as an outpatient and will follow up with Dr. Manuella Ghazi who will initiate antianginal medications.  Diagnostic Dominance: Right    EKG:  EKG ordered today. EKG personally reviewed and demonstrates normal sinus rhythm, rate 63  bpm, with 1st degree AV block, incomplete RBBB, and underlying artifact but no acute ST/T changes. Normal axis. QTc 399 ms.   Recent Labs: 11/17/2020: BUN 14; Creatinine, Ser 0.73; Hemoglobin 15.4; Platelets 247; Potassium 3.9; Sodium 137  Recent Lipid Panel    Component Value Date/Time   CHOL 328 (H) 03/24/2011 0831   TRIG 147.0 03/24/2011 0831   HDL 49.80 03/24/2011 0831   CHOLHDL 7 03/24/2011 0831   VLDL 29.4 03/24/2011 0831   LDLCALC 93 05/14/2010 0913   LDLDIRECT 230.3 03/24/2011 0831    Physical Exam:    Vital Signs: BP 122/76 (BP Location: Left Arm, Patient Position: Sitting, Cuff Size: Normal)   Pulse 63   Resp 20   Ht 5\' 7"  (1.702 m)   Wt 153 lb (69.4 kg)   SpO2 98%   BMI 23.96 kg/m     Wt Readings from Last 3 Encounters:  01/01/21 153 lb (69.4 kg)  12/19/20 150 lb 9.2 oz (68.3 kg)  11/11/19 153 lb (69.4 kg)     General: 60 y.o. male in no acute distress. HEENT: Normocephalic and atraumatic. Sclera clear.  Neck: Supple. No  JVD. Heart: RRR. Distinct S1 and S2. No murmurs, gallops, or rubs. Radial pulses 2+ and equal bilaterally. Lungs: No increased work of breathing. Clear to ausculation bilaterally. No wheezes, rhonchi, or rales.  Abdomen: Soft, non-distended, and non-tender to palpation.  MSK: Normal strength and tone for age.  Extremities: No lower extremity edema. Right arm in a sling. Skin: Warm and dry. Neuro: Alert and oriented x3. No focal deficits. Psych: Normal affect. Responds appropriately.  Assessment:    1. Chest pain, unspecified type   2. Coronary artery disease involving native coronary artery of native heart with angina pectoris ([place])   3. Hyperlipidemia, unspecified hyperlipidemia type     Plan:    Chest Pain CAD History of CAD with 75% stenosis of small to medium 1st Diag noted on cardiac cath in 10/2019. Medical therapy was recommend first reserving intervention for recalcitrant symptoms. His primary Cardiologist is Dr. Brigitte Pulse and he was referred back to [place] today for consideration of repeat cardiac catheterization with possible PCI given continued chest pain. - Patient continues to have exertional chest pain that is occurring more frequently. - EKG shows no acute ischemic changes. - Continue Imdur 10mg  daily. - Realized after visit that he is not on Aspirin. Will have patient start Aspirin 81mg  when we call with lab results. - Continue statin. - Patient is very eager to have repeat cardiac catheterization and possible PCI of Diagonal branch lesion. Discussed with Dr. Gwenlyn Found who is okay proceeding with this. Will check pre-procedural labs today (CBC and BMET).   The patient understands that risks include but are not limited to stroke (1 in 1000), death (1 in 44), kidney failure [usually temporary] (1 in 500), bleeding (1 in 200), allergic reaction [possibly serious] (1 in 200), and agrees to proceed.   Of note, patient recently had a right shoulder arthroscopy with subacromial  decompression, bicep tendon repair, and distal clavicle excision on 12/19/2020. He and is still in a sling. Therefore, may need to use left radial artery for access.  Hyperlipidemia No recent lipid panel in our system. - Continue Crestor 10mg  daily. - Will defer management to patient's primary Cardiologist (Dr. Brigitte Pulse).   Disposition: Follow up with primary Cardiologist (Dr. Brigitte Pulse) after cardica catheterization.   Medication Adjustments/Labs and Tests Ordered: Current medicines are reviewed at length with the patient today.  Concerns  regarding medicines are outlined above.  Orders Placed This Encounter  Procedures   Basic metabolic panel   CBC   EKG 12-Lead   No orders of the defined types were placed in this encounter.   Patient Instructions  Medication Instructions:  Your physician recommends that you continue on your current medications as directed. Please refer to the Current Medication list given to you today.  *If you need a refill on your cardiac medications before your next appointment, please call your pharmacy*   Lab Work: Your physician recommends that you return for lab work TODAY:  BMET CBC If you have labs (blood work) drawn today and your tests are completely normal, you will receive your results only by: [place] (if you have MyChart) OR A paper copy in the mail If you have any lab test that is abnormal or we need to change your treatment, we will call you to review the results.  Testing/Procedures: Your physician has requested that you have a cardiac catheterization. Cardiac catheterization is used to diagnose and/or treat various heart conditions. Doctors may recommend this procedure for a number of different reasons. The most common reason is to evaluate chest pain. Chest pain can be a symptom of coronary artery disease (CAD), and cardiac catheterization can show whether plaque is narrowing or blocking your heart's arteries. This procedure is also used to  evaluate the valves, as well as measure the blood flow and oxygen levels in different parts of your heart. For further information please visit HugeFiesta.[email or web address]. Please follow instruction sheet, as given.  Scheduled for Monday 01/07/21 at 11:30 AM   Follow-Up: At [facility], you and your health needs are our priority.  As part of our continuing mission to provide you with exceptional heart care, we have created designated Provider Care Teams.  These Care Teams include your primary Cardiologist (physician) and Advanced Practice Providers (APPs -  Physician Assistants and Nurse Practitioners) who all work together to provide you with the care you need, when you need it.  Your next appointment:   2 week(s) after Heart Cath   The format for your next appointment:   In Person  Provider:   Dr. Brigitte Pulse  {  Other Instructions  [facility] CARDIOVASCULAR DIVISION [facility] [address] [place] [address] Dept: [place]: [place]  01/01/2021  You are scheduled for a Cardiac Catheterization on Monday, December 5 with Dr. Quay Burow.  1. Please arrive at the [facility] (Main Entrance A) at [facility]: [address] [place], [place] [address] at 9:30 AM (This time is two hours before your procedure to ensure your preparation). Free valet parking service is available.   Special note: Every effort is made to have your procedure done on time. Please understand that emergencies sometimes delay scheduled procedures.  2. Diet: Do not eat solid foods after midnight.  The patient may have clear liquids until 5am upon the day of the procedure.  3. Labs: You will need to have blood drawn on Monday, November 29 at [place]  Open: 8am - 5pm (Lunch 12:30 - 1:30)   Phone: [phone]. You do not need to be fasting.  4. Medication instructions in preparation for  your procedure:   Contrast Allergy: No  On the morning of your procedure, take your Aspirin and any morning medicines NOT listed above.  You may use sips of water.  5.  Plan for one night stay--bring personal belongings. 6. Bring a current list of your medications and current insurance cards. 7. You MUST have a responsible person to drive you home. 8. Someone MUST be with you the first 24 hours after you arrive home or your discharge will be delayed. 9. Please wear clothes that are easy to get on and off and wear slip-on shoes.  Thank you for allowing [place] to care for you!   -- [facility] Invasive Cardiovascular services    Signed, Eppie Gibson  01/01/2021 10:44 AM    [place]

## 2020-12-31 ENCOUNTER — Ambulatory Visit: Payer: Medicaid Other | Admitting: Sports Medicine

## 2020-12-31 ENCOUNTER — Other Ambulatory Visit: Payer: Self-pay

## 2020-12-31 DIAGNOSIS — G8929 Other chronic pain: Secondary | ICD-10-CM | POA: Diagnosis not present

## 2020-12-31 DIAGNOSIS — M25511 Pain in right shoulder: Secondary | ICD-10-CM | POA: Diagnosis not present

## 2020-12-31 DIAGNOSIS — G5603 Carpal tunnel syndrome, bilateral upper limbs: Secondary | ICD-10-CM | POA: Diagnosis not present

## 2020-12-31 NOTE — Progress Notes (Signed)
    Procedures performed today:    None.  Independent interpretation of notes and tests performed by another provider:   None.  Brief History, Exam, Impression, and Recommendations:    Chronic right shoulder pain This pleasant 60 year old male has chronic right shoulder pain, MRI showed multiple pathologic findings, he was referred to Dr. Griffin Basil and has had labral debridement, repair, bicipital tenodesis, he is overall doing well, he can follow-up with Dr. Griffin Basil, he did have some questions about bruising which appears to be normal postoperative bruising, return to see me as needed for this.  Carpal tunnel syndrome on both sides Bilateral EMG confirmed carpal tunnel syndrome, we did bilateral median nerve Hydro dissections at the last visit, he returns today symptom-free. Return as needed, we can do this up to 4 times a year.    ___________________________________________ Cristian Fuller, M.D., ABFM., CAQSM. Primary Care and [place] Instructor of [place] [employer] [facility] of Medicine

## 2020-12-31 NOTE — Assessment & Plan Note (Signed)
Bilateral EMG confirmed carpal tunnel syndrome, we did bilateral median nerve Hydro dissections at the last visit, he returns today symptom-free. Return as needed, we can do this up to 4 times a year.

## 2020-12-31 NOTE — Assessment & Plan Note (Signed)
This pleasant 60 year old male has chronic right shoulder pain, MRI showed multiple pathologic findings, he was referred to Dr. Griffin Basil and has had labral debridement, repair, bicipital tenodesis, he is overall doing well, he can follow-up with Dr. Griffin Basil, he did have some questions about bruising which appears to be normal postoperative bruising, return to see me as needed for this.

## 2021-01-01 ENCOUNTER — Encounter: Payer: Self-pay | Admitting: Student

## 2021-01-01 ENCOUNTER — Ambulatory Visit (INDEPENDENT_AMBULATORY_CARE_PROVIDER_SITE_OTHER): Payer: Medicaid Other | Admitting: Student

## 2021-01-01 ENCOUNTER — Ambulatory Visit: Payer: Medicaid Other | Admitting: Physical Therapy

## 2021-01-01 VITALS — BP 122/76 | HR 63 | Resp 20 | Ht 67.0 in | Wt 153.0 lb

## 2021-01-01 DIAGNOSIS — E785 Hyperlipidemia, unspecified: Secondary | ICD-10-CM

## 2021-01-01 DIAGNOSIS — I25119 Atherosclerotic heart disease of native coronary artery with unspecified angina pectoris: Secondary | ICD-10-CM | POA: Diagnosis not present

## 2021-01-01 DIAGNOSIS — M25611 Stiffness of right shoulder, not elsewhere classified: Secondary | ICD-10-CM

## 2021-01-01 DIAGNOSIS — R079 Chest pain, unspecified: Secondary | ICD-10-CM | POA: Diagnosis not present

## 2021-01-01 DIAGNOSIS — M25511 Pain in right shoulder: Secondary | ICD-10-CM | POA: Diagnosis not present

## 2021-01-01 DIAGNOSIS — M6281 Muscle weakness (generalized): Secondary | ICD-10-CM

## 2021-01-01 NOTE — Therapy (Signed)
[place] [address]  [place], [place], [address] Phone: [phone]   Fax:  [phone]  Physical Therapy Treatment  Patient Details  Name: Cristian Fuller MRN: [id number] Date of Birth: [birth year] Referring Provider (PT): Ophelia Charter   Encounter Date: 01/01/2021   PT End of Session - 01/01/21 1259     Visit Number 2    Number of Visits 24    Date for PT Re-Evaluation 02/06/21    Authorization Type [employer] Medicaid    PT Start Time 1259    PT Stop Time 7169    PT Time Calculation (min) 46 min    Equipment Utilized During Treatment Other (comment)   shoulder immobilizer   Activity Tolerance Patient tolerated treatment well    Behavior During Therapy [facility] for tasks assessed/performed             Past Medical History:  Diagnosis Date   Allergic rhinitis    Hyperlipidemia    Left varicocele    history of left varicocele    Past Surgical History:  Procedure Laterality Date   BACK SURGERY     ESOPHAGOGASTRODUODENOSCOPY  2002/2007   LEFT HEART CATH AND CORONARY ANGIOGRAPHY N/A 10/24/2019   Procedure: LEFT HEART CATH AND CORONARY ANGIOGRAPHY;  Surgeon: Lorretta Harp, MD;  Location: [place] [facility];  Service: Cardiovascular;  Laterality: N/A;   SHOULDER ARTHROSCOPY WITH SUBACROMIAL DECOMPRESSION AND BICEP TENDON REPAIR Right 12/19/2020   Procedure: SHOULDER ARTHROSCOPY WITH SUBACROMIAL DECOMPRESSION AND BICEP TENDON REPAIR AND DISTAL CLAVICLE EXCISION;  Surgeon: Hiram Gash, MD;  Location: [place];  Service: Orthopedics;  Laterality: Right;    There were no vitals filed for this visit.   Subjective Assessment - 01/01/21 1301     Subjective Pt reports nothing new to report. Pt reports he's been doing his exercises.    Currently in Pain? Yes    Pain Score 4     Pain Location Shoulder    Pain Orientation Right    Pain Descriptors / Indicators Aching    Pain Type Surgical  pain                [facility] PT Assessment - 01/01/21 0001       Assessment   Medical Diagnosis M75.41 (ICD-10-CM) - Impingement syndrome of right shoulder  S42.031A (ICD-10-CM) - Displaced fracture of lateral end of right clavicle  M75.21 (ICD-10-CM) - Biceps tendonitis on right    Referring Provider (PT) Griffin Basil, [place]    Onset Date/Surgical Date 12/19/20    Hand Dominance Right      Precautions   Type of Shoulder Precautions Distal clavicle resection: Avoid cross-body adduction x 6 wks; no resisted elbow motions for 4 weeks, sling for 4 weeks    Precaution Comments No acromioplasty noted in op note; initiated gentle shoulder abd    Required Braces or Orthoses Sling      Restrictions   Weight Bearing Restrictions Yes    RUE Weight Bearing Non weight bearing                           [facility] Adult PT Treatment/Exercise - 01/01/21 0001       Exercises   Exercises Elbow      Elbow Exercises   Elbow Flexion AAROM      Shoulder Exercises: Supine   External Rotation AAROM;10 reps    External Rotation Limitations with  cane    Flexion AAROM;10 reps    Flexion Limitations with cane    ABduction AAROM;10 reps    ABduction Limitations with cane      Shoulder Exercises: Seated   Retraction Strengthening;10 reps    Retraction Limitations 5 sec    Other Seated Exercises scapular depression 10x5 sec      Manual Therapy   Manual Therapy Passive ROM;Taping    Manual therapy comments TPR along pecs, lats and subscapularis to insertion at humerus    Passive ROM Shoulder flexion, scaption, ER, IR PROM; elbow flex/ext    Kinesiotex Edema      Kinesiotix   Edema squid shaped strip along medial arm to decrease bruising                     PT Education - 01/01/21 2146     Education Details Discussed phase 1 protocol and updated HEP.    Person(s) Educated Patient    Methods Explanation;Demonstration;Handout;Verbal cues    Comprehension Verbalized  understanding;Returned demonstration;Verbal cues required              PT Short Term Goals - 12/26/20 1049       PT SHORT TERM GOAL #1   Title Patient to be independent with initial HEP.    Time 2    Period Weeks    Status New    Target Date 01/09/21      PT SHORT TERM GOAL #2   Title Patient will demonstrate improved R shoulder ROM to 140 deg foward flexion without pain.    Time 8    Period Weeks    Status New    Target Date 02/20/21               PT Long Term Goals - 12/26/20 1049       PT LONG TERM GOAL #1   Title Patient to be independent with advanced HEP to progress to gym based exercise program.    Time 12    Period Weeks    Status New    Target Date 03/20/21      PT LONG TERM GOAL #2   Title Patient to demonstrate R UE strength >/=4+/5  without pain to be able to start gentle resistance training    Time 8    Period Weeks    Status New    Target Date 02/20/21      PT LONG TERM GOAL #3   Title Patient to demonstrate R shoulder AROM WFL and without pain limiting.    Time 12    Period Weeks    Status New    Target Date 03/20/21      PT LONG TERM GOAL #4   Title Patient will report >48 on FOTO to demonstrate improved function of R shoulder    Baseline 4    Time 12    Period Weeks    Status New    Target Date 03/20/21                   Plan - 01/01/21 2147     Clinical Impression Statement Treatment session focused primarily on PROM and initiating AAROM. Pt able to tolerate exercises well. Tightness/trigger points noted along R pecs and near insertion of lats/subscapularis. Reinforced performing AAROM in pain free ROM. Trialed k-tape to decrease bruising noted along pt's medial arm.    Personal Factors and Comorbidities Comorbidity 2;Social Background    Comorbidities history chronic L  shoulder pain, history of heart disease    Examination-Activity Limitations Bathing;Lift;Carry;Reach Overhead;Dressing    Examination-Participation  Restrictions Cleaning;Laundry;Occupation;Driving;Yard Work;Meal Prep    Stability/Clinical Decision Making Stable/Uncomplicated    Rehab Potential Excellent    PT Frequency 2x / week    PT Duration 12 weeks    PT Treatment/Interventions ADLs/Self Care Home Management;Cryotherapy;Electrical Stimulation;Moist Heat;Therapeutic activities;Therapeutic exercise;Neuromuscular re-education;Patient/family education;Scar mobilization;Passive range of motion;Dry needling;Taping;Vasopneumatic Device;Joint Manipulations    PT Next Visit Plan review and progress HEP per surgical protocol.  Surgery date 12/19/20, phase 1 to 01/16/21.    PT Home Exercise Plan Access Code: [email or web address]    Consulted and Agree with Plan of Care Patient             Patient will benefit from skilled therapeutic intervention in order to improve the following deficits and impairments:  Decreased endurance, Decreased skin integrity, Increased muscle spasms, Decreased range of motion, Decreased scar mobility, Decreased activity tolerance, Decreased strength, Increased fascial restricitons, Impaired flexibility, Impaired UE functional use, Pain  Visit Diagnosis: Acute pain of right shoulder  Stiffness of right shoulder, not elsewhere classified  Muscle weakness (generalized)     Problem List Patient Active Problem List   Diagnosis Date Noted   Chronic right shoulder pain 07/17/2020   Left tennis elbow 07/17/2020   Carpal tunnel syndrome on both sides 07/17/2020   Atypical chest pain 10/18/2019   Hemoptysis 10/18/2012   Preventative health care 04/07/2011   NUMBNESS, HAND 12/27/2007   HYPERLIPIDEMIA 12/24/2006   ALLERGIC RHINITIS 12/24/2006   GERD 12/24/2006   TB SKIN TEST, POSITIVE 12/24/2006    Leilany Digeronimo April Gordy Levan, PT, DPT 01/01/2021, 9:56 PM  [facility] [address]  [place], [place], [address] Phone: [phone]   Fax:  [phone]  Name:  Cristian Fuller MRN: [id number] Date of Birth: 03-Oct-1960

## 2021-01-01 NOTE — Patient Instructions (Signed)
Medication Instructions:  Your physician recommends that you continue on your current medications as directed. Please refer to the Current Medication list given to you today.  *If you need a refill on your cardiac medications before your next appointment, please call your pharmacy*   Lab Work: Your physician recommends that you return for lab work TODAY:  BMET CBC If you have labs (blood work) drawn today and your tests are completely normal, you will receive your results only by: [place] (if you have MyChart) OR A paper copy in the mail If you have any lab test that is abnormal or we need to change your treatment, we will call you to review the results.  Testing/Procedures: Your physician has requested that you have a cardiac catheterization. Cardiac catheterization is used to diagnose and/or treat various heart conditions. Doctors may recommend this procedure for a number of different reasons. The most common reason is to evaluate chest pain. Chest pain can be a symptom of coronary artery disease (CAD), and cardiac catheterization can show whether plaque is narrowing or blocking your heart's arteries. This procedure is also used to evaluate the valves, as well as measure the blood flow and oxygen levels in different parts of your heart. For further information please visit [email or web address]. Please follow instruction sheet, as given.  Scheduled for Monday 01/07/21 at 11:30 AM   Follow-Up: At [facility], you and your health needs are our priority.  As part of our continuing mission to provide you with exceptional heart care, we have created designated Provider Care Teams.  These Care Teams include your primary Cardiologist (physician) and Advanced Practice Providers (APPs -  Physician Assistants and Nurse Practitioners) who all work together to provide you with the care you need, when you need it.  Your next appointment:   2 week(s) after Heart Cath   The format for your next  appointment:   In Person  Provider:   Dr. Brigitte Pulse  {  Other Instructions  [facility] CARDIOVASCULAR DIVISION [facility] [address] [address] Dept: [place]: Victoria Vera  01/01/2021  You are scheduled for a Cardiac Catheterization on Monday, December 5 with Dr. Quay Burow.  1. Please arrive at the [facility] (Main Entrance A) at [facility]: [address] [place], [place] [address] at 9:30 AM (This time is two hours before your procedure to ensure your preparation). Free valet parking service is available.   Special note: Every effort is made to have your procedure done on time. Please understand that emergencies sometimes delay scheduled procedures.  2. Diet: Do not eat solid foods after midnight.  The patient may have clear liquids until 5am upon the day of the procedure.  3. Labs: You will need to have blood drawn on Monday, November 29 at [place]  Open: 8am - 5pm (Lunch 12:30 - 1:30)   Phone: [phone]. You do not need to be fasting.  4. Medication instructions in preparation for your procedure:   Contrast Allergy: No  On the morning of your procedure, take your Aspirin and any morning medicines NOT listed above.  You may use sips of water.  5. Plan for one night stay--bring personal belongings. 6. Bring a current list of your medications and current insurance cards. 7. You MUST have a responsible person to drive you home. 8. Someone MUST be with you the first 24 hours after you arrive  home or your discharge will be delayed. 9. Please wear clothes that are easy to get on and off and wear slip-on shoes.  Thank you for allowing [place] to care for you!   -- [facility]

## 2021-01-02 LAB — CBC
Hematocrit: 42.8 % (ref 37.5–51.0)
Hemoglobin: 14.5 g/dL (ref 13.0–17.7)
MCH: 31.4 pg (ref 26.6–33.0)
MCHC: 33.9 g/dL (ref 31.5–35.7)
MCV: 93 fL (ref 79–97)
Platelets: 225 10*3/uL (ref 150–450)
RBC: 4.62 x10E6/uL (ref 4.14–5.80)
RDW: 13.1 % (ref 11.6–15.4)
WBC: 5.5 10*3/uL (ref 3.4–10.8)

## 2021-01-02 LAB — BASIC METABOLIC PANEL
BUN/Creatinine Ratio: 12 (ref 10–24)
BUN: 12 mg/dL (ref 8–27)
CO2: 26 mmol/L (ref 20–29)
Calcium: 9.3 mg/dL (ref 8.6–10.2)
Chloride: 103 mmol/L (ref 96–106)
Creatinine, Ser: 1.03 mg/dL (ref 0.76–1.27)
Glucose: 71 mg/dL (ref 70–99)
Potassium: 4.2 mmol/L (ref 3.5–5.2)
Sodium: 144 mmol/L (ref 134–144)
eGFR: 83 mL/min/{1.73_m2} (ref >59)

## 2021-01-03 ENCOUNTER — Ambulatory Visit: Payer: Medicaid Other | Attending: Orthopaedic Surgery

## 2021-01-03 ENCOUNTER — Other Ambulatory Visit: Payer: Self-pay

## 2021-01-03 ENCOUNTER — Telehealth: Payer: Self-pay | Admitting: *Deleted

## 2021-01-03 DIAGNOSIS — M6281 Muscle weakness (generalized): Secondary | ICD-10-CM | POA: Insufficient documentation

## 2021-01-03 DIAGNOSIS — M25522 Pain in left elbow: Secondary | ICD-10-CM | POA: Diagnosis present

## 2021-01-03 DIAGNOSIS — R293 Abnormal posture: Secondary | ICD-10-CM | POA: Insufficient documentation

## 2021-01-03 DIAGNOSIS — M25511 Pain in right shoulder: Secondary | ICD-10-CM | POA: Diagnosis not present

## 2021-01-03 DIAGNOSIS — M25611 Stiffness of right shoulder, not elsewhere classified: Secondary | ICD-10-CM | POA: Insufficient documentation

## 2021-01-03 NOTE — Telephone Encounter (Signed)
Cardiac catheterization scheduled at [facility] for: Monday January 07, 2021 11:30 AM [place] Entrance A [facility]) at: 9:30 AM   Diet-no solid food after midnight prior to cath, clear liquids until 5 AM day of procedure.  Medication instructions for procedure: -Usual morning medications can be taken pre-cath with sips of water including aspirin 81 mg.    Confirmed patient has responsible adult to drive home post procedure and be with patient first 24 hours after arriving home.  [facility] does allow one visitor to accompany you and wait in the hospital waiting room while you are there for your procedure. You and your visitor will be asked to wear a mask once you enter the hospital.   Patient reports does not currently have any new symptoms concerning for COVID-19 and no household members with COVID-19 like illness.    Reviewed procedure/mask/visitor instructions with patient.

## 2021-01-03 NOTE — Therapy (Signed)
[facility] [address]  [place], [place], [address] Phone: [phone]   Fax:  [phone]  Physical Therapy Treatment  Patient Details  Name: Cristian Fuller MRN: [id number] Date of Birth: [birth year] Referring Provider (PT): Ophelia Charter   Encounter Date: 01/03/2021   PT End of Session - 01/03/21 1559     Visit Number 3    Number of Visits 24    Date for PT Re-Evaluation 02/06/21    Authorization Type [employer] Medicaid    PT Start Time 1415   pt late   PT Stop Time 6962    PT Time Calculation (min) 43 min    Equipment Utilized During Treatment Other (comment)   shoulder immobilizer   Activity Tolerance Patient tolerated treatment well    Behavior During Therapy [facility] for tasks assessed/performed             Past Medical History:  Diagnosis Date   Allergic rhinitis    Hyperlipidemia    Left varicocele    history of left varicocele    Past Surgical History:  Procedure Laterality Date   BACK SURGERY     ESOPHAGOGASTRODUODENOSCOPY  2002/2007   LEFT HEART CATH AND CORONARY ANGIOGRAPHY N/A 10/24/2019   Procedure: LEFT HEART CATH AND CORONARY ANGIOGRAPHY;  Surgeon: Lorretta Harp, MD;  Location: [place] [facility];  Service: Cardiovascular;  Laterality: N/A;   SHOULDER ARTHROSCOPY WITH SUBACROMIAL DECOMPRESSION AND BICEP TENDON REPAIR Right 12/19/2020   Procedure: SHOULDER ARTHROSCOPY WITH SUBACROMIAL DECOMPRESSION AND BICEP TENDON REPAIR AND DISTAL CLAVICLE EXCISION;  Surgeon: Hiram Gash, MD;  Location: [place];  Service: Orthopedics;  Laterality: Right;    There were no vitals filed for this visit.   Subjective Assessment - 01/03/21 1417     Subjective Pt reports that he is having minimal pain as of now.    Currently in Pain? Yes    Pain Score 3     Pain Location Shoulder    Pain Orientation Right    Pain Descriptors / Indicators Aching    Pain Type Acute pain;Surgical pain                                [facility] Adult PT Treatment/Exercise - 01/03/21 0001       Shoulder Exercises: Supine   Protraction AAROM;Both;10 reps    Protraction Limitations 3 sec hold with cane    External Rotation AAROM;20 reps    External Rotation Limitations with cane    Flexion AAROM;20 reps    Flexion Limitations with cane    ABduction AAROM;10 reps    ABduction Limitations with cane      Shoulder Exercises: Seated   Retraction Strengthening;10 reps    Retraction Limitations 5 sec      Modalities   Modalities Vasopneumatic      Vasopneumatic   Number Minutes Vasopneumatic  10 minutes    Vasopnuematic Location  Shoulder    Vasopneumatic Pressure Low    Vasopneumatic Temperature  34      Manual Therapy   Manual Therapy Passive ROM    Passive ROM Shoulder flexion, scaption, ER, IR PROM; elbow flex/ext                       PT Short Term Goals - 01/03/21 1603       PT SHORT TERM GOAL #1  Title Patient to be independent with initial HEP.    Time 2    Period Weeks    Status On-going    Target Date 01/09/21      PT SHORT TERM GOAL #2   Title Patient will demonstrate improved R shoulder ROM to 140 deg foward flexion without pain.    Time 8    Period Weeks    Status On-going    Target Date 02/20/21               PT Long Term Goals - 01/03/21 1603       PT LONG TERM GOAL #1   Title Patient to be independent with advanced HEP to progress to gym based exercise program.    Time 12    Period Weeks    Status On-going      PT LONG TERM GOAL #2   Title Patient to demonstrate R UE strength >/=4+/5  without pain to be able to start gentle resistance training    Time 8    Period Weeks    Status On-going      PT LONG TERM GOAL #3   Title Patient to demonstrate R shoulder AROM WFL and without pain limiting.    Time 12    Period Weeks    Status On-going      PT LONG TERM GOAL #4   Title Patient will report >48 on FOTO to  demonstrate improved function of R shoulder    Baseline 4    Time 12    Period Weeks    Status On-going                   Plan - 01/03/21 1601     Clinical Impression Statement Pt well progressed through the exercises today. Continued with wand exercises to pt tolerance and per MD protocol. Educated him on using ice throughout the day, especially after exercise to reduce swelling and soreness. Reminded him on post surgical precautions and keeping AAROM in pain free ROM. Ended session with GR to address swelling in the shoulder post session.    Personal Factors and Comorbidities Comorbidity 2;Social Background    Comorbidities history chronic L shoulder pain, history of heart disease    Examination-Activity Limitations Bathing;Lift;Carry;Reach Overhead;Dressing    Examination-Participation Restrictions Cleaning;Laundry;Occupation;Driving;Yard Work;Meal Prep    Stability/Clinical Decision Making Stable/Uncomplicated    Rehab Potential Excellent    PT Frequency 2x / week    PT Duration 12 weeks    PT Treatment/Interventions ADLs/Self Care Home Management;Cryotherapy;Electrical Stimulation;Moist Heat;Therapeutic activities;Therapeutic exercise;Neuromuscular re-education;Patient/family education;Scar mobilization;Passive range of motion;Dry needling;Taping;Vasopneumatic Device;Joint Manipulations    PT Next Visit Plan review and progress HEP per surgical protocol.  Surgery date 12/19/20, phase 1 to 01/16/21.    PT Home Exercise Plan Access Code: [email or web address]    Consulted and Agree with Plan of Care Patient             Patient will benefit from skilled therapeutic intervention in order to improve the following deficits and impairments:  Decreased endurance, Decreased skin integrity, Increased muscle spasms, Decreased range of motion, Decreased scar mobility, Decreased activity tolerance, Decreased strength, Increased fascial restricitons, Impaired flexibility, Impaired UE functional use,  Pain  Visit Diagnosis: Acute pain of right shoulder  Stiffness of right shoulder, not elsewhere classified  Muscle weakness (generalized)  Pain in left elbow  Abnormal posture     Problem List Patient Active Problem List   Diagnosis Date Noted   Chronic right shoulder  pain 07/17/2020   Left tennis elbow 07/17/2020   Carpal tunnel syndrome on both sides 07/17/2020   Atypical chest pain 10/18/2019   Hemoptysis 10/18/2012   Preventative health care 04/07/2011   NUMBNESS, HAND 12/27/2007   HYPERLIPIDEMIA 12/24/2006   ALLERGIC RHINITIS 12/24/2006   GERD 12/24/2006   TB SKIN TEST, POSITIVE 12/24/2006    Artist Pais, PTA 01/03/2021, 4:05 PM  [facility] [address]  [place], [place], [address] Phone: [phone]   Fax:  [phone]  Name: Cristian Fuller MRN: [id number] Date of Birth: [birth year]

## 2021-01-07 ENCOUNTER — Encounter (HOSPITAL_COMMUNITY): Payer: Self-pay | Admitting: Cardiovascular Disease

## 2021-01-07 ENCOUNTER — Ambulatory Visit (HOSPITAL_COMMUNITY)
Admission: RE | Admit: 2021-01-07 | Discharge: 2021-01-07 | Disposition: A | Discharge status: Home / Self Care | Payer: Medicaid Other | Attending: Cardiovascular Disease | Admitting: Cardiovascular Disease

## 2021-01-07 ENCOUNTER — Other Ambulatory Visit: Payer: Self-pay

## 2021-01-07 ENCOUNTER — Encounter (HOSPITAL_COMMUNITY)
Admission: RE | Disposition: A | Discharge status: Home / Self Care | Payer: Self-pay | Source: Home / Self Care | Attending: Cardiovascular Disease

## 2021-01-07 DIAGNOSIS — E785 Hyperlipidemia, unspecified: Secondary | ICD-10-CM | POA: Diagnosis not present

## 2021-01-07 DIAGNOSIS — I25119 Atherosclerotic heart disease of native coronary artery with unspecified angina pectoris: Secondary | ICD-10-CM | POA: Diagnosis present

## 2021-01-07 DIAGNOSIS — I25118 Atherosclerotic heart disease of native coronary artery with other forms of angina pectoris: Secondary | ICD-10-CM

## 2021-01-07 DIAGNOSIS — I2510 Atherosclerotic heart disease of native coronary artery without angina pectoris: Secondary | ICD-10-CM

## 2021-01-07 HISTORY — PX: LEFT HEART CATH AND CORONARY ANGIOGRAPHY: CATH118249

## 2021-01-07 SURGERY — LEFT HEART CATH AND CORONARY ANGIOGRAPHY
Anesthesia: LOCAL

## 2021-01-07 MED ORDER — HEPARIN (PORCINE) IN NACL 1000-0.9 UT/500ML-% IV SOLN
INTRAVENOUS | Status: AC
  Filled 2021-01-07: qty 500

## 2021-01-07 MED ORDER — SODIUM CHLORIDE 0.9 % IV SOLN: 250.0000 mL | INTRAVENOUS | Status: DC | PRN

## 2021-01-07 MED ORDER — HEPARIN SODIUM (PORCINE) 1000 UNIT/ML IJ SOLN
INTRAMUSCULAR | Status: AC
  Filled 2021-01-07: qty 10

## 2021-01-07 MED ORDER — SODIUM CHLORIDE 0.9 % WEIGHT BASED INFUSION
3.0000 mL/kg/h | INTRAVENOUS | Status: AC
  Administered 2021-01-07: 3 mL/kg/h via INTRAVENOUS

## 2021-01-07 MED ORDER — HEPARIN SODIUM (PORCINE) 1000 UNIT/ML IJ SOLN
INTRAMUSCULAR | Status: DC | PRN
  Administered 2021-01-07: 3500 [IU] via INTRAVENOUS

## 2021-01-07 MED ORDER — MIDAZOLAM HCL 2 MG/2ML IJ SOLN
INTRAMUSCULAR | Status: AC
  Filled 2021-01-07: qty 2

## 2021-01-07 MED ORDER — LIDOCAINE HCL (PF) 1 % IJ SOLN
INTRAMUSCULAR | Status: DC | PRN
  Administered 2021-01-07: 2 mL

## 2021-01-07 MED ORDER — VERAPAMIL HCL 2.5 MG/ML IV SOLN
INTRAVENOUS | Status: DC | PRN
  Administered 2021-01-07: 10 mL via INTRA_ARTERIAL

## 2021-01-07 MED ORDER — SODIUM CHLORIDE 0.9 % WEIGHT BASED INFUSION: 1.0000 mL/kg/h | INTRAVENOUS | Status: DC

## 2021-01-07 MED ORDER — LIDOCAINE HCL (PF) 1 % IJ SOLN
INTRAMUSCULAR | Status: AC
  Filled 2021-01-07: qty 30

## 2021-01-07 MED ORDER — FENTANYL CITRATE (PF) 100 MCG/2ML IJ SOLN
INTRAMUSCULAR | Status: AC
  Filled 2021-01-07: qty 2

## 2021-01-07 MED ORDER — ASPIRIN 81 MG PO CHEW: 81.0000 mg | CHEWABLE_TABLET | ORAL | Status: DC

## 2021-01-07 MED ORDER — VERAPAMIL HCL 2.5 MG/ML IV SOLN
INTRAVENOUS | Status: AC
  Filled 2021-01-07: qty 2

## 2021-01-07 MED ORDER — IOHEXOL 350 MG/ML SOLN
INTRAVENOUS | Status: DC | PRN
  Administered 2021-01-07: 35 mL

## 2021-01-07 MED ORDER — SODIUM CHLORIDE 0.9% FLUSH: 3.0000 mL | INTRAVENOUS | Status: DC | PRN

## 2021-01-07 MED ORDER — SODIUM CHLORIDE 0.9% FLUSH: 3.0000 mL | Freq: Two times a day (BID) | INTRAVENOUS | Status: DC

## 2021-01-07 MED ORDER — NITROGLYCERIN 1 MG/10 ML FOR IR/CATH LAB
INTRA_ARTERIAL | Status: AC
  Filled 2021-01-07: qty 10

## 2021-01-07 MED ORDER — HEPARIN (PORCINE) IN NACL 1000-0.9 UT/500ML-% IV SOLN
INTRAVENOUS | Status: DC | PRN
  Administered 2021-01-07 (×2): 500 mL

## 2021-01-07 SURGICAL SUPPLY — 10 items
CATH OPTITORQUE TIG 4.0 5F (CATHETERS) ×2 IMPLANT
DEVICE RAD COMP TR BAND LRG (VASCULAR PRODUCTS) ×2 IMPLANT
GLIDESHEATH SLEND A-KIT 6F 22G (SHEATH) ×2 IMPLANT
GUIDEWIRE INQWIRE 1.5J.035X260 (WIRE) ×1 IMPLANT
INQWIRE 1.5J .035X260CM (WIRE) ×2
KIT HEART LEFT (KITS) ×2 IMPLANT
PACK CARDIAC CATHETERIZATION (CUSTOM PROCEDURE TRAY) ×2 IMPLANT
TRANSDUCER W/STOPCOCK (MISCELLANEOUS) ×2 IMPLANT
TUBING CIL FLEX 10 FLL-RA (TUBING) ×2 IMPLANT
WIRE HI TORQ VERSACORE-J 145CM (WIRE) ×2 IMPLANT

## 2021-01-07 NOTE — Interval H&P Note (Signed)
Cath Lab Visit (complete for each Cath Lab visit)  Clinical Evaluation Leading to the Procedure:   ACS: No.  Non-ACS:    Anginal Classification: CCS II  Anti-ischemic medical therapy: Minimal Therapy (1 class of medications)  Non-Invasive Test Results: No non-invasive testing performed  Prior CABG: No previous CABG      History and Physical Interval Note:  01/07/2021 10:41 AM  Cristian Fuller  has presented today for surgery, with the diagnosis of angina.  The various methods of treatment have been discussed with the patient and family. After consideration of risks, benefits and other options for treatment, the patient has consented to  Procedure(s): LEFT HEART CATH AND CORONARY ANGIOGRAPHY (N/A) as a surgical intervention.  The patient's history has been reviewed, patient examined, no change in status, stable for surgery.  I have reviewed the patient's chart and labs.  Questions were answered to the patient's satisfaction.     Quay Burow

## 2021-01-08 ENCOUNTER — Encounter: Payer: Medicaid Other | Admitting: Physical Therapy

## 2021-01-08 NOTE — Progress Notes (Signed)
  Please notify patient of results: Labs looks good. Ok to proceed with cardiac cath next week as planned. Also, I realized after our visit yesterday that patient is not on Aspirin. I would recommend he start Aspirin 81mg  daily.   Thank you!   Darreld Mclean, PA-C  01/02/2021  8:08 AM EST

## 2021-01-10 ENCOUNTER — Other Ambulatory Visit: Payer: Self-pay

## 2021-01-10 ENCOUNTER — Encounter: Payer: Self-pay | Admitting: Physical Therapy

## 2021-01-10 ENCOUNTER — Ambulatory Visit: Payer: Medicaid Other | Admitting: Physical Therapy

## 2021-01-10 DIAGNOSIS — M6281 Muscle weakness (generalized): Secondary | ICD-10-CM

## 2021-01-10 DIAGNOSIS — M25511 Pain in right shoulder: Secondary | ICD-10-CM

## 2021-01-10 DIAGNOSIS — M25611 Stiffness of right shoulder, not elsewhere classified: Secondary | ICD-10-CM

## 2021-01-10 NOTE — Patient Instructions (Signed)
Access Code: [email or web address] URL: [email or web address] Date: 01/10/2021 Prepared by: Almyra Free  Exercises Putty Squeezes - 3 x daily - 7 x weekly - 1 sets - 10 reps Seated Scapular Retraction - 3 x daily - 7 x weekly - 1 sets - 10 reps - 5 sec hold Flexion-Extension Shoulder Pendulum with Table Support - 3 x daily - 7 x weekly - 1 sets - 10 reps Supine Shoulder External Rotation with Dowel - 1 x daily - 7 x weekly - 1 sets - 10 reps - 2 sec hold Supine Shoulder Flexion Extension AAROM with Dowel - 1 x daily - 7 x weekly - 1 sets - 10 reps - 2 sec hold Supine Shoulder Abduction AAROM with Dowel - 1 x daily - 7 x weekly - 1 sets - 10 reps - 2 sec hold Seated Shoulder External Rotation AAROM with Cane and Hand in Neutral - 1 x daily - 7 x weekly - 1-2 sets - 10 reps Standing Shoulder Extension ROM with Dowel - 1 x daily - 7 x weekly - 1-2 sets - 10 reps Standing Bilateral Shoulder Internal Rotation AAROM with Dowel - 1 x daily - 7 x weekly - 1-2 sets - 10 reps

## 2021-01-10 NOTE — Therapy (Signed)
[facility] [address]  [place], [place], [address] Phone: [phone]   Fax:  [phone]  Physical Therapy Treatment  Patient Details  Name: Cristian Fuller MRN: [id number] Date of Birth: 1960/09/12 Referring Provider (PT): Ophelia Charter   Encounter Date: 01/10/2021   PT End of Session - 01/10/21 0758     Visit Number 4    Number of Visits 24    Date for PT Re-Evaluation 02/06/21    Authorization Type [employer] Medicaid    PT Start Time 0802    PT Stop Time 0846    PT Time Calculation (min) 44 min    Activity Tolerance Patient tolerated treatment well    Behavior During Therapy [facility] for tasks assessed/performed             Past Medical History:  Diagnosis Date   Allergic rhinitis    Hyperlipidemia    Left varicocele    history of left varicocele    Past Surgical History:  Procedure Laterality Date   BACK SURGERY     ESOPHAGOGASTRODUODENOSCOPY  2002/2007   LEFT HEART CATH AND CORONARY ANGIOGRAPHY N/A 10/24/2019   Procedure: LEFT HEART CATH AND CORONARY ANGIOGRAPHY;  Surgeon: Lorretta Harp, MD;  Location: [place] [facility];  Service: Cardiovascular;  Laterality: N/A;   LEFT HEART CATH AND CORONARY ANGIOGRAPHY N/A 01/07/2021   Procedure: LEFT HEART CATH AND CORONARY ANGIOGRAPHY;  Surgeon: Lorretta Harp, MD;  Location: [place] [facility];  Service: Cardiovascular;  Laterality: N/A;   SHOULDER ARTHROSCOPY WITH SUBACROMIAL DECOMPRESSION AND BICEP TENDON REPAIR Right 12/19/2020   Procedure: SHOULDER ARTHROSCOPY WITH SUBACROMIAL DECOMPRESSION AND BICEP TENDON REPAIR AND DISTAL CLAVICLE EXCISION;  Surgeon: Hiram Gash, MD;  Location: [place];  Service: Orthopedics;  Laterality: Right;    There were no vitals filed for this visit.   Subjective Assessment - 01/10/21 0802     Subjective Pt states his shoulder is getting better.    Currently in Pain? Yes    Pain Score 2    with  movement   Pain Location Shoulder    Pain Orientation Right    Pain Descriptors / Indicators Aching                [facility] PT Assessment - 01/10/21 0001       ROM / Strength   AROM / PROM / Strength AROM;PROM      AROM   AROM Assessment Site Shoulder;Elbow    Right/Left Shoulder Right    Right Shoulder Flexion 128 Degrees    Right Shoulder ABduction 118 Degrees    Right Shoulder Internal Rotation 60 Degrees    Right Shoulder External Rotation 76 Degrees    Right/Left Elbow Right    Right Elbow Flexion 136    Right Elbow Extension --   full     PROM   PROM Assessment Site Shoulder;Elbow    Right/Left Shoulder Right    Right Shoulder Flexion 150 Degrees    Right Shoulder ABduction 135 Degrees    Right Shoulder Internal Rotation 62 Degrees    Right Shoulder External Rotation 80 Degrees    Right/Left Elbow Right      Strength   Strength Assessment Site Shoulder;Elbow                           [facility] Adult PT Treatment/Exercise - 01/10/21 0001  Exercises   Exercises Shoulder      Shoulder Exercises: Seated   External Rotation AAROM;20 reps    External Rotation Limitations towel under arm      Shoulder Exercises: Standing   Internal Rotation AAROM;Both;20 reps    Internal Rotation Limitations cane    Flexion AAROM;Right;5 reps    Flexion Limitations pain at about 120 deg    ABduction AAROM;20 reps;Right    ABduction Limitations cane    Extension AAROM;20 reps;Both    Extension Limitations cane      Shoulder Exercises: Pulleys   Flexion 3 minutes    Scaption 3 minutes                     PT Education - 01/10/21 0951     Education Details HEP progressed; reminded to not take ROM beyond the limitations he feels    Person(s) Educated Patient    Methods Explanation;Demonstration;Handout    Comprehension Verbalized understanding;Returned demonstration              PT Short Term Goals - 01/10/21 0953       PT SHORT  TERM GOAL #1   Title Patient to be independent with initial HEP.    Status Achieved      PT SHORT TERM GOAL #2   Title Patient will demonstrate improved R shoulder ROM to 140 deg foward flexion without pain.    Baseline met with PROM    Status Partially Met               PT Long Term Goals - 01/03/21 1603       PT LONG TERM GOAL #1   Title Patient to be independent with advanced HEP to progress to gym based exercise program.    Time 12    Period Weeks    Status On-going      PT LONG TERM GOAL #2   Title Patient to demonstrate R UE strength >/=4+/5  without pain to be able to start gentle resistance training    Time 8    Period Weeks    Status On-going      PT LONG TERM GOAL #3   Title Patient to demonstrate R shoulder AROM WFL and without pain limiting.    Time 12    Period Weeks    Status On-going      PT LONG TERM GOAL #4   Title Patient will report >48 on FOTO to demonstrate improved function of R shoulder    Baseline 4    Time 12    Period Weeks    Status On-going                   Plan - 01/10/21 8206     Clinical Impression Statement Patient is progressing very well with AAROM and AROM; Measurements were taken for both active and passive range today. HEP was progressed and precautions reviewed.    Comorbidities history chronic L shoulder pain, history of heart disease    PT Frequency 2x / week    PT Duration 12 weeks    PT Treatment/Interventions ADLs/Self Care Home Management;Cryotherapy;Electrical Stimulation;Moist Heat;Therapeutic activities;Therapeutic exercise;Neuromuscular re-education;Patient/family education;Scar mobilization;Passive range of motion;Dry needling;Taping;Vasopneumatic Device;Joint Manipulations    PT Next Visit Plan review and progress HEP per surgical protocol.  Surgery date 12/19/20, phase 1 to 01/16/21.    PT Home Exercise Plan Access Code: [email or web address]    Consulted and Agree with Plan of Care Patient  Patient will benefit from skilled therapeutic intervention in order to improve the following deficits and impairments:  Decreased endurance, Decreased skin integrity, Increased muscle spasms, Decreased range of motion, Decreased scar mobility, Decreased activity tolerance, Decreased strength, Increased fascial restricitons, Impaired flexibility, Impaired UE functional use, Pain  Visit Diagnosis: Acute pain of right shoulder  Stiffness of right shoulder, not elsewhere classified  Muscle weakness (generalized)     Problem List Patient Active Problem List   Diagnosis Date Noted   CAD (coronary artery disease) 01/07/2021   Chronic right shoulder pain 07/17/2020   Left tennis elbow 07/17/2020   Carpal tunnel syndrome on both sides 07/17/2020   Atypical chest pain 10/18/2019   Hemoptysis 10/18/2012   Preventative health care 04/07/2011   NUMBNESS, HAND 12/27/2007   HYPERLIPIDEMIA 12/24/2006   ALLERGIC RHINITIS 12/24/2006   GERD 12/24/2006   TB SKIN TEST, POSITIVE 12/24/2006    Madelyn Flavors, PT 01/10/2021, 9:57 AM  [facility] [address]  [place], [place], [address] Phone: [phone]   Fax:  [phone]  Name: Cristian Fuller MRN: [id number] Date of Birth: [birth year]

## 2021-01-14 ENCOUNTER — Ambulatory Visit: Payer: Medicaid Other | Admitting: Physical Therapy

## 2021-01-14 ENCOUNTER — Other Ambulatory Visit: Payer: Self-pay

## 2021-01-14 ENCOUNTER — Encounter: Payer: Self-pay | Admitting: Physical Therapy

## 2021-01-14 DIAGNOSIS — R293 Abnormal posture: Secondary | ICD-10-CM

## 2021-01-14 DIAGNOSIS — M25522 Pain in left elbow: Secondary | ICD-10-CM

## 2021-01-14 DIAGNOSIS — M6281 Muscle weakness (generalized): Secondary | ICD-10-CM

## 2021-01-14 DIAGNOSIS — M25511 Pain in right shoulder: Secondary | ICD-10-CM

## 2021-01-14 DIAGNOSIS — M25611 Stiffness of right shoulder, not elsewhere classified: Secondary | ICD-10-CM

## 2021-01-14 NOTE — Therapy (Signed)
[facility] [address]  [address] [place], [place], [address] Phone: [phone]   Fax:  [phone]  Physical Therapy Treatment  Patient Details  Name: Cristian Fuller MRN: [id number] Date of Birth: 1960/06/10 Referring Provider (PT): Ramond Marrow   Encounter Date: 01/14/2021   PT End of Session - 01/14/21 0804     Visit Number 5    Number of Visits 24    Date for PT Re-Evaluation 02/06/21    Authorization Type [employer]    PT Start Time 0801    PT Stop Time 0842    PT Time Calculation (min) 41 min    Activity Tolerance Patient tolerated treatment well    Behavior During Therapy [facility] for tasks assessed/performed             Past Medical History:  Diagnosis Date   Allergic rhinitis    Hyperlipidemia    Left varicocele    history of left varicocele    Past Surgical History:  Procedure Laterality Date   BACK SURGERY     ESOPHAGOGASTRODUODENOSCOPY  2002/2007   LEFT HEART CATH AND CORONARY ANGIOGRAPHY N/A 10/24/2019   Procedure: LEFT HEART CATH AND CORONARY ANGIOGRAPHY;  Surgeon: Runell Gess, MD;  Location: [facility];  Service: Cardiovascular;  Laterality: N/A;   LEFT HEART CATH AND CORONARY ANGIOGRAPHY N/A 01/07/2021   Procedure: LEFT HEART CATH AND CORONARY ANGIOGRAPHY;  Surgeon: Runell Gess, MD;  Location: [facility];  Service: Cardiovascular;  Laterality: N/A;   SHOULDER ARTHROSCOPY WITH SUBACROMIAL DECOMPRESSION AND BICEP TENDON REPAIR Right 12/19/2020   Procedure: SHOULDER ARTHROSCOPY WITH SUBACROMIAL DECOMPRESSION AND BICEP TENDON REPAIR AND DISTAL CLAVICLE EXCISION;  Surgeon: Bjorn Pippin, MD;  Location: [facility];  Service: Orthopedics;  Laterality: Right;    There were no vitals filed for this visit.   Subjective Assessment - 01/14/21 0804     Subjective Pt reports he no longer has any pain up his neck or headaches.    Currently in Pain? Yes     Pain Score 2     Pain Location Shoulder    Pain Orientation Right    Pain Descriptors / Indicators Aching                [facility] PT Assessment - 01/14/21 0001       Assessment   Next MD Visit 01/29/21      AROM   AROM Assessment Site Shoulder    Right/Left Shoulder Right    Right Shoulder Flexion 155 Degrees    Right Shoulder ABduction 142 Degrees    Right Shoulder Internal Rotation 60 Degrees    Right Shoulder External Rotation 76 Degrees                           [facility] Adult PT Treatment/Exercise - 01/14/21 0001       Elbow Exercises   Elbow Flexion AROM;10 reps    Elbow Extension AROM;10 reps    Forearm Supination Right;15 reps;AROM    Forearm Pronation AROM;Right;15 reps      Shoulder Exercises: Standing   Internal Rotation AAROM;Both;20 reps    Flexion Right;10 reps;AROM    ABduction Right;10 reps;AROM;AAROM    ABduction Limitations also wall ladder x 10    Extension AROM;10 reps      Shoulder Exercises: Pulleys   Flexion 3 minutes    ABduction 3 minutes  Manual Therapy   Manual Therapy Passive ROM    Passive ROM Shoulder flexion, scaption, ER, IR PROM; elbow flex/ext                       PT Short Term Goals - 01/10/21 3704       PT SHORT TERM GOAL #1   Title Patient to be independent with initial HEP.    Status Achieved      PT SHORT TERM GOAL #2   Title Patient will demonstrate improved R shoulder ROM to 140 deg foward flexion without pain.    Baseline met with PROM    Status Partially Met               PT Long Term Goals - 01/03/21 1603       PT LONG TERM GOAL #1   Title Patient to be independent with advanced HEP to progress to gym based exercise program.    Time 12    Period Weeks    Status On-going      PT LONG TERM GOAL #2   Title Patient to demonstrate R UE strength >/=4+/5  without pain to be able to start gentle resistance training    Time 8    Period Weeks    Status On-going      PT  LONG TERM GOAL #3   Title Patient to demonstrate R shoulder AROM WFL and without pain limiting.    Time 12    Period Weeks    Status On-going      PT LONG TERM GOAL #4   Title Patient will report >48 on FOTO to demonstrate improved function of R shoulder    Baseline 4    Time 12    Period Weeks    Status On-going                   Plan - 01/14/21 0843     Clinical Impression Statement Patient continues to progress with ROM in flex and ABD. Returns to MD on 01/29/21. Should be ready to progress to Phase 2 next visit.    Comorbidities history chronic L shoulder pain, history of heart disease    PT Frequency 2x / week    PT Duration 12 weeks    PT Treatment/Interventions ADLs/Self Care Home Management;Cryotherapy;Electrical Stimulation;Moist Heat;Therapeutic activities;Therapeutic exercise;Neuromuscular re-education;Patient/family education;Scar mobilization;Passive range of motion;Dry needling;Taping;Vasopneumatic Device;Joint Manipulations    PT Next Visit Plan review and progress HEP per surgical protocol.  Surgery date 12/19/20, phase 1 to 01/16/21.    PT Home Exercise Plan Access Code: [email or web address]    Consulted and Agree with Plan of Care Patient             Patient will benefit from skilled therapeutic intervention in order to improve the following deficits and impairments:  Decreased endurance, Decreased skin integrity, Increased muscle spasms, Decreased range of motion, Decreased scar mobility, Decreased activity tolerance, Decreased strength, Increased fascial restricitons, Impaired flexibility, Impaired UE functional use, Pain  Visit Diagnosis: Acute pain of right shoulder  Stiffness of right shoulder, not elsewhere classified  Muscle weakness (generalized)  Pain in left elbow  Abnormal posture     Problem List Patient Active Problem List   Diagnosis Date Noted   CAD (coronary artery disease) 01/07/2021   Chronic right shoulder pain 07/17/2020   Left  tennis elbow 07/17/2020   Carpal tunnel syndrome on both sides 07/17/2020   Atypical chest pain 10/18/2019   Hemoptysis  10/18/2012   Preventative health care 04/07/2011   NUMBNESS, HAND 12/27/2007   HYPERLIPIDEMIA 12/24/2006   ALLERGIC RHINITIS 12/24/2006   GERD 12/24/2006   TB SKIN TEST, POSITIVE 12/24/2006   Madelyn Flavors PT 01/14/2021, 4:30 PM  [facility] [address]  [place], [place], [address] Phone: [phone]   Fax:  [phone]  Name: Cristian Fuller MRN: [id number] Date of Birth: 02/28/1960

## 2021-01-17 ENCOUNTER — Encounter: Payer: Self-pay | Admitting: Physical Therapy

## 2021-01-17 ENCOUNTER — Other Ambulatory Visit: Payer: Self-pay

## 2021-01-17 ENCOUNTER — Ambulatory Visit: Payer: Medicaid Other | Admitting: Physical Therapy

## 2021-01-17 DIAGNOSIS — R293 Abnormal posture: Secondary | ICD-10-CM

## 2021-01-17 DIAGNOSIS — M25511 Pain in right shoulder: Secondary | ICD-10-CM

## 2021-01-17 DIAGNOSIS — M25522 Pain in left elbow: Secondary | ICD-10-CM

## 2021-01-17 DIAGNOSIS — M6281 Muscle weakness (generalized): Secondary | ICD-10-CM

## 2021-01-17 DIAGNOSIS — M25611 Stiffness of right shoulder, not elsewhere classified: Secondary | ICD-10-CM

## 2021-01-17 NOTE — Patient Instructions (Signed)
Access Code: [email or web address] URL: [email or web address] Date: 01/17/2021 Prepared by: Almyra Free  Exercises Putty Squeezes - 3 x daily - 7 x weekly - 1 sets - 10 reps Seated Scapular Retraction - 3 x daily - 7 x weekly - 1 sets - 10 reps - 5 sec hold Flexion-Extension Shoulder Pendulum with Table Support - 3 x daily - 7 x weekly - 1 sets - 10 reps Supine Shoulder External Rotation with Dowel - 1 x daily - 7 x weekly - 1 sets - 10 reps - 2 sec hold Supine Shoulder Flexion Extension AAROM with Dowel - 1 x daily - 7 x weekly - 1 sets - 10 reps - 2 sec hold Supine Shoulder Abduction AAROM with Dowel - 1 x daily - 7 x weekly - 1 sets - 10 reps - 2 sec hold Seated Shoulder External Rotation AAROM with Cane and Hand in Neutral - 1 x daily - 7 x weekly - 1-2 sets - 10 reps Standing Shoulder Extension ROM with Dowel - 1 x daily - 7 x weekly - 1-2 sets - 10 reps Standing Bilateral Shoulder Internal Rotation AAROM with Dowel - 1 x daily - 7 x weekly - 1-2 sets - 10 reps Isometric Shoulder Flexion at Wall - 1 x daily - 7 x weekly - 5 reps - 1 sets - 10 sec hold Isometric Shoulder Abduction - Arm Straight at Wall (Mirrored) - 1 x daily - 7 x weekly - 5 reps - 1 sets - 10 sec hold Isometric Shoulder Extension at Wall (Mirrored) - 1 x daily - 7 x weekly - 5 reps - 1 sets - 10 sec hold Standing Isometric Shoulder Internal Rotation at Doorway - 1 x daily - 7 x weekly - 5 reps - 1 sets - 10 sec hold Standing Isometric Shoulder External Rotation with Doorway (Mirrored) - 1 x daily - 7 x weekly - 5 reps - 1 sets - 10 sec hold

## 2021-01-17 NOTE — Therapy (Signed)
[facility] [address]  [place], [place], [address] Phone: [phone]   Fax:  [phone]  Physical Therapy Treatment  Patient Details  Name: Cristian Fuller MRN: [id number] Date of Birth: Dec 14, 1960 Referring Provider (PT): Ophelia Charter   Encounter Date: 01/17/2021   PT End of Session - 01/17/21 0801     Visit Number 6    Number of Visits 24    Date for PT Re-Evaluation 02/06/21    Authorization Type [employer]    PT Start Time 0800    PT Stop Time 0848    PT Time Calculation (min) 48 min    Activity Tolerance Patient tolerated treatment well    Behavior During Therapy [facility] for tasks assessed/performed             Past Medical History:  Diagnosis Date   Allergic rhinitis    Hyperlipidemia    Left varicocele    history of left varicocele    Past Surgical History:  Procedure Laterality Date   BACK SURGERY     ESOPHAGOGASTRODUODENOSCOPY  2002/2007   LEFT HEART CATH AND CORONARY ANGIOGRAPHY N/A 10/24/2019   Procedure: LEFT HEART CATH AND CORONARY ANGIOGRAPHY;  Surgeon: Lorretta Harp, MD;  Location: [facility];  Service: Cardiovascular;  Laterality: N/A;   LEFT HEART CATH AND CORONARY ANGIOGRAPHY N/A 01/07/2021   Procedure: LEFT HEART CATH AND CORONARY ANGIOGRAPHY;  Surgeon: Lorretta Harp, MD;  Location: [facility];  Service: Cardiovascular;  Laterality: N/A;   SHOULDER ARTHROSCOPY WITH SUBACROMIAL DECOMPRESSION AND BICEP TENDON REPAIR Right 12/19/2020   Procedure: SHOULDER ARTHROSCOPY WITH SUBACROMIAL DECOMPRESSION AND BICEP TENDON REPAIR AND DISTAL CLAVICLE EXCISION;  Surgeon: Hiram Gash, MD;  Location: [facility];  Service: Orthopedics;  Laterality: Right;    There were no vitals filed for this visit.                      [facility] Adult PT Treatment/Exercise - 01/17/21 0001       Shoulder Exercises: Standing   Protraction Both;5 reps     Protraction Limitations 10 sec hold at wall    External Rotation Both;10 reps    External Rotation Limitations with small noodle along spine; also W position x 10    Flexion Right;10 reps;AROM    Other Standing Exercises prayer isometric 10 sec x 5    Other Standing Exercises flex/scap/ABD to 90 deg (or to pain) x 7 ea stopped due to fatigue      Shoulder Exercises: Pulleys   Flexion 3 minutes    Scaption Limitations 1.5 min    ABduction Limitations 1.5 min      Shoulder Exercises: Isometric Strengthening   Flexion 5X10"    Extension 5X10"    External Rotation 5X10"    Internal Rotation 5X10"    ABduction 5X10"      Modalities   Modalities Vasopneumatic      Vasopneumatic   Number Minutes Vasopneumatic  10 minutes    Vasopnuematic Location  Shoulder    Vasopneumatic Pressure Low    Vasopneumatic Temperature  34                     PT Education - 01/17/21 0839     Education Details HEP    Person(s) Educated Patient    Methods Explanation;Demonstration;Handout;Verbal cues    Comprehension Verbalized understanding;Returned demonstration  PT Short Term Goals - 01/10/21 0953       PT SHORT TERM GOAL #1   Title Patient to be independent with initial HEP.    Status Achieved      PT SHORT TERM GOAL #2   Title Patient will demonstrate improved R shoulder ROM to 140 deg foward flexion without pain.    Baseline met with PROM    Status Partially Met               PT Long Term Goals - 01/03/21 1603       PT LONG TERM GOAL #1   Title Patient to be independent with advanced HEP to progress to gym based exercise program.    Time 12    Period Weeks    Status On-going      PT LONG TERM GOAL #2   Title Patient to demonstrate R UE strength >/=4+/5  without pain to be able to start gentle resistance training    Time 8    Period Weeks    Status On-going      PT LONG TERM GOAL #3   Title Patient to demonstrate R shoulder AROM WFL and  without pain limiting.    Time 12    Period Weeks    Status On-going      PT LONG TERM GOAL #4   Title Patient will report >48 on FOTO to demonstrate improved function of R shoulder    Baseline 4    Time 12    Period Weeks    Status On-going                   Plan - 01/17/21 0843     Clinical Impression Statement Patient able to progress with isometrics today. He tolerated well. Fatigues fairly quickly with AROM. Ended with vaso today secondary to increased soreness from new TE with good response.    Comorbidities history chronic L shoulder pain, history of heart disease    PT Treatment/Interventions ADLs/Self Care Home Management;Cryotherapy;Electrical Stimulation;Moist Heat;Therapeutic activities;Therapeutic exercise;Neuromuscular re-education;Patient/family education;Scar mobilization;Passive range of motion;Dry needling;Taping;Vasopneumatic Device;Joint Manipulations    PT Next Visit Plan review and progress HEP per surgical protocol.  Surgery date 12/19/20, phase 2 to 02/06/21. MD 01/29/21    PT Home Exercise Plan Access Code: [email or web address]             Patient will benefit from skilled therapeutic intervention in order to improve the following deficits and impairments:  Decreased endurance, Decreased skin integrity, Increased muscle spasms, Decreased range of motion, Decreased scar mobility, Decreased activity tolerance, Decreased strength, Increased fascial restricitons, Impaired flexibility, Impaired UE functional use, Pain  Visit Diagnosis: Muscle weakness (generalized)  Acute pain of right shoulder  Stiffness of right shoulder, not elsewhere classified  Pain in left elbow  Abnormal posture     Problem List Patient Active Problem List   Diagnosis Date Noted   CAD (coronary artery disease) 01/07/2021   Chronic right shoulder pain 07/17/2020   Left tennis elbow 07/17/2020   Carpal tunnel syndrome on both sides 07/17/2020   Atypical chest pain 10/18/2019    Hemoptysis 10/18/2012   Preventative health care 04/07/2011   NUMBNESS, HAND 12/27/2007   HYPERLIPIDEMIA 12/24/2006   ALLERGIC RHINITIS 12/24/2006   GERD 12/24/2006   TB SKIN TEST, POSITIVE 12/24/2006   Solon Palm, PT 01/17/2021, 11:00 AM  [facility] [address]  [address] [place], [place], [address] Phone: [phone]  Fax:  [phone]  Name: Cristian Fuller MRN: [id number] Date of Birth: 26-Oct-1960

## 2021-01-21 ENCOUNTER — Encounter: Payer: Self-pay | Admitting: Physical Therapy

## 2021-01-21 ENCOUNTER — Ambulatory Visit: Payer: Medicaid Other | Admitting: Physical Therapy

## 2021-01-21 ENCOUNTER — Other Ambulatory Visit: Payer: Self-pay

## 2021-01-21 DIAGNOSIS — M25522 Pain in left elbow: Secondary | ICD-10-CM

## 2021-01-21 DIAGNOSIS — M25511 Pain in right shoulder: Secondary | ICD-10-CM

## 2021-01-21 DIAGNOSIS — M6281 Muscle weakness (generalized): Secondary | ICD-10-CM

## 2021-01-21 DIAGNOSIS — R293 Abnormal posture: Secondary | ICD-10-CM

## 2021-01-21 DIAGNOSIS — M25611 Stiffness of right shoulder, not elsewhere classified: Secondary | ICD-10-CM

## 2021-01-21 NOTE — Therapy (Signed)
[facility] [address]  [place], [place], [address] Phone: [phone]   Fax:  [phone]  Physical Therapy Treatment  Patient Details  Name: Cristian Fuller MRN: [id number] Date of Birth: 1961/01/16 Referring Provider (PT): Ophelia Charter   Encounter Date: 01/21/2021   PT End of Session - 01/21/21 0807     Visit Number 7    Number of Visits 24    Date for PT Re-Evaluation 02/06/21    Authorization Type [employer]    PT Start Time 0803    PT Stop Time 5188    PT Time Calculation (min) 44 min    Activity Tolerance Patient tolerated treatment well;Patient limited by pain    Behavior During Therapy [facility] for tasks assessed/performed             Past Medical History:  Diagnosis Date   Allergic rhinitis    Hyperlipidemia    Left varicocele    history of left varicocele    Past Surgical History:  Procedure Laterality Date   BACK SURGERY     ESOPHAGOGASTRODUODENOSCOPY  2002/2007   LEFT HEART CATH AND CORONARY ANGIOGRAPHY N/A 10/24/2019   Procedure: LEFT HEART CATH AND CORONARY ANGIOGRAPHY;  Surgeon: Lorretta Harp, MD;  Location: [facility];  Service: Cardiovascular;  Laterality: N/A;   LEFT HEART CATH AND CORONARY ANGIOGRAPHY N/A 01/07/2021   Procedure: LEFT HEART CATH AND CORONARY ANGIOGRAPHY;  Surgeon: Lorretta Harp, MD;  Location: [facility];  Service: Cardiovascular;  Laterality: N/A;   SHOULDER ARTHROSCOPY WITH SUBACROMIAL DECOMPRESSION AND BICEP TENDON REPAIR Right 12/19/2020   Procedure: SHOULDER ARTHROSCOPY WITH SUBACROMIAL DECOMPRESSION AND BICEP TENDON REPAIR AND DISTAL CLAVICLE EXCISION;  Surgeon: Hiram Gash, MD;  Location: [facility];  Service: Orthopedics;  Laterality: Right;    There were no vitals filed for this visit.   Subjective Assessment - 01/21/21 0808     Subjective Pt reporting increased pain starting yesterday. He states he felt stiff  on Saturday since didn't do exercise, so did OH flexion with cane x 100 times. He didn't have any pain when doing AAROM.    Currently in Pain? Yes    Pain Score 5     Pain Location Shoulder    Pain Orientation Right    Pain Descriptors / Indicators Aching    Pain Type Acute pain;Surgical pain                [facility] PT Assessment - 01/21/21 0001       PROM   Right Shoulder Flexion 170 Degrees    Right Shoulder ABduction 140 Degrees    Right Shoulder Internal Rotation 65 Degrees    Right Shoulder External Rotation 75 Degrees                           [facility] Adult PT Treatment/Exercise - 01/21/21 0001       Shoulder Exercises: Sidelying   External Rotation AROM;Right;10 reps    Flexion AROM;Right;10 reps    ABduction AROM;Right;10 reps      Shoulder Exercises: Standing   External Rotation Both;10 reps    External Rotation Limitations with small noodle along spine; also W position x 10      Shoulder Exercises: Pulleys   Flexion 3 minutes    Scaption Limitations 1.5 min    ABduction Limitations 1.5 min      Shoulder  Exercises: Isometric Strengthening   Flexion 5X10"    Extension 5X10"    External Rotation 5X10"    Internal Rotation 5X10"    Internal Rotation Limitations feels the most    ABduction 5X10"      Modalities   Modalities Vasopneumatic      Vasopneumatic   Number Minutes Vasopneumatic  10 minutes    Vasopnuematic Location  Shoulder    Vasopneumatic Pressure Low    Vasopneumatic Temperature  34      Manual Therapy   Manual therapy comments measurements taken                       PT Short Term Goals - 01/10/21 0953       PT SHORT TERM GOAL #1   Title Patient to be independent with initial HEP.    Status Achieved      PT SHORT TERM GOAL #2   Title Patient will demonstrate improved R shoulder ROM to 140 deg foward flexion without pain.    Baseline met with PROM    Status Partially Met               PT Long  Term Goals - 01/03/21 1603       PT LONG TERM GOAL #1   Title Patient to be independent with advanced HEP to progress to gym based exercise program.    Time 12    Period Weeks    Status On-going      PT LONG TERM GOAL #2   Title Patient to demonstrate R UE strength >/=4+/5  without pain to be able to start gentle resistance training    Time 8    Period Weeks    Status On-going      PT LONG TERM GOAL #3   Title Patient to demonstrate R shoulder AROM WFL and without pain limiting.    Time 12    Period Weeks    Status On-going      PT LONG TERM GOAL #4   Title Patient will report >48 on FOTO to demonstrate improved function of R shoulder    Baseline 4    Time 12    Period Weeks    Status On-going                   Plan - 01/21/21 0840     Clinical Impression Statement Patient reporting increased pain today after increasing AAROM over the weekend. He tolerated TE well today. Some increased pain reported with active flexion in standing and supine and also with IR isometrics. We modified TE to adapt to this and pt was advised to decrease reps of AAROM (he had done 100 reps)and frequency isometrics to every other day until pain comes back down. LTGs are ongoing. Patient also states that his Dr. had advised him to wear his sling until 6 weeks, but stopped wearing last week (protocol says 4 weeks). Advised that this may be cause of some increased sx as well.    Comorbidities history chronic L shoulder pain, history of heart disease    PT Frequency 2x / week    PT Duration 12 weeks    PT Treatment/Interventions ADLs/Self Care Home Management;Cryotherapy;Electrical Stimulation;Moist Heat;Therapeutic activities;Therapeutic exercise;Neuromuscular re-education;Patient/family education;Scar mobilization;Passive range of motion;Dry needling;Taping;Vasopneumatic Device;Joint Manipulations    PT Next Visit Plan review and progress HEP per surgical protocol.  Surgery date 12/19/20, phase 2  to 02/06/21. MD 01/29/21    Consulted and  Agree with Plan of Care Patient             Patient will benefit from skilled therapeutic intervention in order to improve the following deficits and impairments:  Decreased endurance, Decreased skin integrity, Increased muscle spasms, Decreased range of motion, Decreased scar mobility, Decreased activity tolerance, Decreased strength, Increased fascial restricitons, Impaired flexibility, Impaired UE functional use, Pain  Visit Diagnosis: Acute pain of right shoulder  Stiffness of right shoulder, not elsewhere classified  Muscle weakness (generalized)  Pain in left elbow  Abnormal posture     Problem List Patient Active Problem List   Diagnosis Date Noted   CAD (coronary artery disease) 01/07/2021   Chronic right shoulder pain 07/17/2020   Left tennis elbow 07/17/2020   Carpal tunnel syndrome on both sides 07/17/2020   Atypical chest pain 10/18/2019   Hemoptysis 10/18/2012   Preventative health care 04/07/2011   NUMBNESS, HAND 12/27/2007   HYPERLIPIDEMIA 12/24/2006   ALLERGIC RHINITIS 12/24/2006   GERD 12/24/2006   TB SKIN TEST, POSITIVE 12/24/2006   Madelyn Flavors, PT 01/21/2021, 9:06 PM  [facility] [address]  [place], [place], [address] Phone: [phone]   Fax:  [phone]  Name: Cristian Fuller MRN: [id number] Date of Birth: 30-Nov-1960

## 2021-01-24 ENCOUNTER — Ambulatory Visit: Payer: Medicaid Other | Admitting: Physical Therapy

## 2021-01-24 ENCOUNTER — Other Ambulatory Visit: Payer: Self-pay

## 2021-01-24 ENCOUNTER — Encounter: Payer: Self-pay | Admitting: Physical Therapy

## 2021-01-24 DIAGNOSIS — M25522 Pain in left elbow: Secondary | ICD-10-CM

## 2021-01-24 DIAGNOSIS — M25511 Pain in right shoulder: Secondary | ICD-10-CM | POA: Diagnosis not present

## 2021-01-24 DIAGNOSIS — R293 Abnormal posture: Secondary | ICD-10-CM

## 2021-01-24 DIAGNOSIS — M6281 Muscle weakness (generalized): Secondary | ICD-10-CM

## 2021-01-24 DIAGNOSIS — M25611 Stiffness of right shoulder, not elsewhere classified: Secondary | ICD-10-CM

## 2021-01-24 NOTE — Patient Instructions (Signed)
Access Code: [email or web address] URL: [email or web address] Date: 01/24/2021 Prepared by: Almyra Free  Exercises Putty Squeezes - 3 x daily - 7 x weekly - 1 sets - 10 reps Seated Scapular Retraction - 3 x daily - 7 x weekly - 1 sets - 10 reps - 5 sec hold Flexion-Extension Shoulder Pendulum with Table Support - 3 x daily - 7 x weekly - 1 sets - 10 reps Supine Shoulder External Rotation with Dowel - 1 x daily - 7 x weekly - 1 sets - 10 reps - 2 sec hold Supine Shoulder Flexion Extension AAROM with Dowel - 1 x daily - 7 x weekly - 1 sets - 10 reps - 2 sec hold Supine Shoulder Abduction AAROM with Dowel - 1 x daily - 7 x weekly - 1 sets - 10 reps - 2 sec hold Seated Shoulder External Rotation AAROM with Cane and Hand in Neutral - 1 x daily - 7 x weekly - 1-2 sets - 10 reps Standing Shoulder Extension ROM with Dowel - 1 x daily - 7 x weekly - 1-2 sets - 10 reps Standing Bilateral Shoulder Internal Rotation AAROM with Dowel - 1 x daily - 7 x weekly - 1-2 sets - 10 reps Isometric Shoulder Flexion at Wall - 1 x daily - 7 x weekly - 1 sets - 5 reps - 10 sec hold Isometric Shoulder Abduction - Arm Straight at Wall (Mirrored) - 1 x daily - 7 x weekly - 1 sets - 5 reps - 10 sec hold Isometric Shoulder Extension at Wall (Mirrored) - 1 x daily - 7 x weekly - 1 sets - 5 reps - 10 sec hold Standing Isometric Shoulder Internal Rotation at Doorway - 1 x daily - 7 x weekly - 1 sets - 5 reps - 10 sec hold Standing Isometric Shoulder External Rotation with Doorway (Mirrored) - 1 x daily - 7 x weekly - 1 sets - 5 reps - 10 sec hold Standing Shoulder Internal Rotation with Anchored Resistance - 1 x daily - 3 x weekly - 1 sets - 10 reps Shoulder External Rotation and Scapular Retraction with Resistance - 1 x daily - 3 x weekly - 1 sets - 10 reps Single Arm Punch with Resistance - 1 x daily - 3 x weekly - 1 sets - 10 reps Standing Single Arm Low Row with Anchored Resistance - 1 x daily - 3 x weekly - 1 sets - 10  reps

## 2021-01-24 NOTE — Therapy (Signed)
[facility] [address]  [place], [place], [address] Phone: [phone]   Fax:  [phone]  Physical Therapy Treatment  Patient Details  Name: Cristian Fuller MRN: [id number] Date of Birth: Oct 14, 1960 Referring Provider (PT): Ophelia Charter   Encounter Date: 01/24/2021   PT End of Session - 01/24/21 0801     Visit Number 8    Number of Visits 24    Date for PT Re-Evaluation 02/06/21    Authorization Type [employer]    PT Start Time 0800    PT Stop Time 0848    PT Time Calculation (min) 48 min    Activity Tolerance Patient tolerated treatment well    Behavior During Therapy [facility] for tasks assessed/performed             Past Medical History:  Diagnosis Date   Allergic rhinitis    Hyperlipidemia    Left varicocele    history of left varicocele    Past Surgical History:  Procedure Laterality Date   BACK SURGERY     ESOPHAGOGASTRODUODENOSCOPY  2002/2007   LEFT HEART CATH AND CORONARY ANGIOGRAPHY N/A 10/24/2019   Procedure: LEFT HEART CATH AND CORONARY ANGIOGRAPHY;  Surgeon: Lorretta Harp, MD;  Location: [place] [facility];  Service: Cardiovascular;  Laterality: N/A;   LEFT HEART CATH AND CORONARY ANGIOGRAPHY N/A 01/07/2021   Procedure: LEFT HEART CATH AND CORONARY ANGIOGRAPHY;  Surgeon: Lorretta Harp, MD;  Location: [place] [facility];  Service: Cardiovascular;  Laterality: N/A;   SHOULDER ARTHROSCOPY WITH SUBACROMIAL DECOMPRESSION AND BICEP TENDON REPAIR Right 12/19/2020   Procedure: SHOULDER ARTHROSCOPY WITH SUBACROMIAL DECOMPRESSION AND BICEP TENDON REPAIR AND DISTAL CLAVICLE EXCISION;  Surgeon: Hiram Gash, MD;  Location: [place];  Service: Orthopedics;  Laterality: Right;    There were no vitals filed for this visit.   Subjective Assessment - 01/24/21 0803     Subjective Pt reporting pain is about the same.    Currently in Pain? Yes    Pain Score 5     Pain  Location Shoulder    Pain Orientation Right    Pain Descriptors / Indicators Aching    Pain Type Acute pain;Surgical pain                [facility] PT Assessment - 01/24/21 0001       AROM   Right Shoulder Flexion 163 Degrees    Right Shoulder ABduction 138 Degrees    Right Shoulder Internal Rotation 60 Degrees    Right Shoulder External Rotation 72 Degrees                           [facility] Adult PT Treatment/Exercise - 01/24/21 0001       Shoulder Exercises: Supine   External Rotation AROM;10 reps;Strengthening;Theraband    Theraband Level (Shoulder External Rotation) Level 1 (Yellow)    Flexion AROM;10 reps    Flexion Limitations cues for scapular retraction    ABduction AROM;10 reps    Other Supine Exercises scaption x 10      Shoulder Exercises: Standing   External Rotation Both;Strengthening;Theraband    Theraband Level (Shoulder External Rotation) Level 1 (Yellow)    External Rotation Limitations just a few reps for HEP demo as pt already did supine    Internal Rotation Strengthening;Right;20 reps;Theraband    Theraband Level (Shoulder Internal Rotation) Level 1 (Yellow)    Flexion  Strengthening;Right;20 reps    Theraband Level (Shoulder Flexion) Level 1 (Yellow)    Flexion Limitations single arm punch    Row Strengthening;Right;10 reps;Theraband    Theraband Level (Shoulder Row) Level 1 (Yellow)    Row Limitations to midline      Shoulder Exercises: Pulleys   Flexion 3 minutes    Scaption Limitations 1.5 min    ABduction Limitations 1.5 min      Vasopneumatic   Number Minutes Vasopneumatic  10 minutes    Vasopnuematic Location  Shoulder    Vasopneumatic Pressure Low    Vasopneumatic Temperature  34                     PT Education - 01/24/21 0845     Education Details initiated band exercises    Person(s) Educated Patient    Methods Explanation;Demonstration;Handout    Comprehension Verbalized understanding;Returned  demonstration              PT Short Term Goals - 01/24/21 0846       PT SHORT TERM GOAL #1   Title Patient to be independent with initial HEP.    Status Achieved      PT SHORT TERM GOAL #2   Title Patient will demonstrate improved R shoulder ROM to 140 deg foward flexion without pain.    Status Achieved               PT Long Term Goals - 01/03/21 1603       PT LONG TERM GOAL #1   Title Patient to be independent with advanced HEP to progress to gym based exercise program.    Time 12    Period Weeks    Status On-going      PT LONG TERM GOAL #2   Title Patient to demonstrate R UE strength >/=4+/5  without pain to be able to start gentle resistance training    Time 8    Period Weeks    Status On-going      PT LONG TERM GOAL #3   Title Patient to demonstrate R shoulder AROM WFL and without pain limiting.    Time 12    Period Weeks    Status On-going      PT LONG TERM GOAL #4   Title Patient will report >48 on FOTO to demonstrate improved function of R shoulder    Baseline 4    Time 12    Period Weeks    Status On-going                   Plan - 01/24/21 0851     Clinical Impression Statement Cristian Fuller is doing very well with ROM and strengthening. He is on track with protocol goals. He did "overdo" it in mid-December with AAROM resulting in increased soreness, but this resolved and we were able to initiate bands today without increased pain. Patient was advised to hold isometrics and only do bands 3x/wk to avoid tendonitis and he verbally reported understanding. He has some pain beyond 140 deg flexion actively. He will continue to benefit from skilled PT to progress strengthening and ROM to achieve functional goals.    Comorbidities history chronic L shoulder pain, history of heart disease    PT Frequency 2x / week    PT Duration 12 weeks    PT Treatment/Interventions ADLs/Self Care Home Management;Cryotherapy;Electrical Stimulation;Moist Heat;Therapeutic  activities;Therapeutic exercise;Neuromuscular re-education;Patient/family education;Scar mobilization;Passive range of motion;Dry needling;Taping;Vasopneumatic Device;Joint Manipulations  PT Next Visit Plan review and progress HEP per surgical protocol.  Surgery date 12/19/20, phase 2 to 02/06/21. MD 01/29/21 (MD note sent 12/22)    PT Home Exercise Plan Access Code: [email or web address]    Consulted and Agree with Plan of Care Patient             Patient will benefit from skilled therapeutic intervention in order to improve the following deficits and impairments:  Decreased endurance, Decreased skin integrity, Increased muscle spasms, Decreased range of motion, Decreased scar mobility, Decreased activity tolerance, Decreased strength, Increased fascial restricitons, Impaired flexibility, Impaired UE functional use, Pain  Visit Diagnosis: Acute pain of right shoulder  Stiffness of right shoulder, not elsewhere classified  Muscle weakness (generalized)  Pain in left elbow  Abnormal posture     Problem List Patient Active Problem List   Diagnosis Date Noted   CAD (coronary artery disease) 01/07/2021   Chronic right shoulder pain 07/17/2020   Left tennis elbow 07/17/2020   Carpal tunnel syndrome on both sides 07/17/2020   Atypical chest pain 10/18/2019   Hemoptysis 10/18/2012   Preventative health care 04/07/2011   NUMBNESS, HAND 12/27/2007   HYPERLIPIDEMIA 12/24/2006   ALLERGIC RHINITIS 12/24/2006   GERD 12/24/2006   TB SKIN TEST, POSITIVE 12/24/2006    Madelyn Flavors, PT 01/24/2021, 9:01 AM  [facility] [address]  [place], [place], [address] Phone: [phone]   Fax:  [phone]  Name: Cristian Fuller MRN: [id number] Date of Birth: 05/09/1960

## 2021-01-29 ENCOUNTER — Ambulatory Visit: Payer: Medicaid Other

## 2021-01-29 ENCOUNTER — Other Ambulatory Visit: Payer: Self-pay

## 2021-01-29 DIAGNOSIS — M25522 Pain in left elbow: Secondary | ICD-10-CM

## 2021-01-29 DIAGNOSIS — M6281 Muscle weakness (generalized): Secondary | ICD-10-CM

## 2021-01-29 DIAGNOSIS — M25511 Pain in right shoulder: Secondary | ICD-10-CM | POA: Diagnosis not present

## 2021-01-29 DIAGNOSIS — M25611 Stiffness of right shoulder, not elsewhere classified: Secondary | ICD-10-CM

## 2021-01-29 DIAGNOSIS — R293 Abnormal posture: Secondary | ICD-10-CM

## 2021-01-29 NOTE — Therapy (Signed)
[facility] [address]  [place], [place], [address] Phone: [phone]   Fax:  [phone]  Physical Therapy Treatment  Patient Details  Name: Cristian Fuller MRN: [id number] Date of Birth: February 12, 1960 Referring Provider (PT): Ophelia Charter   Encounter Date: 01/29/2021   PT End of Session - 01/29/21 1101     Visit Number 9    Number of Visits 24    Date for PT Re-Evaluation 02/06/21    Authorization Type [employer]    PT Start Time 7412    PT Stop Time 1059    PT Time Calculation (min) 44 min    Activity Tolerance Patient tolerated treatment well;Patient limited by pain    Behavior During Therapy [facility] for tasks assessed/performed             Past Medical History:  Diagnosis Date   Allergic rhinitis    Hyperlipidemia    Left varicocele    history of left varicocele    Past Surgical History:  Procedure Laterality Date   BACK SURGERY     ESOPHAGOGASTRODUODENOSCOPY  2002/2007   LEFT HEART CATH AND CORONARY ANGIOGRAPHY N/A 10/24/2019   Procedure: LEFT HEART CATH AND CORONARY ANGIOGRAPHY;  Surgeon: Lorretta Harp, MD;  Location: [place] [facility];  Service: Cardiovascular;  Laterality: N/A;   LEFT HEART CATH AND CORONARY ANGIOGRAPHY N/A 01/07/2021   Procedure: LEFT HEART CATH AND CORONARY ANGIOGRAPHY;  Surgeon: Lorretta Harp, MD;  Location: [place] [facility];  Service: Cardiovascular;  Laterality: N/A;   SHOULDER ARTHROSCOPY WITH SUBACROMIAL DECOMPRESSION AND BICEP TENDON REPAIR Right 12/19/2020   Procedure: SHOULDER ARTHROSCOPY WITH SUBACROMIAL DECOMPRESSION AND BICEP TENDON REPAIR AND DISTAL CLAVICLE EXCISION;  Surgeon: Hiram Gash, MD;  Location: [place];  Service: Orthopedics;  Laterality: Right;    There were no vitals filed for this visit.   Subjective Assessment - 01/29/21 1016     Subjective Pt reports that he has been improving, has appointment with Dr. Griffin Basil  today.   Currently in Pain? Yes    Pain Score 3     Pain Location Shoulder    Pain Orientation Right;Mid    Pain Descriptors / Indicators Aching    Pain Type Acute pain;Surgical pain                               [facility] Adult PT Treatment/Exercise - 01/29/21 0001       Shoulder Exercises: Seated   Horizontal ABduction Strengthening;Both;10 reps;Theraband    Theraband Level (Shoulder Horizontal ABduction) Level 2 (Red)    Flexion AROM;Both;10 reps    Abduction AROM;Both;10 reps      Shoulder Exercises: Standing   External Rotation Strengthening;Right;10 reps;Theraband    Theraband Level (Shoulder External Rotation) Level 2 (Red)    Internal Rotation Strengthening;Right;10 reps;Theraband    Theraband Level (Shoulder Internal Rotation) Level 2 (Red)    Extension Strengthening;Both;10 reps;Theraband    Theraband Level (Shoulder Extension) Level 2 (Red)    Extension Limitations to midline    Row Strengthening;Both;10 reps;Theraband    Theraband Level (Shoulder Row) Level 2 (Red)    Row Limitations to midline      Shoulder Exercises: Pulleys   Flexion 3 minutes    Scaption 3 minutes      Shoulder Exercises: ROM/Strengthening   Ball on Wall R/L 40 sec each with CCW circles  Other ROM/Strengthening Exercises B standing ER with yellow TB 10x    Other ROM/Strengthening Exercises cabinet reaches 3x10 reaching up and across, last set progressed with 1lb weight                       PT Short Term Goals - 01/24/21 0846       PT SHORT TERM GOAL #1   Title Patient to be independent with initial HEP.    Status Achieved      PT SHORT TERM GOAL #2   Title Patient will demonstrate improved R shoulder ROM to 140 deg foward flexion without pain.    Status Achieved               PT Long Term Goals - 01/03/21 1603       PT LONG TERM GOAL #1   Title Patient to be independent with advanced HEP to progress to gym based exercise program.    Time  12    Period Weeks    Status On-going      PT LONG TERM GOAL #2   Title Patient to demonstrate R UE strength >/=4+/5  without pain to be able to start gentle resistance training    Time 8    Period Weeks    Status On-going      PT LONG TERM GOAL #3   Title Patient to demonstrate R shoulder AROM WFL and without pain limiting.    Time 12    Period Weeks    Status On-going      PT LONG TERM GOAL #4   Title Patient will report >48 on FOTO to demonstrate improved function of R shoulder    Baseline 4    Time 12    Period Weeks    Status On-going                   Plan - 01/29/21 1102     Clinical Impression Statement Pt was able to progress through the exercises today but with intermittent reports of pain. He had most pain with fwd flexion adding resistance so we continued exercises with no resistance. His most complaints were with reaching across his body and behind his back so we focused on his functional limitations.    Personal Factors and Comorbidities Comorbidity 2;Social Background    Comorbidities history chronic L shoulder pain, history of heart disease    PT Frequency 2x / week    PT Duration 12 weeks    PT Treatment/Interventions ADLs/Self Care Home Management;Cryotherapy;Electrical Stimulation;Moist Heat;Therapeutic activities;Therapeutic exercise;Neuromuscular re-education;Patient/family education;Scar mobilization;Passive range of motion;Dry needling;Taping;Vasopneumatic Device;Joint Manipulations    PT Next Visit Plan review and progress HEP per surgical protocol.  Surgery date 12/19/20, phase 2 to 02/06/21. MD 01/29/21 (MD note sent 12/22)    PT Home Exercise Plan Access Code: [email or web address]    Consulted and Agree with Plan of Care Patient             Patient will benefit from skilled therapeutic intervention in order to improve the following deficits and impairments:  Decreased endurance, Decreased skin integrity, Increased muscle spasms, Decreased range of  motion, Decreased scar mobility, Decreased activity tolerance, Decreased strength, Increased fascial restricitons, Impaired flexibility, Impaired UE functional use, Pain  Visit Diagnosis: Acute pain of right shoulder  Stiffness of right shoulder, not elsewhere classified  Muscle weakness (generalized)  Pain in left elbow  Abnormal posture     Problem List Patient Active Problem List  Diagnosis Date Noted   CAD (coronary artery disease) 01/07/2021   Chronic right shoulder pain 07/17/2020   Left tennis elbow 07/17/2020   Carpal tunnel syndrome on both sides 07/17/2020   Atypical chest pain 10/18/2019   Hemoptysis 10/18/2012   Preventative health care 04/07/2011   NUMBNESS, HAND 12/27/2007   HYPERLIPIDEMIA 12/24/2006   ALLERGIC RHINITIS 12/24/2006   GERD 12/24/2006   TB SKIN TEST, POSITIVE 12/24/2006    Artist Pais, PTA 01/29/2021, 11:59 AM  [facility] [address]  [place], [place], [address] Phone: [phone]   Fax:  [phone]  Name: Cristian Fuller MRN: [id number] Date of Birth: 08-Oct-1960

## 2021-02-01 ENCOUNTER — Ambulatory Visit: Payer: Medicaid Other

## 2021-02-01 ENCOUNTER — Other Ambulatory Visit: Payer: Self-pay

## 2021-02-01 DIAGNOSIS — M25611 Stiffness of right shoulder, not elsewhere classified: Secondary | ICD-10-CM

## 2021-02-01 DIAGNOSIS — M25511 Pain in right shoulder: Secondary | ICD-10-CM

## 2021-02-01 DIAGNOSIS — M25522 Pain in left elbow: Secondary | ICD-10-CM

## 2021-02-01 DIAGNOSIS — M6281 Muscle weakness (generalized): Secondary | ICD-10-CM

## 2021-02-01 DIAGNOSIS — R293 Abnormal posture: Secondary | ICD-10-CM

## 2021-02-01 NOTE — Therapy (Signed)
[facility] [address]  [place], [place], [address] Phone: [phone]   Fax:  [phone]  Physical Therapy Treatment  Patient Details  Name: Cristian Fuller MRN: [id number] Date of Birth: 1960/07/16 Referring Provider (PT): Ophelia Charter   Encounter Date: 02/01/2021   PT End of Session - 02/01/21 1000     Visit Number 10    Number of Visits 24    Date for PT Re-Evaluation 02/06/21    Authorization Type [employer]    PT Start Time 3491    PT Stop Time 0931    PT Time Calculation (min) 41 min    Activity Tolerance Patient tolerated treatment well    Behavior During Therapy [facility] for tasks assessed/performed             Past Medical History:  Diagnosis Date   Allergic rhinitis    Hyperlipidemia    Left varicocele    history of left varicocele    Past Surgical History:  Procedure Laterality Date   BACK SURGERY     ESOPHAGOGASTRODUODENOSCOPY  2002/2007   LEFT HEART CATH AND CORONARY ANGIOGRAPHY N/A 10/24/2019   Procedure: LEFT HEART CATH AND CORONARY ANGIOGRAPHY;  Surgeon: Lorretta Harp, MD;  Location: [place] [facility];  Service: Cardiovascular;  Laterality: N/A;   LEFT HEART CATH AND CORONARY ANGIOGRAPHY N/A 01/07/2021   Procedure: LEFT HEART CATH AND CORONARY ANGIOGRAPHY;  Surgeon: Lorretta Harp, MD;  Location: [place] [facility];  Service: Cardiovascular;  Laterality: N/A;   SHOULDER ARTHROSCOPY WITH SUBACROMIAL DECOMPRESSION AND BICEP TENDON REPAIR Right 12/19/2020   Procedure: SHOULDER ARTHROSCOPY WITH SUBACROMIAL DECOMPRESSION AND BICEP TENDON REPAIR AND DISTAL CLAVICLE EXCISION;  Surgeon: Hiram Gash, MD;  Location: [place];  Service: Orthopedics;  Laterality: Right;    There were no vitals filed for this visit.   Subjective Assessment - 02/01/21 0851     Subjective "A little sore today, he is making a pulley system at home."    Currently in Pain? Yes     Pain Score 3     Pain Location Shoulder    Pain Orientation Right;Mid    Pain Descriptors / Indicators Aching    Pain Type Acute pain;Surgical pain                [facility] PT Assessment - 02/01/21 0001       AROM   Right Shoulder Flexion 158 Degrees    Right Shoulder ABduction 141 Degrees                           [facility] Adult PT Treatment/Exercise - 02/01/21 0001       Shoulder Exercises: Supine   Protraction Strengthening;Right;20 reps;Weights    Protraction Weight (lbs) 2      Shoulder Exercises: Prone   Extension AROM;Right;10 reps    Extension Limitations to midline    Other Prone Exercises rows with 2lb weight 10 reps   to midline     Shoulder Exercises: Standing   External Rotation Strengthening;10 reps;Theraband;Both    Theraband Level (Shoulder External Rotation) Level 2 (Red)    Extension Strengthening;Both;20 reps;Theraband    Theraband Level (Shoulder Extension) Level 2 (Red)    Extension Limitations to midline    Row Strengthening;Both;20 reps;Theraband    Theraband Level (Shoulder Row) Level 2 (Red)    Row Limitations to midline      Shoulder  Exercises: Pulleys   Flexion 3 minutes    Scaption 3 minutes      Manual Therapy   Manual Therapy Soft tissue mobilization    Soft tissue mobilization STM to ant deltoid, pecs, proximal biceps                       PT Short Term Goals - 01/24/21 0846       PT SHORT TERM GOAL #1   Title Patient to be independent with initial HEP.    Status Achieved      PT SHORT TERM GOAL #2   Title Patient will demonstrate improved R shoulder ROM to 140 deg foward flexion without pain.    Status Achieved               PT Long Term Goals - 02/01/21 0933       PT LONG TERM GOAL #1   Title Patient to be independent with advanced HEP to progress to gym based exercise program.    Time 12    Period Weeks    Status On-going      PT LONG TERM GOAL #2   Title Patient to demonstrate R  UE strength >/=4+/5  without pain to be able to start gentle resistance training    Time 8    Period Weeks    Status On-going      PT LONG TERM GOAL #3   Title Patient to demonstrate R shoulder AROM WFL and without pain limiting.    Time 12    Period Weeks    Status Partially Met   met for flex and abd     PT LONG TERM GOAL #4   Title Patient will report >48 on FOTO to demonstrate improved function of R shoulder    Baseline 4    Time 12    Period Weeks    Status On-going                   Plan - 02/01/21 0959     Clinical Impression Statement Pt able to complete all interventions today. He has c/o ant shoulder discomfort which we address with STM and I showed some desensitization techniques to him. ROM looks good with flex and abd, IR/ER still functionallly limited. Tactile cues needed to ensure good periscap contraction in prone, good response to treatment.    Personal Factors and Comorbidities Comorbidity 2;Social Background    Comorbidities history chronic L shoulder pain, history of heart disease    PT Frequency 2x / week    PT Duration 12 weeks    PT Treatment/Interventions ADLs/Self Care Home Management;Cryotherapy;Electrical Stimulation;Moist Heat;Therapeutic activities;Therapeutic exercise;Neuromuscular re-education;Patient/family education;Scar mobilization;Passive range of motion;Dry needling;Taping;Vasopneumatic Device;Joint Manipulations    PT Next Visit Plan review and progress HEP per surgical protocol.  Surgery date 12/19/20, phase 2 to 02/06/21. MD 01/29/21 (MD note sent 12/22)    PT Home Exercise Plan Access Code: [email or web address]    Consulted and Agree with Plan of Care Patient             Patient will benefit from skilled therapeutic intervention in order to improve the following deficits and impairments:  Decreased endurance, Decreased skin integrity, Increased muscle spasms, Decreased range of motion, Decreased scar mobility, Decreased activity tolerance,  Decreased strength, Increased fascial restricitons, Impaired flexibility, Impaired UE functional use, Pain  Visit Diagnosis: Acute pain of right shoulder  Stiffness of right shoulder, not elsewhere classified  Muscle weakness (generalized)  Pain in left elbow  Abnormal posture     Problem List Patient Active Problem List   Diagnosis Date Noted   CAD (coronary artery disease) 01/07/2021   Chronic right shoulder pain 07/17/2020   Left tennis elbow 07/17/2020   Carpal tunnel syndrome on both sides 07/17/2020   Atypical chest pain 10/18/2019   Hemoptysis 10/18/2012   Preventative health care 04/07/2011   NUMBNESS, HAND 12/27/2007   HYPERLIPIDEMIA 12/24/2006   ALLERGIC RHINITIS 12/24/2006   GERD 12/24/2006   TB SKIN TEST, POSITIVE 12/24/2006    Artist Pais, PTA 02/01/2021, 10:53 AM  [facility] [address]  [place], [place], [address] Phone: [phone]   Fax:  [phone]  Name: Cristian Fuller MRN: [id number] Date of Birth: 05/21/1960

## 2021-02-05 ENCOUNTER — Ambulatory Visit: Payer: Medicaid Other | Attending: Orthopaedic Surgery | Admitting: Physical Therapy

## 2021-02-05 ENCOUNTER — Encounter: Payer: Self-pay | Admitting: Physical Therapy

## 2021-02-05 ENCOUNTER — Other Ambulatory Visit: Payer: Self-pay

## 2021-02-05 DIAGNOSIS — M25611 Stiffness of right shoulder, not elsewhere classified: Secondary | ICD-10-CM | POA: Diagnosis present

## 2021-02-05 DIAGNOSIS — M6281 Muscle weakness (generalized): Secondary | ICD-10-CM | POA: Insufficient documentation

## 2021-02-05 DIAGNOSIS — M25522 Pain in left elbow: Secondary | ICD-10-CM | POA: Diagnosis present

## 2021-02-05 DIAGNOSIS — M25511 Pain in right shoulder: Secondary | ICD-10-CM | POA: Diagnosis present

## 2021-02-05 DIAGNOSIS — R293 Abnormal posture: Secondary | ICD-10-CM | POA: Insufficient documentation

## 2021-02-05 NOTE — Therapy (Signed)
[facility] [address]  [place], [place], [address] Phone: [phone]   Fax:  [phone]  Physical Therapy Treatment Progress Note Reporting Period 12/26/2020  to 02/05/2021  See note below for Objective Data and Assessment of Progress/Goals.      Patient Details  Name: Cristian Fuller MRN: [id number] Date of Birth: 1961-01-24 Referring Provider (PT): Ophelia Charter   Encounter Date: 02/05/2021   PT End of Session - 02/05/21 0806     Visit Number 11    Number of Visits 24    Date for PT Re-Evaluation 02/06/21    Authorization Type [employer]    PT Start Time 0803    PT Stop Time 2637    PT Time Calculation (min) 52 min    Activity Tolerance Patient tolerated treatment well    Behavior During Therapy [facility] for tasks assessed/performed             Past Medical History:  Diagnosis Date   Allergic rhinitis    Hyperlipidemia    Left varicocele    history of left varicocele    Past Surgical History:  Procedure Laterality Date   BACK SURGERY     ESOPHAGOGASTRODUODENOSCOPY  2002/2007   LEFT HEART CATH AND CORONARY ANGIOGRAPHY N/A 10/24/2019   Procedure: LEFT HEART CATH AND CORONARY ANGIOGRAPHY;  Surgeon: Lorretta Harp, MD;  Location: [facility];  Service: Cardiovascular;  Laterality: N/A;   LEFT HEART CATH AND CORONARY ANGIOGRAPHY N/A 01/07/2021   Procedure: LEFT HEART CATH AND CORONARY ANGIOGRAPHY;  Surgeon: Lorretta Harp, MD;  Location: [facility];  Service: Cardiovascular;  Laterality: N/A;   SHOULDER ARTHROSCOPY WITH SUBACROMIAL DECOMPRESSION AND BICEP TENDON REPAIR Right 12/19/2020   Procedure: SHOULDER ARTHROSCOPY WITH SUBACROMIAL DECOMPRESSION AND BICEP TENDON REPAIR AND DISTAL CLAVICLE EXCISION;  Surgeon: Hiram Gash, MD;  Location: [facility];  Service: Orthopedics;  Laterality: Right;    There were no vitals filed for this visit.   Subjective  Assessment - 02/05/21 0806     Subjective Reports shoulder is about the same.    Currently in Pain? Yes    Pain Score 2     Pain Location Shoulder    Pain Orientation Right                [facility] PT Assessment - 02/05/21 0001       Assessment   Medical Diagnosis M75.41 (ICD-10-CM) - Impingement syndrome of right shoulder  S42.031A (ICD-10-CM) - Displaced fracture of lateral end of right clavicle  M75.21 (ICD-10-CM) - Biceps tendonitis on right    Referring Provider (PT) Griffin Basil, Dax    Onset Date/Surgical Date 12/19/20    Hand Dominance Right      Precautions   Precautions Shoulder    Type of Shoulder Precautions see protocol      Restrictions   Weight Bearing Restrictions No      Observation/Other Assessments   Focus on Therapeutic Outcomes (FOTO)  shoulder: 55      AROM   Right Shoulder Flexion 155 Degrees    Right Shoulder ABduction 110 Degrees   pain endrange   Right Shoulder Internal Rotation --   functional to waist band   Right Shoulder External Rotation --   functional to T1     PROM   Right Shoulder Flexion 175 Degrees   pain at end range.   Right Shoulder ABduction 158 Degrees    Right  Shoulder Internal Rotation 60 Degrees    Right Shoulder External Rotation 70 Degrees      Strength   Overall Strength Deficits    Overall Strength Comments 4/5 R shoulder strength (very gentle resistance applied) reported increased pain with resistance.                           [facility] Adult PT Treatment/Exercise - 02/05/21 0001       Shoulder Exercises: Prone   Flexion Strengthening;Right;10 reps    Extension Right;10 reps;Strengthening    Extension Limitations to midline    Horizontal ABduction 1 Strengthening;Right;10 reps    Other Prone Exercises rows with 2lb weight 2 x 10 reps      Shoulder Exercises: Sidelying   External Rotation Strengthening;Right;Weights;20 reps    External Rotation Weight (lbs) 1   for first 5 but fatigued quickly, d/c  weight   External Rotation Limitations towel under shoulder      Shoulder Exercises: Pulleys   Flexion 3 minutes    Scaption 3 minutes      Shoulder Exercises: ROM/Strengthening   Rhythmic Stabilization, Supine 3 x 1 min      Modalities   Modalities Moist Heat      Moist Heat Therapy   Number Minutes Moist Heat 8 Minutes    Moist Heat Location Shoulder      Manual Therapy   Manual Therapy Soft tissue mobilization    Manual therapy comments measurements taken    Soft tissue mobilization STM to ant deltoid, scar tissue mobilization to port incisions                       PT Short Term Goals - 01/24/21 0846       PT SHORT TERM GOAL #1   Title Patient to be independent with initial HEP.    Status Achieved      PT SHORT TERM GOAL #2   Title Patient will demonstrate improved R shoulder ROM to 140 deg foward flexion without pain.    Status Achieved               PT Long Term Goals - 02/05/21 0818       PT LONG TERM GOAL #1   Title Patient to be independent with advanced HEP to progress to gym based exercise program.    Time 12    Period Weeks    Status On-going    Target Date 03/20/21      PT LONG TERM GOAL #2   Title Patient to demonstrate R UE strength >/=4+/5  without pain to be able to start gentle resistance training    Time 8    Period Weeks    Status On-going   02/05/2021- 4/5 R shoulder strength, still painful with gentle resistance.   Target Date 03/19/21      PT LONG TERM GOAL #3   Title Patient to demonstrate R shoulder AROM WFL and without pain limiting.    Time 12    Period Weeks    Status Partially Met   02/06/20- met for flx and abduction, but still has pain at endrange.     PT LONG TERM GOAL #4   Title Patient will report >48 on FOTO to demonstrate improved function of R shoulder    Baseline 4    Time 12    Period Weeks    Status Achieved   02/05/2021- 55%  Plan - 02/05/21 1011     Clinical Impression  Statement Pt. is making good progress, demonstrated good AROM in R shoulder, but still reports pain at end range.  Strength is also improving.  Today progressed exercises to include more prone exercises which he tolerated well.  He would benefit from continued skilled therapy 2x/week for additional 6 weeks to allow full healing and recovery in order to be able to return to work activities without limitation from pain.    Personal Factors and Comorbidities Comorbidity 2;Social Background    Comorbidities history chronic L shoulder pain, history of heart disease    PT Frequency 2x / week    PT Duration 12 weeks    PT Treatment/Interventions ADLs/Self Care Home Management;Cryotherapy;Electrical Stimulation;Moist Heat;Therapeutic activities;Therapeutic exercise;Neuromuscular re-education;Patient/family education;Scar mobilization;Passive range of motion;Dry needling;Taping;Vasopneumatic Device;Joint Manipulations    PT Next Visit Plan review and progress HEP per surgical protocol.  Surgery date 12/19/20, phase 3 to 02/13/21. (MD note sent 12/22)    PT Home Exercise Plan Access Code: [email or web address]    Consulted and Agree with Plan of Care Patient             Patient will benefit from skilled therapeutic intervention in order to improve the following deficits and impairments:  Decreased endurance, Decreased skin integrity, Increased muscle spasms, Decreased range of motion, Decreased scar mobility, Decreased activity tolerance, Decreased strength, Increased fascial restricitons, Impaired flexibility, Impaired UE functional use, Pain  Visit Diagnosis: Acute pain of right shoulder  Stiffness of right shoulder, not elsewhere classified  Muscle weakness (generalized)     Problem List Patient Active Problem List   Diagnosis Date Noted   CAD (coronary artery disease) 01/07/2021   Chronic right shoulder pain 07/17/2020   Left tennis elbow 07/17/2020   Carpal tunnel syndrome on both sides 07/17/2020    Atypical chest pain 10/18/2019   Hemoptysis 10/18/2012   Preventative health care 04/07/2011   NUMBNESS, HAND 12/27/2007   HYPERLIPIDEMIA 12/24/2006   ALLERGIC RHINITIS 12/24/2006   GERD 12/24/2006   TB SKIN TEST, POSITIVE 12/24/2006    Rennie Natter, PT, DPT  02/05/2021, 12:26 PM  [facility] [address]  [place], [place], [address] Phone: [phone]   Fax:  [phone]  Name: Cristian Fuller MRN: [id number] Date of Birth: 11-07-1960

## 2021-02-08 ENCOUNTER — Ambulatory Visit: Payer: Medicaid Other

## 2021-02-08 ENCOUNTER — Other Ambulatory Visit: Payer: Self-pay

## 2021-02-08 DIAGNOSIS — M25511 Pain in right shoulder: Secondary | ICD-10-CM | POA: Diagnosis not present

## 2021-02-08 DIAGNOSIS — M6281 Muscle weakness (generalized): Secondary | ICD-10-CM

## 2021-02-08 DIAGNOSIS — M25611 Stiffness of right shoulder, not elsewhere classified: Secondary | ICD-10-CM

## 2021-02-08 NOTE — Therapy (Signed)
[facility] [address]  [place], [place], [address] Phone: [phone]   Fax:  [phone]  Physical Therapy Treatment  Patient Details  Name: Cristian Fuller MRN: [id number] Date of Birth: 07-17-1960 Referring Provider (PT): Ophelia Charter   Encounter Date: 02/08/2021   PT End of Session - 02/08/21 0905     Visit Number 12    Number of Visits 24    Date for PT Re-Evaluation 03/19/21    Authorization Type [employer]    Authorization Time Period 02/01/21-03/20/21    Authorization - Number of Visits 6    PT Start Time 0801    PT Stop Time 0903    PT Time Calculation (min) 62 min    Activity Tolerance Patient tolerated treatment well    Behavior During Therapy [facility] for tasks assessed/performed             Past Medical History:  Diagnosis Date   Allergic rhinitis    Hyperlipidemia    Left varicocele    history of left varicocele    Past Surgical History:  Procedure Laterality Date   BACK SURGERY     ESOPHAGOGASTRODUODENOSCOPY  2002/2007   LEFT HEART CATH AND CORONARY ANGIOGRAPHY N/A 10/24/2019   Procedure: LEFT HEART CATH AND CORONARY ANGIOGRAPHY;  Surgeon: Lorretta Harp, MD;  Location: [facility];  Service: Cardiovascular;  Laterality: N/A;   LEFT HEART CATH AND CORONARY ANGIOGRAPHY N/A 01/07/2021   Procedure: LEFT HEART CATH AND CORONARY ANGIOGRAPHY;  Surgeon: Lorretta Harp, MD;  Location: [facility];  Service: Cardiovascular;  Laterality: N/A;   SHOULDER ARTHROSCOPY WITH SUBACROMIAL DECOMPRESSION AND BICEP TENDON REPAIR Right 12/19/2020   Procedure: SHOULDER ARTHROSCOPY WITH SUBACROMIAL DECOMPRESSION AND BICEP TENDON REPAIR AND DISTAL CLAVICLE EXCISION;  Surgeon: Hiram Gash, MD;  Location: [facility];  Service: Orthopedics;  Laterality: Right;    There were no vitals filed for this visit.   Subjective Assessment - 02/08/21 0803     Subjective Pt reports  that his shoulder is still doing the same.    Currently in Pain? Yes    Pain Score 2     Pain Location Shoulder    Pain Orientation Right    Pain Descriptors / Indicators Aching    Pain Type Acute pain;Surgical pain                               [facility] Adult PT Treatment/Exercise - 02/08/21 0001       Shoulder Exercises: Prone   Flexion Strengthening;Right;15 reps    Extension Strengthening;Right;15 reps    Extension Limitations to midline    External Rotation Strengthening;Right;5 reps      Shoulder Exercises: Sidelying   External Rotation Strengthening;Right;10 reps    External Rotation Weight (lbs) 1    External Rotation Limitations tactile cues to keep elbow down    ABduction AROM;Right;10 reps    ABduction Limitations focus on eccentric lowering      Shoulder Exercises: Standing   Protraction Strengthening;Both;Theraband    Theraband Level (Shoulder Protraction) Level 2 (Red)    Protraction Limitations 8 reps      Shoulder Exercises: Pulleys   Flexion 3 minutes    Scaption 3 minutes      Shoulder Exercises: ROM/Strengthening   Other ROM/Strengthening Exercises R UE cabinet reaches up and across 20x with focus on eccentric lowering  Modalities   Modalities Moist Heat      Moist Heat Therapy   Number Minutes Moist Heat 15 Minutes    Moist Heat Location Shoulder      Manual Therapy   Manual Therapy Soft tissue mobilization    Soft tissue mobilization STM to R UT, LS, rhomboids, lower trap                       PT Short Term Goals - 01/24/21 0846       PT SHORT TERM GOAL #1   Title Patient to be independent with initial HEP.    Status Achieved      PT SHORT TERM GOAL #2   Title Patient will demonstrate improved R shoulder ROM to 140 deg foward flexion without pain.    Status Achieved               PT Long Term Goals - 02/05/21 0818       PT LONG TERM GOAL #1   Title Patient to be independent with advanced  HEP to progress to gym based exercise program.    Time 12    Period Weeks    Status On-going    Target Date 03/20/21      PT LONG TERM GOAL #2   Title Patient to demonstrate R UE strength >/=4+/5  without pain to be able to start gentle resistance training    Time 8    Period Weeks    Status On-going   02/05/2021- 4/5 R shoulder strength, still painful with gentle resistance.   Target Date 03/19/21      PT LONG TERM GOAL #3   Title Patient to demonstrate R shoulder AROM WFL and without pain limiting.    Time 12    Period Weeks    Status Partially Met   02/06/20- met for flx and abduction, but still has pain at endrange.     PT LONG TERM GOAL #4   Title Patient will report >48 on FOTO to demonstrate improved function of R shoulder    Baseline 4    Time 12    Period Weeks    Status Achieved   02/05/2021- 55%                  Plan - 02/08/21 0929     Clinical Impression Statement Pt tolerated the exercises well today but notes fatigue in the shoulder afterwards. Progressed scap stab exercises to his tolerance and provided some TC for targeting the RTC in sidelying. Found tightness and TTP along the posterior shoulder and introduced dry needling to him. Ended session with MHP to decrease muscle tightness and pain.    Personal Factors and Comorbidities Comorbidity 2;Social Background    Comorbidities history chronic L shoulder pain, history of heart disease    PT Frequency 2x / week    PT Duration 12 weeks    PT Treatment/Interventions ADLs/Self Care Home Management;Cryotherapy;Electrical Stimulation;Moist Heat;Therapeutic activities;Therapeutic exercise;Neuromuscular re-education;Patient/family education;Scar mobilization;Passive range of motion;Dry needling;Taping;Vasopneumatic Device;Joint Manipulations    PT Next Visit Plan review and progress HEP per surgical protocol.  Surgery date 12/19/20, phase 3 to 02/13/21. (MD note sent 12/22)    PT Home Exercise Plan Access Code:  [email or web address]    Consulted and Agree with Plan of Care Patient             Patient will benefit from skilled therapeutic intervention in order to improve the following deficits and impairments:  Decreased endurance, Decreased skin integrity, Increased muscle spasms, Decreased range of motion, Decreased scar mobility, Decreased activity tolerance, Decreased strength, Increased fascial restricitons, Impaired flexibility, Impaired UE functional use, Pain  Visit Diagnosis: Acute pain of right shoulder  Stiffness of right shoulder, not elsewhere classified  Muscle weakness (generalized)     Problem List Patient Active Problem List   Diagnosis Date Noted   CAD (coronary artery disease) 01/07/2021   Chronic right shoulder pain 07/17/2020   Left tennis elbow 07/17/2020   Carpal tunnel syndrome on both sides 07/17/2020   Atypical chest pain 10/18/2019   Hemoptysis 10/18/2012   Preventative health care 04/07/2011   NUMBNESS, HAND 12/27/2007   HYPERLIPIDEMIA 12/24/2006   ALLERGIC RHINITIS 12/24/2006   GERD 12/24/2006   TB SKIN TEST, POSITIVE 12/24/2006    Artist Pais, PTA 02/08/2021, 9:51 AM  [facility] [address]  [place], [place], [address] Phone: [phone]   Fax:  [phone]  Name: Casy Brunetto MRN: [id number] Date of Birth: 12-16-1960

## 2021-02-12 ENCOUNTER — Encounter: Payer: Self-pay | Admitting: Physical Therapy

## 2021-02-12 ENCOUNTER — Ambulatory Visit: Payer: Medicaid Other | Admitting: Physical Therapy

## 2021-02-12 ENCOUNTER — Other Ambulatory Visit: Payer: Self-pay

## 2021-02-12 DIAGNOSIS — M6281 Muscle weakness (generalized): Secondary | ICD-10-CM

## 2021-02-12 DIAGNOSIS — R293 Abnormal posture: Secondary | ICD-10-CM

## 2021-02-12 DIAGNOSIS — M25511 Pain in right shoulder: Secondary | ICD-10-CM | POA: Diagnosis not present

## 2021-02-12 DIAGNOSIS — M25611 Stiffness of right shoulder, not elsewhere classified: Secondary | ICD-10-CM

## 2021-02-12 NOTE — Therapy (Signed)
[facility] [address]  [place], [place], [address] Phone: [phone]   Fax:  [phone]  Physical Therapy Treatment  Patient Details  Name: Cristian Fuller MRN: [id number] Date of Birth: 1960-10-26 Referring Provider (PT): Ophelia Charter   Encounter Date: 02/12/2021   PT End of Session - 02/12/21 0806     Visit Number 13    Number of Visits 24    Date for PT Re-Evaluation 03/19/21    Authorization Type [employer]    Authorization Time Period 02/01/21-03/20/21    Authorization - Number of Visits 6    PT Start Time 0803    PT Stop Time 0852    PT Time Calculation (min) 49 min    Activity Tolerance Patient tolerated treatment well    Behavior During Therapy [facility] for tasks assessed/performed             Past Medical History:  Diagnosis Date   Allergic rhinitis    Hyperlipidemia    Left varicocele    history of left varicocele    Past Surgical History:  Procedure Laterality Date   BACK SURGERY     ESOPHAGOGASTRODUODENOSCOPY  2002/2007   LEFT HEART CATH AND CORONARY ANGIOGRAPHY N/A 10/24/2019   Procedure: LEFT HEART CATH AND CORONARY ANGIOGRAPHY;  Surgeon: Lorretta Harp, MD;  Location: [facility];  Service: Cardiovascular;  Laterality: N/A;   LEFT HEART CATH AND CORONARY ANGIOGRAPHY N/A 01/07/2021   Procedure: LEFT HEART CATH AND CORONARY ANGIOGRAPHY;  Surgeon: Lorretta Harp, MD;  Location: [facility];  Service: Cardiovascular;  Laterality: N/A;   SHOULDER ARTHROSCOPY WITH SUBACROMIAL DECOMPRESSION AND BICEP TENDON REPAIR Right 12/19/2020   Procedure: SHOULDER ARTHROSCOPY WITH SUBACROMIAL DECOMPRESSION AND BICEP TENDON REPAIR AND DISTAL CLAVICLE EXCISION;  Surgeon: Hiram Gash, MD;  Location: [facility];  Service: Orthopedics;  Laterality: Right;    There were no vitals filed for this visit.   Subjective Assessment - 02/12/21 0805     Subjective Pt reports  that his shoulder is still doing the same.    Currently in Pain? Yes    Pain Score 2     Pain Location Shoulder    Pain Orientation Right                               [facility] Adult PT Treatment/Exercise - 02/12/21 0001       Shoulder Exercises: Supine   Protraction Strengthening;Right;20 reps;Weights    Protraction Weight (lbs) 1      Shoulder Exercises: Prone   Retraction Strengthening;Right;20 reps    Retraction Limitations 1 set with 1# weight    Flexion Strengthening;Right;15 reps    Extension Strengthening;Right;20 reps    Extension Limitations to midline    Horizontal ABduction 1 Strengthening;Right;20 reps    Horizontal ABduction 1 Limitations scaption      Shoulder Exercises: Sidelying   External Rotation Strengthening;Right;20 reps    External Rotation Weight (lbs) 1    External Rotation Limitations tactile cues to keep elbow down    ABduction AROM;Right;20 reps    ABduction Limitations focus on eccentric lowering      Shoulder Exercises: Pulleys   Flexion 3 minutes    Scaption 3 minutes      Shoulder Exercises: ROM/Strengthening   Rhythmic Stabilization, Supine 3 x 30 sec      Modalities   Modalities Moist  Heat      Moist Heat Therapy   Number Minutes Moist Heat 10 Minutes    Moist Heat Location Shoulder      Manual Therapy   Manual Therapy Soft tissue mobilization;Joint mobilization    Manual therapy comments to R shoulder to decrease pain and improve ROM    Joint Mobilization scapular mobilization all directions    Soft tissue mobilization STM to R UT, IASTM with s/s tools to pec minor, supraspinatus, subscap release                       PT Short Term Goals - 01/24/21 0846       PT SHORT TERM GOAL #1   Title Patient to be independent with initial HEP.    Status Achieved      PT SHORT TERM GOAL #2   Title Patient will demonstrate improved R shoulder ROM to 140 deg foward flexion without pain.    Status Achieved                PT Long Term Goals - 02/05/21 0818       PT LONG TERM GOAL #1   Title Patient to be independent with advanced HEP to progress to gym based exercise program.    Time 12    Period Weeks    Status On-going    Target Date 03/20/21      PT LONG TERM GOAL #2   Title Patient to demonstrate R UE strength >/=4+/5  without pain to be able to start gentle resistance training    Time 8    Period Weeks    Status On-going   02/05/2021- 4/5 R shoulder strength, still painful with gentle resistance.   Target Date 03/19/21      PT LONG TERM GOAL #3   Title Patient to demonstrate R shoulder AROM WFL and without pain limiting.    Time 12    Period Weeks    Status Partially Met   02/06/20- met for flx and abduction, but still has pain at endrange.     PT LONG TERM GOAL #4   Title Patient will report >48 on FOTO to demonstrate improved function of R shoulder    Baseline 4    Time 12    Period Weeks    Status Achieved   02/05/2021- 55%                  Plan - 02/12/21 0844     Clinical Impression Statement Pt. tolerating exercises well but reporting some pain in posterior shoulder with rows with 1# weight, no pain without weight.  He needed cues throughout to rest between sets and communicate discomfort with exercises.  Manual therapy at end of session to address tightness in pectoralis and scapula.    Personal Factors and Comorbidities Comorbidity 2;Social Background    Comorbidities history chronic L shoulder pain, history of heart disease    PT Frequency 2x / week    PT Duration 12 weeks    PT Treatment/Interventions ADLs/Self Care Home Management;Cryotherapy;Electrical Stimulation;Moist Heat;Therapeutic activities;Therapeutic exercise;Neuromuscular re-education;Patient/family education;Scar mobilization;Passive range of motion;Dry needling;Taping;Vasopneumatic Device;Joint Manipulations    PT Next Visit Plan review and progress HEP per surgical protocol.  Surgery date  12/19/20, phase 3 to 02/13/21. (MD note sent 12/22)    PT Home Exercise Plan Access Code: [email or web address]    Consulted and Agree with Plan of Care Patient  Patient will benefit from skilled therapeutic intervention in order to improve the following deficits and impairments:  Decreased endurance, Decreased skin integrity, Increased muscle spasms, Decreased range of motion, Decreased scar mobility, Decreased activity tolerance, Decreased strength, Increased fascial restricitons, Impaired flexibility, Impaired UE functional use, Pain  Visit Diagnosis: Acute pain of right shoulder  Stiffness of right shoulder, not elsewhere classified  Muscle weakness (generalized)  Abnormal posture     Problem List Patient Active Problem List   Diagnosis Date Noted   CAD (coronary artery disease) 01/07/2021   Chronic right shoulder pain 07/17/2020   Left tennis elbow 07/17/2020   Carpal tunnel syndrome on both sides 07/17/2020   Atypical chest pain 10/18/2019   Hemoptysis 10/18/2012   Preventative health care 04/07/2011   NUMBNESS, HAND 12/27/2007   HYPERLIPIDEMIA 12/24/2006   ALLERGIC RHINITIS 12/24/2006   GERD 12/24/2006   TB SKIN TEST, POSITIVE 12/24/2006    Rennie Natter, PT, DPT  02/12/2021, 9:10 AM  [facility] [address]  [place], [place], [address] Phone: [phone]   Fax:  [phone]  Name: Cristian Fuller MRN: [id number] Date of Birth: 1960-03-05

## 2021-02-15 ENCOUNTER — Ambulatory Visit: Payer: Medicaid Other

## 2021-02-15 ENCOUNTER — Other Ambulatory Visit: Payer: Self-pay

## 2021-02-15 DIAGNOSIS — M25511 Pain in right shoulder: Secondary | ICD-10-CM

## 2021-02-15 DIAGNOSIS — M25611 Stiffness of right shoulder, not elsewhere classified: Secondary | ICD-10-CM

## 2021-02-15 DIAGNOSIS — M6281 Muscle weakness (generalized): Secondary | ICD-10-CM

## 2021-02-15 NOTE — Therapy (Signed)
[facility] [address]  [place], [place], [address] Phone: [phone]   Fax:  [phone]  Physical Therapy Treatment  Patient Details  Name: Cristian Fuller MRN: [id number] Date of Birth: 31-Jul-1960 Referring Provider (PT): Ophelia Charter   Encounter Date: 02/15/2021   PT End of Session - 02/15/21 0901     Visit Number 14    Number of Visits 24    Date for PT Re-Evaluation 03/19/21    Authorization Type [employer]    Authorization Time Period 02/01/21-03/20/21    Authorization - Number of Visits 6    PT Start Time 0804    PT Stop Time 0848    PT Time Calculation (min) 44 min    Activity Tolerance Patient tolerated treatment well    Behavior During Therapy [facility] for tasks assessed/performed             Past Medical History:  Diagnosis Date   Allergic rhinitis    Hyperlipidemia    Left varicocele    history of left varicocele    Past Surgical History:  Procedure Laterality Date   BACK SURGERY     ESOPHAGOGASTRODUODENOSCOPY  2002/2007   LEFT HEART CATH AND CORONARY ANGIOGRAPHY N/A 10/24/2019   Procedure: LEFT HEART CATH AND CORONARY ANGIOGRAPHY;  Surgeon: Lorretta Harp, MD;  Location: [facility];  Service: Cardiovascular;  Laterality: N/A;   LEFT HEART CATH AND CORONARY ANGIOGRAPHY N/A 01/07/2021   Procedure: LEFT HEART CATH AND CORONARY ANGIOGRAPHY;  Surgeon: Lorretta Harp, MD;  Location: [facility];  Service: Cardiovascular;  Laterality: N/A;   SHOULDER ARTHROSCOPY WITH SUBACROMIAL DECOMPRESSION AND BICEP TENDON REPAIR Right 12/19/2020   Procedure: SHOULDER ARTHROSCOPY WITH SUBACROMIAL DECOMPRESSION AND BICEP TENDON REPAIR AND DISTAL CLAVICLE EXCISION;  Surgeon: Hiram Gash, MD;  Location: [place];  Service: Orthopedics;  Laterality: Right;    There were no vitals filed for this visit.   Subjective Assessment - 02/15/21 0807     Subjective Shoulder  still doing about the same.    Currently in Pain? Yes    Pain Score 2     Pain Location Shoulder    Pain Orientation Right    Pain Descriptors / Indicators Aching    Pain Type Surgical pain                               [facility] Adult PT Treatment/Exercise - 02/15/21 0001       Shoulder Exercises: Standing   Horizontal ABduction Strengthening;Both;Theraband;20 reps    Theraband Level (Shoulder Horizontal ABduction) Level 2 (Red)    External Rotation Strengthening;Both;10 reps    Theraband Level (Shoulder External Rotation) Level 2 (Red)    External Rotation Limitations at 90/90    Extension Strengthening;Both;10 reps;Theraband    Theraband Level (Shoulder Extension) Level 2 (Red)    Extension Limitations bent over extensions    Other Standing Exercises standing Ys with 1lb weights 15 reps      Shoulder Exercises: Pulleys   Flexion 3 minutes    Scaption 3 minutes      Shoulder Exercises: ROM/Strengthening   Cybex Row Limitations 5lb 20 reps high grip      Manual Therapy   Manual Therapy Soft tissue mobilization;Myofascial release    Soft tissue mobilization STM to R UT,LS, infraspinatus, ant deltoid    Myofascial Release MFR to R infraspinatus,  LS, and ant deltoid                     PT Education - 02/15/21 0859     Education Details HEP update with brief review    Person(s) Educated Patient    Methods Explanation;Demonstration;Tactile cues;Verbal cues;Handout    Comprehension Verbalized understanding;Returned demonstration;Verbal cues required;Need further instruction              PT Short Term Goals - 01/24/21 0846       PT SHORT TERM GOAL #1   Title Patient to be independent with initial HEP.    Status Achieved      PT SHORT TERM GOAL #2   Title Patient will demonstrate improved R shoulder ROM to 140 deg foward flexion without pain.    Status Achieved               PT Long Term Goals - 02/05/21 0818       PT LONG  TERM GOAL #1   Title Patient to be independent with advanced HEP to progress to gym based exercise program.    Time 12    Period Weeks    Status On-going    Target Date 03/20/21      PT LONG TERM GOAL #2   Title Patient to demonstrate R UE strength >/=4+/5  without pain to be able to start gentle resistance training    Time 8    Period Weeks    Status On-going   02/05/2021- 4/5 R shoulder strength, still painful with gentle resistance.   Target Date 03/19/21      PT LONG TERM GOAL #3   Title Patient to demonstrate R shoulder AROM WFL and without pain limiting.    Time 12    Period Weeks    Status Partially Met   02/06/20- met for flx and abduction, but still has pain at endrange.     PT LONG TERM GOAL #4   Title Patient will report >48 on FOTO to demonstrate improved function of R shoulder    Baseline 4    Time 12    Period Weeks    Status Achieved   02/05/2021- 55%                  Plan - 02/15/21 0904     Clinical Impression Statement Pt had reports of anterior shoulder soreness and fatigue during the strengthening exercises today, initially. Due to these complaints, we did some manual work to the shoulder and afterwards had decrease in soreness with the exercises. Continued working on scap stabilizers, giving cues with exercises for form and for safe shoulder positioning. Updated HEP focusing mainly on periscap strengthening going foward. He will need re-authorization next visit.    Personal Factors and Comorbidities Comorbidity 2;Social Background    Comorbidities history chronic L shoulder pain, history of heart disease    PT Frequency 2x / week    PT Duration 12 weeks    PT Treatment/Interventions ADLs/Self Care Home Management;Cryotherapy;Electrical Stimulation;Moist Heat;Therapeutic activities;Therapeutic exercise;Neuromuscular re-education;Patient/family education;Scar mobilization;Passive range of motion;Dry needling;Taping;Vasopneumatic Device;Joint Manipulations     PT Next Visit Plan review and progress HEP per surgical protocol.  Surgery date 12/19/20, phase 3 to 02/13/21.    PT Home Exercise Plan Access Code: [email or web address]    Consulted and Agree with Plan of Care Patient             Patient will benefit from skilled therapeutic intervention in order to  improve the following deficits and impairments:  Decreased endurance, Decreased skin integrity, Increased muscle spasms, Decreased range of motion, Decreased scar mobility, Decreased activity tolerance, Decreased strength, Increased fascial restricitons, Impaired flexibility, Impaired UE functional use, Pain  Visit Diagnosis: Acute pain of right shoulder  Stiffness of right shoulder, not elsewhere classified  Muscle weakness (generalized)     Problem List Patient Active Problem List   Diagnosis Date Noted   CAD (coronary artery disease) 01/07/2021   Chronic right shoulder pain 07/17/2020   Left tennis elbow 07/17/2020   Carpal tunnel syndrome on both sides 07/17/2020   Atypical chest pain 10/18/2019   Hemoptysis 10/18/2012   Preventative health care 04/07/2011   NUMBNESS, HAND 12/27/2007   HYPERLIPIDEMIA 12/24/2006   ALLERGIC RHINITIS 12/24/2006   GERD 12/24/2006   TB SKIN TEST, POSITIVE 12/24/2006    Artist Pais, PTA 02/15/2021, 9:07 AM  [facility] [address]  [place], [place], [address] Phone: [phone]   Fax:  [phone]  Name: Cristian Fuller MRN: [id number] Date of Birth: 03-Jan-1961

## 2021-02-19 ENCOUNTER — Ambulatory Visit: Payer: Medicaid Other

## 2021-02-19 ENCOUNTER — Other Ambulatory Visit: Payer: Self-pay

## 2021-02-19 DIAGNOSIS — M25611 Stiffness of right shoulder, not elsewhere classified: Secondary | ICD-10-CM

## 2021-02-19 DIAGNOSIS — M25511 Pain in right shoulder: Secondary | ICD-10-CM

## 2021-02-19 DIAGNOSIS — M6281 Muscle weakness (generalized): Secondary | ICD-10-CM

## 2021-02-19 NOTE — Therapy (Signed)
[facility] [address]  [address] [place], [place], [address] Phone: [phone]   Fax:  [phone]  Physical Therapy Treatment  Patient Details  Name: Cristian Fuller MRN: [id number] Date of Birth: 02-11-1960 Referring Provider (PT): Ramond Marrow   Encounter Date: 02/19/2021   PT End of Session - 02/19/21 0854     Visit Number 15    Number of Visits 24    Date for PT Re-Evaluation 03/19/21    Authorization Type [employer]    Authorization Time Period 02/01/21-03/20/21    Authorization - Number of Visits 6    PT Start Time 0803    PT Stop Time 0900   10 min on CP   PT Time Calculation (min) 57 min    Activity Tolerance Patient tolerated treatment well;Patient limited by pain    Behavior During Therapy [facility] for tasks assessed/performed             Past Medical History:  Diagnosis Date   Allergic rhinitis    Hyperlipidemia    Left varicocele    history of left varicocele    Past Surgical History:  Procedure Laterality Date   BACK SURGERY     ESOPHAGOGASTRODUODENOSCOPY  2002/2007   LEFT HEART CATH AND CORONARY ANGIOGRAPHY N/A 10/24/2019   Procedure: LEFT HEART CATH AND CORONARY ANGIOGRAPHY;  Surgeon: Runell Gess, MD;  [facility]: [facility];  Service: Cardiovascular;  Laterality: N/A;   LEFT HEART CATH AND CORONARY ANGIOGRAPHY N/A 01/07/2021   Procedure: LEFT HEART CATH AND CORONARY ANGIOGRAPHY;  Surgeon: Runell Gess, MD;  Location: [facility];  Service: Cardiovascular;  Laterality: N/A;   SHOULDER ARTHROSCOPY WITH SUBACROMIAL DECOMPRESSION AND BICEP TENDON REPAIR Right 12/19/2020   Procedure: SHOULDER ARTHROSCOPY WITH SUBACROMIAL DECOMPRESSION AND BICEP TENDON REPAIR AND DISTAL CLAVICLE EXCISION;  Surgeon: Bjorn Pippin, MD;  Location: [facility];  Service: Orthopedics;  Laterality: Right;    There were no vitals filed for this visit.   Subjective Assessment -  02/19/21 0806     Subjective Pt still feeling some limitation with reaching across his body and reaching back. Notes that any pushing motion with his shoulder gives him sharp pain.    Currently in Pain? Yes    Pain Score 2     Pain Location Shoulder    Pain Orientation Right    Pain Descriptors / Indicators Aching    Pain Type Surgical pain                [facility] PT Assessment - 02/19/21 0001       Strength   Right/Left Shoulder Right    Right Shoulder Flexion 4/5   pain   Right Shoulder ABduction 4/5   pain   Right Shoulder Internal Rotation 4/5    Right Shoulder External Rotation 4+/5                           [facility] Adult PT Treatment/Exercise - 02/19/21 0001       Shoulder Exercises: Seated   Flexion Limitations tried but limited by pain      Shoulder Exercises: Standing   Flexion AROM;Right;15 reps    Flexion Limitations with small weighted ball    Other Standing Exercises isometric shoulder flexion walkout with red TB 10x2"      Shoulder Exercises: ROM/Strengthening   UBE (Upper Arm Bike) L1.0, fwd/49min back  Modalities   Modalities Cryotherapy      Moist Heat Therapy   Number Minutes Moist Heat 10 Minutes    Moist Heat Location Shoulder      Manual Therapy   Manual Therapy Soft tissue mobilization;Myofascial release    Soft tissue mobilization STM to R biceps origin, anterior deltoid, UT, levator   Myofascial Release TPR to R biceps origin                       PT Short Term Goals - 01/24/21 0846       PT SHORT TERM GOAL #1   Title Patient to be independent with initial HEP.    Status Achieved      PT SHORT TERM GOAL #2   Title Patient will demonstrate improved R shoulder ROM to 140 deg foward flexion without pain.    Status Achieved               PT Long Term Goals - 02/05/21 0818       PT LONG TERM GOAL #1   Title Patient to be independent with advanced HEP to progress to gym based exercise  program.    Time 12    Period Weeks    Status On-going    Target Date 03/20/21      PT LONG TERM GOAL #2   Title Patient to demonstrate R UE strength >/=4+/5  without pain to be able to start gentle resistance training    Time 8    Period Weeks    Status On-going   02/05/2021- 4/5 R shoulder strength, still painful with gentle resistance.   Target Date 03/19/21      PT LONG TERM GOAL #3   Title Patient to demonstrate R shoulder AROM WFL and without pain limiting.    Time 12    Period Weeks    Status Partially Met   02/06/20- met for flx and abduction, but still has pain at endrange.     PT LONG TERM GOAL #4   Title Patient will report >48 on FOTO to demonstrate improved function of R shoulder    Baseline 4    Time 12    Period Weeks    Status Achieved   02/05/2021- 55%                  Plan - 02/19/21 0855     Clinical Impression Statement Pt arrived today noting continued difficulties with reaching across his body and behind the back. He notes that pushing motions using his R shoulder are difficult, like pushing a key in the door. Weakness remains in his R shoulder with pain noted while giving resistance. Today was a MT focused treatment due to the limitations he had. He was limited in exercises tolerance today, from an incident over the weekend, where he got out of the shower and tried to dry his groin area with his R shoulder and ever since he has had increased soreness. He would need continued PT to improve R shoulder strength and pain free ROM to increase his ability with ADLs.    Personal Factors and Comorbidities Comorbidity 2;Social Background    Comorbidities history chronic L shoulder pain, history of heart disease    PT Frequency 2x / week    PT Duration 12 weeks    PT Treatment/Interventions ADLs/Self Care Home Management;Cryotherapy;Electrical Stimulation;Moist Heat;Therapeutic activities;Therapeutic exercise;Neuromuscular re-education;Patient/family education;Scar  mobilization;Passive range of motion;Dry needling;Taping;Vasopneumatic Device;Joint Manipulations    PT  Next Visit Plan review and progress HEP per surgical protocol.  Surgery date 12/19/20, phase 3 to 02/13/21.    PT Home Exercise Plan Access Code: [email or web address]    Consulted and Agree with Plan of Care Patient             Patient will benefit from skilled therapeutic intervention in order to improve the following deficits and impairments:  Decreased endurance, Decreased skin integrity, Increased muscle spasms, Decreased range of motion, Decreased scar mobility, Decreased activity tolerance, Decreased strength, Increased fascial restricitons, Impaired flexibility, Impaired UE functional use, Pain  Visit Diagnosis: Acute pain of right shoulder  Stiffness of right shoulder, not elsewhere classified  Muscle weakness (generalized)     Problem List Patient Active Problem List   Diagnosis Date Noted   CAD (coronary artery disease) 01/07/2021   Chronic right shoulder pain 07/17/2020   Left tennis elbow 07/17/2020   Carpal tunnel syndrome on both sides 07/17/2020   Atypical chest pain 10/18/2019   Hemoptysis 10/18/2012   Preventative health care 04/07/2011   NUMBNESS, HAND 12/27/2007   HYPERLIPIDEMIA 12/24/2006   ALLERGIC RHINITIS 12/24/2006   GERD 12/24/2006   TB SKIN TEST, POSITIVE 12/24/2006    Artist Pais, PTA 02/19/2021, 9:08 AM  [facility] [address]  [place], [place], [address] Phone: [phone]   Fax:  [phone]  Name: Cristian Fuller MRN: [id number] Date of Birth: 01/21/1961

## 2021-02-22 ENCOUNTER — Other Ambulatory Visit: Payer: Self-pay

## 2021-02-22 ENCOUNTER — Ambulatory Visit: Payer: Medicaid Other

## 2021-02-22 DIAGNOSIS — M25511 Pain in right shoulder: Secondary | ICD-10-CM

## 2021-02-22 DIAGNOSIS — M25611 Stiffness of right shoulder, not elsewhere classified: Secondary | ICD-10-CM

## 2021-02-22 DIAGNOSIS — M6281 Muscle weakness (generalized): Secondary | ICD-10-CM

## 2021-02-22 DIAGNOSIS — R293 Abnormal posture: Secondary | ICD-10-CM

## 2021-02-22 NOTE — Therapy (Signed)
[facility] [address]  [place], [place], [address] Phone: [phone]   Fax:  [phone]  Physical Therapy Treatment  Patient Details  Name: Cristian Fuller MRN: [id number] Date of Birth: 10-17-1960 Referring Provider (PT): Ophelia Charter   Encounter Date: 02/22/2021   PT End of Session - 02/22/21 0849     Visit Number 16    Number of Visits 24    Date for PT Re-Evaluation 03/19/21    Authorization Type [employer]    Authorization Time Period --    Authorization - Number of Visits --    PT Start Time 0804    PT Stop Time 0859    PT Time Calculation (min) 55 min    Activity Tolerance Patient tolerated treatment well;Patient limited by fatigue    Behavior During Therapy [facility] for tasks assessed/performed             Past Medical History:  Diagnosis Date   Allergic rhinitis    Hyperlipidemia    Left varicocele    history of left varicocele    Past Surgical History:  Procedure Laterality Date   BACK SURGERY     ESOPHAGOGASTRODUODENOSCOPY  2002/2007   LEFT HEART CATH AND CORONARY ANGIOGRAPHY N/A 10/24/2019   Procedure: LEFT HEART CATH AND CORONARY ANGIOGRAPHY;  Surgeon: Lorretta Harp, MD;  Location: [facility];  Service: Cardiovascular;  Laterality: N/A;   LEFT HEART CATH AND CORONARY ANGIOGRAPHY N/A 01/07/2021   Procedure: LEFT HEART CATH AND CORONARY ANGIOGRAPHY;  Surgeon: Lorretta Harp, MD;  Location: [facility];  Service: Cardiovascular;  Laterality: N/A;   SHOULDER ARTHROSCOPY WITH SUBACROMIAL DECOMPRESSION AND BICEP TENDON REPAIR Right 12/19/2020   Procedure: SHOULDER ARTHROSCOPY WITH SUBACROMIAL DECOMPRESSION AND BICEP TENDON REPAIR AND DISTAL CLAVICLE EXCISION;  Surgeon: Hiram Gash, MD;  Location: [facility];  Service: Orthopedics;  Laterality: Right;    There were no vitals filed for this visit.   Subjective Assessment - 02/22/21 0813      Subjective Pt reports feeling a pulling/sore sensation along his R abdomen when reaching OH, F/U with doctor and they plan to do a CT scan.    Currently in Pain? Yes    Pain Score 2     Pain Location Shoulder    Pain Orientation Right    Pain Descriptors / Indicators Aching    Pain Type Surgical pain                [facility] PT Assessment - 02/22/21 0001       AROM   Right Shoulder Flexion 155 Degrees    Right Shoulder ABduction 120 Degrees                           [facility] Adult PT Treatment/Exercise - 02/22/21 0001       Shoulder Exercises: Seated   Other Seated Exercises fwd reaching weight ball, B UE with red ball, R UE with green ball 10 reps each      Shoulder Exercises: Standing   External Rotation Strengthening;Right;12 reps;Theraband    Theraband Level (Shoulder External Rotation) Level 3 (Green)    Internal Rotation Strengthening;Right;Theraband;15 reps    Theraband Level (Shoulder Internal Rotation) Level 3 (Green)    Extension Strengthening;Both;Theraband;15 reps    Theraband Level (Shoulder Extension) Level 3 (Green)    Row Strengthening;Both;20 reps;Theraband    Theraband Level (Shoulder Row)  Level 3 (Green)    Diagonals Strengthening;Right;20 reps;Theraband    Theraband Level (Shoulder Diagonals) Level 1 (Yellow)    Diagonals Limitations D1 ext      Shoulder Exercises: ROM/Strengthening   UBE (Upper Arm Bike) L1.0, 61min fwd/23min back      Moist Heat Therapy   Number Minutes Moist Heat 10 Minutes    Moist Heat Location Shoulder                       PT Short Term Goals - 01/24/21 0846       PT SHORT TERM GOAL #1   Title Patient to be independent with initial HEP.    Status Achieved      PT SHORT TERM GOAL #2   Title Patient will demonstrate improved R shoulder ROM to 140 deg foward flexion without pain.    Status Achieved               PT Long Term Goals - 02/05/21 0818       PT LONG TERM GOAL #1   Title  Patient to be independent with advanced HEP to progress to gym based exercise program.    Time 12    Period Weeks    Status On-going    Target Date 03/20/21      PT LONG TERM GOAL #2   Title Patient to demonstrate R UE strength >/=4+/5  without pain to be able to start gentle resistance training    Time 8    Period Weeks    Status On-going   02/05/2021- 4/5 R shoulder strength, still painful with gentle resistance.   Target Date 03/19/21      PT LONG TERM GOAL #3   Title Patient to demonstrate R shoulder AROM WFL and without pain limiting.    Time 12    Period Weeks    Status Partially Met   02/23/20- met for flx and abduction, but still has pain at endrange.     PT LONG TERM GOAL #4   Title Patient will report >48 on FOTO to demonstrate improved function of R shoulder    Baseline 4    Time 12    Period Weeks    Status Achieved   02/05/2021- 55%                  Plan - 02/22/21 0850     Clinical Impression Statement Pt came in with reports of feeling some lower R abdominal pain with OH reaching for the past 3 days. He was seen by a doctor and they plan on doing a CT scan to rule any issues. He had no complaints of pain with the interventions today just noting some fatigue in the shoulder. He had some pain with the fwd reaching but after instructions to keep shoulders retracted, the pain subsided. He continues to become fatigued quickly after doing RTC and periscap strengthening and needs instruction to promote proper mechanics of the [place] joint. MHP applied post session for relief.    Personal Factors and Comorbidities Comorbidity 2;Social Background    Comorbidities history chronic L shoulder pain, history of heart disease    PT Frequency 2x / week    PT Duration 12 weeks    PT Treatment/Interventions ADLs/Self Care Home Management;Cryotherapy;Electrical Stimulation;Moist Heat;Therapeutic activities;Therapeutic exercise;Neuromuscular re-education;Patient/family education;Scar  mobilization;Passive range of motion;Dry needling;Taping;Vasopneumatic Device;Joint Manipulations    PT Next Visit Plan review and progress HEP per surgical protocol.  Surgery date 12/19/20,  phase 3 to 02/13/21.    PT Home Exercise Plan Access Code: [email or web address]    Consulted and Agree with Plan of Care Patient             Patient will benefit from skilled therapeutic intervention in order to improve the following deficits and impairments:  Decreased endurance, Decreased skin integrity, Increased muscle spasms, Decreased range of motion, Decreased scar mobility, Decreased activity tolerance, Decreased strength, Increased fascial restricitons, Impaired flexibility, Impaired UE functional use, Pain  Visit Diagnosis: Acute pain of right shoulder  Stiffness of right shoulder, not elsewhere classified  Muscle weakness (generalized)  Abnormal posture     Problem List Patient Active Problem List   Diagnosis Date Noted   CAD (coronary artery disease) 01/07/2021   Chronic right shoulder pain 07/17/2020   Left tennis elbow 07/17/2020   Carpal tunnel syndrome on both sides 07/17/2020   Atypical chest pain 10/18/2019   Hemoptysis 10/18/2012   Preventative health care 04/07/2011   NUMBNESS, HAND 12/27/2007   HYPERLIPIDEMIA 12/24/2006   ALLERGIC RHINITIS 12/24/2006   GERD 12/24/2006   TB SKIN TEST, POSITIVE 12/24/2006    Artist Pais, PTA 02/22/2021, 8:53 AM  [facility] [address]  [place], [place], [address] Phone: [phone]   Fax:  [phone]  Name: Cristian Fuller MRN: [id number] Date of Birth: Jan 29, 1961

## 2021-02-26 ENCOUNTER — Other Ambulatory Visit: Payer: Self-pay

## 2021-02-26 ENCOUNTER — Encounter: Payer: Self-pay | Admitting: Physical Therapy

## 2021-02-26 ENCOUNTER — Ambulatory Visit: Payer: Medicaid Other | Admitting: Physical Therapy

## 2021-02-26 DIAGNOSIS — R293 Abnormal posture: Secondary | ICD-10-CM

## 2021-02-26 DIAGNOSIS — M25511 Pain in right shoulder: Secondary | ICD-10-CM | POA: Diagnosis not present

## 2021-02-26 DIAGNOSIS — M6281 Muscle weakness (generalized): Secondary | ICD-10-CM

## 2021-02-26 DIAGNOSIS — M25522 Pain in left elbow: Secondary | ICD-10-CM

## 2021-02-26 DIAGNOSIS — M25611 Stiffness of right shoulder, not elsewhere classified: Secondary | ICD-10-CM

## 2021-02-26 NOTE — Patient Instructions (Signed)
Access Code: [email or web address] URL: [email or web address] Date: 02/26/2021 Prepared by: Almyra Free  Exercises Standing Shoulder Internal Rotation with Anchored Resistance - 1 x daily - 3 x weekly - 2-3 sets - 10 reps Shoulder External Rotation and Scapular Retraction with Resistance - 1 x daily - 3 x weekly - 2-3 sets - 10 reps Single Arm Punch with Resistance - 1 x daily - 3 x weekly - 2-3 sets - 10 reps Standing Single Arm Low Row with Anchored Resistance - 1 x daily - 3 x weekly - 2-3 sets - 10 reps Standing Shoulder Scaption - 1 x daily - 3 x weekly - 2-3 sets - 10 reps Standing Staggered Push Up with Scapular Retraction at Wall - 1 x daily - 3 x weekly - 2-3 sets - 10 reps Sidelying Shoulder ER with Towel and Dumbbell - 1 x daily - 3 x weekly - 2-3 sets - 10 reps - 3 sec hold Sidelying Shoulder Abduction Palm Forward - 1 x daily - 3 x weekly - 2-3 sets - 10 reps

## 2021-02-26 NOTE — Therapy (Signed)
[facility] [place] [address]  [place], [place], [address] Phone: [phone]   Fax:  [phone]  Physical Therapy Treatment  Patient Details  Name: Cristian Fuller MRN: [id number] Date of Birth: 16-Mar-1960 Referring Provider (PT): Ophelia Charter   Encounter Date: 02/26/2021   PT End of Session - 02/26/21 0805     Visit Number 17    Number of Visits 24    Date for PT Re-Evaluation 03/19/21    Authorization Type [employer]    Authorization Time Period 02/01/21-03/20/21    PT Start Time 0805    PT Stop Time 0846    PT Time Calculation (min) 41 min    Activity Tolerance Patient tolerated treatment well;Patient limited by fatigue;Patient limited by pain    Behavior During Therapy [facility] for tasks assessed/performed             Past Medical History:  Diagnosis Date   Allergic rhinitis    Hyperlipidemia    Left varicocele    history of left varicocele    Past Surgical History:  Procedure Laterality Date   BACK SURGERY     ESOPHAGOGASTRODUODENOSCOPY  2002/2007   LEFT HEART CATH AND CORONARY ANGIOGRAPHY N/A 10/24/2019   Procedure: LEFT HEART CATH AND CORONARY ANGIOGRAPHY;  Surgeon: Lorretta Harp, MD;  Location: [facility];  Service: Cardiovascular;  Laterality: N/A;   LEFT HEART CATH AND CORONARY ANGIOGRAPHY N/A 01/07/2021   Procedure: LEFT HEART CATH AND CORONARY ANGIOGRAPHY;  Surgeon: Lorretta Harp, MD;  Location: [facility];  Service: Cardiovascular;  Laterality: N/A;   SHOULDER ARTHROSCOPY WITH SUBACROMIAL DECOMPRESSION AND BICEP TENDON REPAIR Right 12/19/2020   Procedure: SHOULDER ARTHROSCOPY WITH SUBACROMIAL DECOMPRESSION AND BICEP TENDON REPAIR AND DISTAL CLAVICLE EXCISION;  Surgeon: Hiram Gash, MD;  Location: [facility];  Service: Orthopedics;  Laterality: Right;    There were no vitals filed for this visit.   Subjective Assessment - 02/26/21 0807      Subjective Pt going to see Dr. Griffin Basil this morning. [name]. Getting CT on Thursday. Patient reporting difficulty with horizontal ADD, reaching behind his back and lifting with right arm straight.    Currently in Pain? No/denies                [facility] PT Assessment - 02/26/21 0001       AROM   Right Shoulder Flexion 165 Degrees   pain at 140 deg   Right Shoulder ABduction 120 Degrees   stops due to pain   Right Shoulder Internal Rotation 68 Degrees   functional with thumb to L1   Right Shoulder External Rotation 70 Degrees      PROM   Right Shoulder Flexion 175 Degrees    Right Shoulder ABduction 140 Degrees    Right Shoulder Internal Rotation 75 Degrees    Right Shoulder External Rotation 80 Degrees      Strength   Right Shoulder Flexion 4-/5   releases due to pain   Right Shoulder Extension 5/5    Right Shoulder ABduction 4/5    Right Shoulder Internal Rotation 4/5    Right Shoulder External Rotation 4+/5                           [facility] Adult PT Treatment/Exercise - 02/26/21 0001       Self-Care   Self-Care Other  Self-Care Comments    Other Self-Care Comments  spent time discussing his concerns regarding abdomen pain and possible causes      Shoulder Exercises: Sidelying   External Rotation Right;10 reps    External Rotation Weight (lbs) 2    External Rotation Limitations partial range    Other Sidelying Exercises horizontal ABD 1# x 10      Shoulder Exercises: Standing   Other Standing Exercises flex 1#/scap 1#/ABD 0 # x 10 ea to 90 deg      Shoulder Exercises: Pulleys   Flexion 2 minutes    ABduction 2 minutes;1 minute    Other Pulley Exercises no pull in abdomen with pulleys      Shoulder Exercises: Stretch   Table Stretch - Abduction 1 rep;60 seconds    Table Stretch - ABduction Limitations horizontal ABD on 1/2 foam roll    Other Shoulder Stretches standing IR bil hands behind back x 10 in  painfree motion                     PT Education - 02/26/21 1026     Education Details HEP    Person(s) Educated Patient    Methods Explanation;Demonstration;Handout    Comprehension Verbalized understanding;Returned demonstration              PT Short Term Goals - 01/24/21 0846       PT SHORT TERM GOAL #1   Title Patient to be independent with initial HEP.    Status Achieved      PT SHORT TERM GOAL #2   Title Patient will demonstrate improved R shoulder ROM to 140 deg foward flexion without pain.    Status Achieved               PT Long Term Goals - 02/26/21 0841       PT LONG TERM GOAL #1   Title Patient to be independent with advanced HEP to progress to gym based exercise program.    Status On-going      PT LONG TERM GOAL #2   Title Patient to demonstrate R UE strength >/=4+/5  without pain to be able to start gentle resistance training    Status On-going      PT LONG TERM GOAL #3   Title Patient to demonstrate R shoulder AROM WFL and without pain limiting.    Status On-going      PT LONG TERM GOAL #4   Title Patient will report >48 on FOTO to demonstrate improved function of R shoulder    Status Achieved                   Plan - 02/26/21 1026     Clinical Impression Statement Patient is progressing with both ROM and strength goals, but is still limited by pain particularly with lightweight resistance in flex, ABD and ER. He has functional IR behind his back but has pain anteriorly with this. He will benefit from continued PT to address these deficits and reach his unmet goals.    Comorbidities history chronic L shoulder pain, history of heart disease    PT Frequency 2x / week    PT Duration 12 weeks    PT Treatment/Interventions ADLs/Self Care Home Management;Cryotherapy;Electrical Stimulation;Moist Heat;Therapeutic activities;Therapeutic exercise;Neuromuscular re-education;Patient/family education;Scar mobilization;Passive range  of motion;Dry needling;Taping;Vasopneumatic Device;Joint Manipulations    PT Next Visit Plan Continue shoulder strengthening; progress HEP per surgical protocol.  Surgery date 12/19/20, phase 3 to 02/13/21.  PT Home Exercise Plan Access Code: [email or web address]    Consulted and Agree with Plan of Care Patient             Patient will benefit from skilled therapeutic intervention in order to improve the following deficits and impairments:  Decreased endurance, Decreased skin integrity, Increased muscle spasms, Decreased range of motion, Decreased scar mobility, Decreased activity tolerance, Decreased strength, Increased fascial restricitons, Impaired flexibility, Impaired UE functional use, Pain  Visit Diagnosis: Acute pain of right shoulder  Stiffness of right shoulder, not elsewhere classified  Muscle weakness (generalized)  Abnormal posture  Pain in left elbow     Problem List Patient Active Problem List   Diagnosis Date Noted   CAD (coronary artery disease) 01/07/2021   Chronic right shoulder pain 07/17/2020   Left tennis elbow 07/17/2020   Carpal tunnel syndrome on both sides 07/17/2020   Atypical chest pain 10/18/2019   Hemoptysis 10/18/2012   Preventative health care 04/07/2011   NUMBNESS, HAND 12/27/2007   HYPERLIPIDEMIA 12/24/2006   ALLERGIC RHINITIS 12/24/2006   GERD 12/24/2006   TB SKIN TEST, POSITIVE 12/24/2006    Madelyn Flavors, PT 02/26/2021, 11:20 AM  [facility] [address]  [place], [place], [address] Phone: [phone]   Fax:  [phone]  Name: Grainger Mccarley MRN: [id number] Date of Birth: Sep 13, 1960

## 2021-03-01 ENCOUNTER — Other Ambulatory Visit: Payer: Self-pay

## 2021-03-01 ENCOUNTER — Ambulatory Visit: Payer: Medicaid Other

## 2021-03-01 DIAGNOSIS — M6281 Muscle weakness (generalized): Secondary | ICD-10-CM

## 2021-03-01 DIAGNOSIS — M25511 Pain in right shoulder: Secondary | ICD-10-CM | POA: Diagnosis not present

## 2021-03-01 DIAGNOSIS — R293 Abnormal posture: Secondary | ICD-10-CM

## 2021-03-01 DIAGNOSIS — M25611 Stiffness of right shoulder, not elsewhere classified: Secondary | ICD-10-CM

## 2021-03-01 NOTE — Therapy (Signed)
[facility] [place] [address]  [place], [place], [address] Phone: [phone]   Fax:  [phone]  Physical Therapy Treatment  Patient Details  Name: Masami Plata MRN: [id number] Date of Birth: [birth year] Referring Provider (PT): Ophelia Charter   Encounter Date: 03/01/2021   PT End of Session - 03/01/21 0846     Visit Number 18    Number of Visits 24    Date for PT Re-Evaluation 03/19/21    Authorization Type [employer]    Authorization Time Period 02/01/21-03/20/21    PT Start Time 0804    PT Stop Time 2202    PT Time Calculation (min) 51 min    Activity Tolerance Patient tolerated treatment well;Patient limited by fatigue;Patient limited by pain    Behavior During Therapy [facility] for tasks assessed/performed             Past Medical History:  Diagnosis Date   Allergic rhinitis    Hyperlipidemia    Left varicocele    history of left varicocele    Past Surgical History:  Procedure Laterality Date   BACK SURGERY     ESOPHAGOGASTRODUODENOSCOPY  2002/2007   LEFT HEART CATH AND CORONARY ANGIOGRAPHY N/A 10/24/2019   Procedure: LEFT HEART CATH AND CORONARY ANGIOGRAPHY;  Surgeon: Lorretta Harp, MD;  Location: [facility];  Service: Cardiovascular;  Laterality: N/A;   LEFT HEART CATH AND CORONARY ANGIOGRAPHY N/A 01/07/2021   Procedure: LEFT HEART CATH AND CORONARY ANGIOGRAPHY;  Surgeon: Lorretta Harp, MD;  Location: [facility];  Service: Cardiovascular;  Laterality: N/A;   SHOULDER ARTHROSCOPY WITH SUBACROMIAL DECOMPRESSION AND BICEP TENDON REPAIR Right 12/19/2020   Procedure: SHOULDER ARTHROSCOPY WITH SUBACROMIAL DECOMPRESSION AND BICEP TENDON REPAIR AND DISTAL CLAVICLE EXCISION;  Surgeon: Hiram Gash, MD;  Location: [facility];  Service: Orthopedics;  Laterality: Right;    There were no vitals filed for this visit.   Subjective Assessment - 03/01/21 0806      Subjective Pt reports that he was denied a CT scan by insurance, will try to F/U with doctor to see where to go next.    Currently in Pain? No/denies                               [facility] Adult PT Treatment/Exercise - 03/01/21 0001       Shoulder Exercises: Prone   Flexion Strengthening;Right;10 reps;Weights    Flexion Weight (lbs) 3    Extension Strengthening;Right;20 reps;Weights    Extension Weight (lbs) 3    Other Prone Exercises prone row with 4lb weight 2x10      Shoulder Exercises: Sidelying   External Rotation Strengthening;Right;15 reps;Weights    External Rotation Weight (lbs) 2    Flexion Strengthening;Right;10 reps;Weights    Flexion Weight (lbs) 2    ABduction Strengthening;Right;10 reps;Weights    ABduction Weight (lbs) 1      Shoulder Exercises: Standing   Internal Rotation AAROM;Both;10 reps    Internal Rotation Limitations with cane behind back    Flexion AROM;Both;Weights    Shoulder Flexion Weight (lbs) 1    Flexion Limitations with forearms supinated    Extension AAROM;Both;10 reps    Extension Limitations with cane    Diagonals Strengthening;Both;15 reps;Theraband    Theraband Level (Shoulder Diagonals) Level 2 (Red)    Diagonals Limitations D1 ext      Shoulder Exercises: ROM/Strengthening  UBE (Upper Arm Bike) L2.0 70min fwd/back      Moist Heat Therapy   Number Minutes Moist Heat 10 Minutes    Moist Heat Location Shoulder                       PT Short Term Goals - 01/24/21 0846       PT SHORT TERM GOAL #1   Title Patient to be independent with initial HEP.    Status Achieved      PT SHORT TERM GOAL #2   Title Patient will demonstrate improved R shoulder ROM to 140 deg foward flexion without pain.    Status Achieved               PT Long Term Goals - 02/26/21 0841       PT LONG TERM GOAL #1   Title Patient to be independent with advanced HEP to progress to gym based exercise program.    Status  On-going      PT LONG TERM GOAL #2   Title Patient to demonstrate R UE strength >/=4+/5  without pain to be able to start gentle resistance training    Status On-going      PT LONG TERM GOAL #3   Title Patient to demonstrate R shoulder AROM WFL and without pain limiting.    Status On-going      PT LONG TERM GOAL #4   Title Patient will report >48 on FOTO to demonstrate improved function of R shoulder    Status Achieved                   Plan - 03/01/21 0847     Clinical Impression Statement Pt still noting soreness and pain with certain OH and behind the back movements. He needed cuing to keep shld ER and forearm supination to avoid shoulder impingement. Once the shoulder position was fixed pain decreased. He requires cues for full scap retraction during rows andother scap stabilizing exercises. Ended session with [facility] to decrease pain and muscle stiffness.    Personal Factors and Comorbidities Comorbidity 2;Social Background    Comorbidities history chronic L shoulder pain, history of heart disease    PT Frequency 2x / week    PT Duration 12 weeks    PT Treatment/Interventions ADLs/Self Care Home Management;Cryotherapy;Electrical Stimulation;Moist Heat;Therapeutic activities;Therapeutic exercise;Neuromuscular re-education;Patient/family education;Scar mobilization;Passive range of motion;Dry needling;Taping;Vasopneumatic Device;Joint Manipulations    PT Next Visit Plan Continue shoulder strengthening; progress HEP per surgical protocol.  Surgery date 12/19/20, phase 3 to 02/13/21.    PT Home Exercise Plan Access Code: [email or web address]    Consulted and Agree with Plan of Care Patient             Patient will benefit from skilled therapeutic intervention in order to improve the following deficits and impairments:  Decreased endurance, Decreased skin integrity, Increased muscle spasms, Decreased range of motion, Decreased scar mobility, Decreased activity tolerance, Decreased strength,  Increased fascial restricitons, Impaired flexibility, Impaired UE functional use, Pain  Visit Diagnosis: Acute pain of right shoulder  Stiffness of right shoulder, not elsewhere classified  Muscle weakness (generalized)  Abnormal posture     Problem List Patient Active Problem List   Diagnosis Date Noted   CAD (coronary artery disease) 01/07/2021   Chronic right shoulder pain 07/17/2020   Left tennis elbow 07/17/2020   Carpal tunnel syndrome on both sides 07/17/2020   Atypical chest pain 10/18/2019   Hemoptysis 10/18/2012   Preventative  health care 04/07/2011   NUMBNESS, HAND 12/27/2007   HYPERLIPIDEMIA 12/24/2006   ALLERGIC RHINITIS 12/24/2006   GERD 12/24/2006   TB SKIN TEST, POSITIVE 12/24/2006    Artist Pais, PTA 03/01/2021, 9:52 AM  [facility] [address]  [place], [place], [address] Phone: [phone]   Fax:  [phone]  Name: Dekota Shenk MRN: [id number] Date of Birth: 1960/05/02

## 2021-03-05 ENCOUNTER — Encounter: Payer: Self-pay | Admitting: Physical Therapy

## 2021-03-05 ENCOUNTER — Ambulatory Visit: Payer: Medicaid Other | Admitting: Physical Therapy

## 2021-03-05 ENCOUNTER — Other Ambulatory Visit: Payer: Self-pay

## 2021-03-05 DIAGNOSIS — M25511 Pain in right shoulder: Secondary | ICD-10-CM | POA: Diagnosis not present

## 2021-03-05 DIAGNOSIS — M25611 Stiffness of right shoulder, not elsewhere classified: Secondary | ICD-10-CM

## 2021-03-05 DIAGNOSIS — M6281 Muscle weakness (generalized): Secondary | ICD-10-CM

## 2021-03-05 NOTE — Therapy (Signed)
[facility] [address]  [place], [place], [address] Phone: [phone]   Fax:  [phone]  Physical Therapy Treatment  Patient Details  Name: Cristian Fuller MRN: [id number] Date of Birth: 08-12-1960 Referring Provider (PT): Ophelia Charter   Encounter Date: 03/05/2021   PT End of Session - 03/05/21 0805     Visit Number 19    Number of Visits 24    Date for PT Re-Evaluation 03/19/21    Authorization Type [employer]    Authorization Time Period 02/01/21-03/20/21    PT Start Time 0802    PT Stop Time 0845    PT Time Calculation (min) 43 min    Activity Tolerance Patient tolerated treatment well;Patient limited by fatigue    Behavior During Therapy [facility] for tasks assessed/performed             Past Medical History:  Diagnosis Date   Allergic rhinitis    Hyperlipidemia    Left varicocele    history of left varicocele    Past Surgical History:  Procedure Laterality Date   BACK SURGERY     ESOPHAGOGASTRODUODENOSCOPY  2002/2007   LEFT HEART CATH AND CORONARY ANGIOGRAPHY N/A 10/24/2019   Procedure: LEFT HEART CATH AND CORONARY ANGIOGRAPHY;  Surgeon: Lorretta Harp, MD;  Location: [facility];  Service: Cardiovascular;  Laterality: N/A;   LEFT HEART CATH AND CORONARY ANGIOGRAPHY N/A 01/07/2021   Procedure: LEFT HEART CATH AND CORONARY ANGIOGRAPHY;  Surgeon: Lorretta Harp, MD;  Location: [facility];  Service: Cardiovascular;  Laterality: N/A;   SHOULDER ARTHROSCOPY WITH SUBACROMIAL DECOMPRESSION AND BICEP TENDON REPAIR Right 12/19/2020   Procedure: SHOULDER ARTHROSCOPY WITH SUBACROMIAL DECOMPRESSION AND BICEP TENDON REPAIR AND DISTAL CLAVICLE EXCISION;  Surgeon: Hiram Gash, MD;  Location: [facility];  Service: Orthopedics;  Laterality: Right;    There were no vitals filed for this visit.   Subjective Assessment - 03/05/21 0804     Subjective Pt. reports pain in R  anterior shoulder when washing face in the morning.    Currently in Pain? Yes    Pain Score 4    with sudden movements   Pain Location Shoulder    Pain Orientation Right                               [facility] Adult PT Treatment/Exercise - 03/05/21 0001       Exercises   Exercises Shoulder      Shoulder Exercises: Supine   Protraction Strengthening;Right;20 reps;Weights    Protraction Weight (lbs) 3      Shoulder Exercises: Prone   Flexion Strengthening;Right;Weights;20 reps    Flexion Weight (lbs) 3    Extension Strengthening;Right;20 reps;Weights    Extension Weight (lbs) 3    Horizontal ABduction 1 Strengthening;Right;20 reps;Weights    Horizontal ABduction 1 Weight (lbs) 3    Other Prone Exercises prone row with 4lb weight 2x10      Shoulder Exercises: Sidelying   External Rotation Strengthening;Right;Weights;20 reps    External Rotation Weight (lbs) 3      Shoulder Exercises: Standing   Diagonals Strengthening;Both;15 reps;Theraband    Theraband Level (Shoulder Diagonals) Level 2 (Red)    Diagonals Limitations D2 ext, noted less pain and improved ROM today    Other Standing Exercises --    Other Standing Exercises --      Shoulder Exercises:  ROM/Strengthening   UBE (Upper Arm Bike) L2.0 32min fwd/back    Wall Pushups 20 reps    Ball on Wall cw/ccw circles 2 x 20 each direction      Modalities   Modalities Iontophoresis      Iontophoresis   Type of Iontophoresis Dexamethasone    Location R anterior shoulder over AC joint    Dose 4mg /ml    Time 4 hour patch                     PT Education - 03/05/21 0904     Education Details Educated on iontophoresis patch including purpose and mechanism,  wear time, when to remove, remove early if skin irritated/burning.    Person(s) Educated Patient    Methods Explanation;Handout    Comprehension Verbalized understanding              PT Short Term Goals - 01/24/21 0846       PT  SHORT TERM GOAL #1   Title Patient to be independent with initial HEP.    Status Achieved      PT SHORT TERM GOAL #2   Title Patient will demonstrate improved R shoulder ROM to 140 deg foward flexion without pain.    Status Achieved               PT Long Term Goals - 02/26/21 0841       PT LONG TERM GOAL #1   Title Patient to be independent with advanced HEP to progress to gym based exercise program.    Status On-going      PT LONG TERM GOAL #2   Title Patient to demonstrate R UE strength >/=4+/5  without pain to be able to start gentle resistance training    Status On-going      PT LONG TERM GOAL #3   Title Patient to demonstrate R shoulder AROM WFL and without pain limiting.    Status On-going      PT LONG TERM GOAL #4   Title Patient will report >48 on FOTO to demonstrate improved function of R shoulder    Status Achieved                   Plan - 03/05/21 0857     Clinical Impression Statement [name] reported that he was able to perform exercises today like diagonals and sidelying ER with much less pain and improved ROM, reporting primarily muscle fatigue, especially with sustained activity like ball on wall.  Still consistently reporting pain anterior shoulder, so trialed iontophoresis patch with dexamethasone to area, after education and skin inspection.    Personal Factors and Comorbidities Comorbidity 2;Social Background    Comorbidities history chronic L shoulder pain, history of heart disease    PT Frequency 2x / week    PT Duration 12 weeks    PT Treatment/Interventions ADLs/Self Care Home Management;Cryotherapy;Electrical Stimulation;Moist Heat;Therapeutic activities;Therapeutic exercise;Neuromuscular re-education;Patient/family education;Scar mobilization;Passive range of motion;Dry needling;Taping;Vasopneumatic Device;Joint Manipulations    PT Next Visit Plan Continue shoulder strengthening; progress HEP per surgical protocol.  Surgery date  12/19/20, phase 3 to 02/13/21.    PT Home Exercise Plan Access Code: [email or web address]    Consulted and Agree with Plan of Care Patient             Patient will benefit from skilled therapeutic intervention in order to improve the following deficits and impairments:  Decreased endurance, Decreased skin integrity, Increased muscle spasms, Decreased range of motion,  Decreased scar mobility, Decreased activity tolerance, Decreased strength, Increased fascial restricitons, Impaired flexibility, Impaired UE functional use, Pain  Visit Diagnosis: Acute pain of right shoulder  Stiffness of right shoulder, not elsewhere classified  Muscle weakness (generalized)     Problem List Patient Active Problem List   Diagnosis Date Noted   CAD (coronary artery disease) 01/07/2021   Chronic right shoulder pain 07/17/2020   Left tennis elbow 07/17/2020   Carpal tunnel syndrome on both sides 07/17/2020   Atypical chest pain 10/18/2019   Hemoptysis 10/18/2012   Preventative health care 04/07/2011   NUMBNESS, HAND 12/27/2007   HYPERLIPIDEMIA 12/24/2006   ALLERGIC RHINITIS 12/24/2006   GERD 12/24/2006   TB SKIN TEST, POSITIVE 12/24/2006    Rennie Natter, PT, DPT  03/05/2021, 9:08 AM  [facility] [address]  [place], [place], [address] Phone: [phone]   Fax:  [phone]  Name: Pietro Bonura MRN: [id number] Date of Birth: Jan 03, 1961

## 2021-03-05 NOTE — Patient Instructions (Signed)
° ° °  IONTOPHORESIS PATIENT PRECAUTIONS & CONTRAINDICATIONS:  Redness under one or both electrodes can occur.  This characterized by a uniform redness that usually disappears within 12 hours of treatment. Small pinhead size blisters may result in response to the drug.  Contact your physician if the problem persists more than 24 hours. On rare occasions, iontophoresis therapy can result in temporary skin reactions such as rash, inflammation, irritation or burns.  The skin reactions may be the result of individual sensitivity to the ionic solution used, the condition of the skin at the start of treatment, reaction to the materials in the electrodes, allergies or sensitivity to dexamethasone, or a poor connection between the patch and your skin.  Discontinue using iontophoresis if you have any of these reactions and report to your therapist. Remove the Patch or electrodes if you have any undue sensation of pain or burning during the treatment and report discomfort to your therapist. Tell your Therapist if you have had known adverse reactions to the application of electrical current. Approximate treatment time is 4-6 hours.  Remove the patch after 6 hours. The Patch can be worn during normal activity, however excessive motion where the electrodes have been placed can cause poor contact between the skin and the electrode or uneven electrical current resulting in greater risk of skin irritation. Keep out of the reach of children.   DO NOT use if you have a cardiac pacemaker or any other electrically sensitive implanted device. DO NOT use if you have a known sensitivity to dexamethasone. DO NOT use during Magnetic Resonance Imaging (MRI). DO NOT use over broken or compromised skin (e.g. sunburn, cuts, or acne) due to the increased risk of skin reaction. DO NOT SHAVE over the area to be treated:  To establish good contact between the Patch and the skin, excessive hair may be clipped. DO NOT place the  Patch or electrodes on or over your eyes, directly over your heart, or brain. DO NOT reuse the Patch or electrodes as this may cause burns to occur.   For questions, please contact your therapist at:  [facility] [place] [address]  [address] [place], [place], [address] Phone: [phone]   Fax:  [phone]  

## 2021-03-08 ENCOUNTER — Other Ambulatory Visit: Payer: Self-pay

## 2021-03-08 ENCOUNTER — Ambulatory Visit: Payer: Medicaid Other | Attending: Orthopaedic Surgery

## 2021-03-08 DIAGNOSIS — M25611 Stiffness of right shoulder, not elsewhere classified: Secondary | ICD-10-CM | POA: Insufficient documentation

## 2021-03-08 DIAGNOSIS — M25511 Pain in right shoulder: Secondary | ICD-10-CM | POA: Insufficient documentation

## 2021-03-08 DIAGNOSIS — R293 Abnormal posture: Secondary | ICD-10-CM | POA: Insufficient documentation

## 2021-03-08 DIAGNOSIS — M6281 Muscle weakness (generalized): Secondary | ICD-10-CM | POA: Diagnosis present

## 2021-03-08 NOTE — Therapy (Signed)
[facility] [address]  [place], [place], [address] Phone: [phone]   Fax:  [phone]  Physical Therapy Treatment  Patient Details  Name: Cristian Fuller MRN: [id number] Date of Birth: [birth year] Referring Provider (PT): Ophelia Charter   Encounter Date: 03/08/2021   PT End of Session - 03/08/21 0844     Visit Number 20    Number of Visits 24    Date for PT Re-Evaluation 03/19/21    Authorization Type [employer]    Authorization Time Period 02/01/21-03/20/21    PT Start Time 0802    PT Stop Time 2458    PT Time Calculation (min) 39 min    Activity Tolerance Patient tolerated treatment well;Patient limited by fatigue    Behavior During Therapy [facility] for tasks assessed/performed             Past Medical History:  Diagnosis Date   Allergic rhinitis    Hyperlipidemia    Left varicocele    history of left varicocele    Past Surgical History:  Procedure Laterality Date   BACK SURGERY     ESOPHAGOGASTRODUODENOSCOPY  2002/2007   LEFT HEART CATH AND CORONARY ANGIOGRAPHY N/A 10/24/2019   Procedure: LEFT HEART CATH AND CORONARY ANGIOGRAPHY;  Surgeon: Lorretta Harp, MD;  Location: [facility];  Service: Cardiovascular;  Laterality: N/A;   LEFT HEART CATH AND CORONARY ANGIOGRAPHY N/A 01/07/2021   Procedure: LEFT HEART CATH AND CORONARY ANGIOGRAPHY;  Surgeon: Lorretta Harp, MD;  Location: [facility];  Service: Cardiovascular;  Laterality: N/A;   SHOULDER ARTHROSCOPY WITH SUBACROMIAL DECOMPRESSION AND BICEP TENDON REPAIR Right 12/19/2020   Procedure: SHOULDER ARTHROSCOPY WITH SUBACROMIAL DECOMPRESSION AND BICEP TENDON REPAIR AND DISTAL CLAVICLE EXCISION;  Surgeon: Hiram Gash, MD;  Location: [facility];  Service: Orthopedics;  Laterality: Right;    There were no vitals filed for this visit.   Subjective Assessment - 03/08/21 0802     Subjective Pt reports some  anterior shoulder aching when reaching behind his back.    Currently in Pain? Yes    Pain Score 2     Pain Location Shoulder    Pain Orientation Right    Pain Descriptors / Indicators Aching    Pain Type Surgical pain                [facility] PT Assessment - 03/08/21 0001       AROM   Right Shoulder Flexion 165 Degrees   stopping due to pain   Right Shoulder ABduction 130 Degrees   stops due to pain   Right Shoulder Internal Rotation --   FIR to L3   Right Shoulder External Rotation --   FER to T2                          [facility] Adult PT Treatment/Exercise - 03/08/21 0001       Shoulder Exercises: Sidelying   External Rotation Strengthening;Right;12 reps;Weights    External Rotation Weight (lbs) 3    Internal Rotation Strengthening;Right;15 reps;Weights    Internal Rotation Weight (lbs) 3      Shoulder Exercises: Standing   External Rotation Strengthening;Right;Theraband;20 reps    Theraband Level (Shoulder External Rotation) Level 1 (Yellow);Level 3 (Green)    External Rotation Limitations 1 set at 90/90, full ER with YTB; 1 set isometric step out with GTB 10x5"    Internal  Rotation Strengthening;Right;10 reps;Theraband    Theraband Level (Shoulder Internal Rotation) Level 3 (Green)    Internal Rotation Limitations isometric step out with 5 sec hold    Extension Strengthening;Both;20 reps;Theraband    Theraband Level (Shoulder Extension) Level 3 (Green)    Extension Limitations TB attached to wand for wide grip    Row Strengthening;Both;20 reps;Theraband    Theraband Level (Shoulder Row) Level 3 (Green)    Row Limitations TB attached to wand for wide grip to increase retraction      Shoulder Exercises: ROM/Strengthening   UBE (Upper Arm Bike) L2.0 33min fwd/back                       PT Short Term Goals - 01/24/21 0846       PT SHORT TERM GOAL #1   Title Patient to be independent with initial HEP.    Status Achieved      PT SHORT  TERM GOAL #2   Title Patient will demonstrate improved R shoulder ROM to 140 deg foward flexion without pain.    Status Achieved               PT Long Term Goals - 03/08/21 0816       PT LONG TERM GOAL #1   Title Patient to be independent with advanced HEP to progress to gym based exercise program.    Status On-going      PT LONG TERM GOAL #2   Title Patient to demonstrate R UE strength >/=4+/5  without pain to be able to start gentle resistance training    Status On-going      PT LONG TERM GOAL #3   Title Patient to demonstrate R shoulder AROM WFL and without pain limiting.    Status On-going   pt is still limited by pain with all motions toward the end range, ROM is progressing toward functional limits     PT LONG TERM GOAL #4   Title Patient will report >48 on FOTO to demonstrate improved function of R shoulder    Status Achieved                   Plan - 03/08/21 0845     Clinical Impression Statement Pt shows improvement in ROM today but still limited by pain  in the end ranges. Focused on the RTC stabilizing muscles today with interventions. Cues needed with the 90/90 ER to keep elbow in line with the shoulder and to prevent compensation with the wrist in sidelying ER/IR. Pt responded well to the session, only noting muscle fatigue post session.    Personal Factors and Comorbidities Comorbidity 2;Social Background    Comorbidities history chronic L shoulder pain, history of heart disease    PT Frequency 2x / week    PT Duration 12 weeks    PT Treatment/Interventions ADLs/Self Care Home Management;Cryotherapy;Electrical Stimulation;Moist Heat;Therapeutic activities;Therapeutic exercise;Neuromuscular re-education;Patient/family education;Scar mobilization;Passive range of motion;Dry needling;Taping;Vasopneumatic Device;Joint Manipulations    PT Next Visit Plan Continue shoulder strengthening; progress HEP per surgical protocol.  Surgery date 12/19/20, phase 3 to  02/13/21.    PT Home Exercise Plan Access Code: [email or web address]    Consulted and Agree with Plan of Care Patient             Patient will benefit from skilled therapeutic intervention in order to improve the following deficits and impairments:  Decreased endurance, Decreased skin integrity, Increased muscle spasms, Decreased range of motion, Decreased scar mobility,  Decreased activity tolerance, Decreased strength, Increased fascial restricitons, Impaired flexibility, Impaired UE functional use, Pain  Visit Diagnosis: Acute pain of right shoulder  Stiffness of right shoulder, not elsewhere classified  Muscle weakness (generalized)  Abnormal posture     Problem List Patient Active Problem List   Diagnosis Date Noted   CAD (coronary artery disease) 01/07/2021   Chronic right shoulder pain 07/17/2020   Left tennis elbow 07/17/2020   Carpal tunnel syndrome on both sides 07/17/2020   Atypical chest pain 10/18/2019   Hemoptysis 10/18/2012   Preventative health care 04/07/2011   NUMBNESS, HAND 12/27/2007   HYPERLIPIDEMIA 12/24/2006   ALLERGIC RHINITIS 12/24/2006   GERD 12/24/2006   TB SKIN TEST, POSITIVE 12/24/2006    Artist Pais, PTA 03/08/2021, 9:44 AM  [facility] [address]  [place], [place], [address] Phone: [phone]   Fax:  [phone]  Name: Cristian Fuller MRN: [id number] Date of Birth: [birth year]

## 2021-03-11 ENCOUNTER — Other Ambulatory Visit: Payer: Self-pay

## 2021-03-11 ENCOUNTER — Ambulatory Visit: Payer: Medicaid Other

## 2021-03-11 DIAGNOSIS — M25611 Stiffness of right shoulder, not elsewhere classified: Secondary | ICD-10-CM

## 2021-03-11 DIAGNOSIS — M25511 Pain in right shoulder: Secondary | ICD-10-CM | POA: Diagnosis not present

## 2021-03-11 DIAGNOSIS — M6281 Muscle weakness (generalized): Secondary | ICD-10-CM

## 2021-03-11 DIAGNOSIS — R293 Abnormal posture: Secondary | ICD-10-CM

## 2021-03-11 NOTE — Therapy (Signed)
[facility] [address]  [place], [place], [address] Phone: [phone]   Fax:  [phone]  Physical Therapy Treatment  Patient Details  Name: Cristian Fuller MRN: [id number] Date of Birth: January 24, 1961 Referring Provider (PT): Cristian Fuller   Encounter Date: 03/11/2021   PT End of Session - 03/11/21 0832     Visit Number 21    Number of Visits 24    Date for PT Re-Evaluation 03/19/21    Authorization Type [employer]    Authorization Time Period 02/01/21-03/20/21    PT Start Time 0804    PT Stop Time 9678    PT Time Calculation (min) 39 min    Activity Tolerance Patient tolerated treatment well;Patient limited by fatigue    Behavior During Therapy [facility] for tasks assessed/performed             Past Medical History:  Diagnosis Date   Allergic rhinitis    Hyperlipidemia    Left varicocele    history of left varicocele    Past Surgical History:  Procedure Laterality Date   BACK SURGERY     ESOPHAGOGASTRODUODENOSCOPY  2002/2007   LEFT HEART CATH AND CORONARY ANGIOGRAPHY N/A 10/24/2019   Procedure: LEFT HEART CATH AND CORONARY ANGIOGRAPHY;  Surgeon: Cristian Harp, MD;  Location: [facility];  Service: Cardiovascular;  Laterality: N/A;   LEFT HEART CATH AND CORONARY ANGIOGRAPHY N/A 01/07/2021   Procedure: LEFT HEART CATH AND CORONARY ANGIOGRAPHY;  Surgeon: Cristian Harp, MD;  Location: [facility];  Service: Cardiovascular;  Laterality: N/A;   SHOULDER ARTHROSCOPY WITH SUBACROMIAL DECOMPRESSION AND BICEP TENDON REPAIR Right 12/19/2020   Procedure: SHOULDER ARTHROSCOPY WITH SUBACROMIAL DECOMPRESSION AND BICEP TENDON REPAIR AND DISTAL CLAVICLE EXCISION;  Surgeon: Cristian Gash, MD;  Location: [facility];  Service: Orthopedics;  Laterality: Right;    There were no vitals filed for this visit.   Subjective Assessment - 03/11/21 0807     Subjective Doing good today, a  little stiff, no shoulder pain unless reaching across the body.    Currently in Pain? No/denies                [facility] PT Assessment - 03/11/21 0001       Assessment   Medical Diagnosis M75.41 (ICD-10-CM) - Impingement syndrome of right shoulder  S42.031A (ICD-10-CM) - Displaced fracture of lateral end of right clavicle  M75.21 (ICD-10-CM) - Biceps tendonitis on right    Referring Provider (PT) Cristian Fuller, Cristian Fuller    Onset Date/Surgical Date 12/19/20      AROM   Right Shoulder Flexion 165 Degrees    Right Shoulder ABduction 130 Degrees    Right Shoulder Internal Rotation --   FIR to L3   Right Shoulder External Rotation --   FER to T2     Strength   Right Shoulder Flexion 4/5    Right Shoulder ABduction 4/5    Right Shoulder Internal Rotation 4/5    Right Shoulder External Rotation 4+/5                           [facility] Adult PT Treatment/Exercise - 03/11/21 0001       Shoulder Exercises: Prone   Flexion Strengthening;Right;Weights;20 reps    Flexion Weight (lbs) 3    Extension Strengthening;Right;20 reps;Weights    Extension Weight (lbs) 3    Horizontal ABduction 1 Strengthening;Right;20 reps;Weights  Horizontal ABduction 1 Weight (lbs) 3      Shoulder Exercises: Standing   External Rotation Strengthening;Right;10 reps;Theraband   5 sec hold   Theraband Level (Shoulder External Rotation) Level 3 (Green)    External Rotation Limitations isometric step out    Internal Rotation Strengthening;Right;10 reps;Theraband   5 sec hold   Theraband Level (Shoulder Internal Rotation) Level 3 (Green)    Internal Rotation Limitations isometric step out    Flexion Strengthening;Right;Theraband;5 reps   5 sec hold   Theraband Level (Shoulder Flexion) Level 3 (Green)    Flexion Limitations isometric step out    ABduction Strengthening;Right;5 reps;Theraband   5 sec hold   Theraband Level (Shoulder ABduction) Level 3 (Green)    ABduction Limitations isometric step out       Shoulder Exercises: ROM/Strengthening   UBE (Upper Arm Bike) L2.0 54min fwd/back    Other ROM/Strengthening Exercises standing Ys against wall 15 reps      Manual Therapy   Manual Therapy Soft tissue mobilization    Soft tissue mobilization STM to R biceps, teres group, infraspinatus                       PT Short Term Goals - 01/24/21 0846       PT SHORT TERM GOAL #1   Title Patient to be independent with initial HEP.    Status Achieved      PT SHORT TERM GOAL #2   Title Patient will demonstrate improved R shoulder ROM to 140 deg foward flexion without pain.    Status Achieved               PT Long Term Goals - 03/11/21 0815       PT LONG TERM GOAL #1   Title Patient to be independent with advanced HEP to progress to gym based exercise program.    Status On-going      PT LONG TERM GOAL #2   Title Patient to demonstrate R UE strength >/=4+/5  without pain to be able to start gentle resistance training    Status On-going   grossly 4/5 in R shoulder     PT LONG TERM GOAL #3   Title Patient to demonstrate R shoulder AROM WFL and without pain limiting.    Status On-going   pt is still limited by pain with all motions toward the end range, ROM is progressing toward functional limits     PT LONG TERM GOAL #4   Title Patient will report >48 on FOTO to demonstrate improved function of R shoulder    Status Achieved                   Plan - 03/11/21 0277     Clinical Impression Statement Pt remains limited by [place] joint pain with resisted concentric shoulder flexion and ABD. Functionally he still reports being limited with pushing motions and still having to reach across the body. He still get very fatigued in the shoulder after about 2-3 exercises, indicating lack of muscular endurance. Functional ROM remains unchanged from the last visit. Even though he has improvement in pain levels he still is limited with his strength and ROM with ADLs due to pain  in the end range of motion.    Personal Factors and Comorbidities Comorbidity 2;Social Background    Comorbidities history chronic L shoulder pain, history of heart disease    PT Frequency 2x / week    PT  Duration 12 weeks    PT Treatment/Interventions ADLs/Self Care Home Management;Cryotherapy;Electrical Stimulation;Moist Heat;Therapeutic activities;Therapeutic exercise;Neuromuscular re-education;Patient/family education;Scar mobilization;Passive range of motion;Dry needling;Taping;Vasopneumatic Device;Joint Manipulations    PT Next Visit Plan Continue shoulder strengthening; progress HEP per surgical protocol.  Surgery date 12/19/20, phase 3 to 02/13/21.    PT Home Exercise Plan Access Code: [email or web address]    Consulted and Agree with Plan of Care Patient             Patient will benefit from skilled therapeutic intervention in order to improve the following deficits and impairments:  Decreased endurance, Decreased skin integrity, Increased muscle spasms, Decreased range of motion, Decreased scar mobility, Decreased activity tolerance, Decreased strength, Increased fascial restricitons, Impaired flexibility, Impaired UE functional use, Pain  Visit Diagnosis: Acute pain of right shoulder  Stiffness of right shoulder, not elsewhere classified  Muscle weakness (generalized)  Abnormal posture     Problem List Patient Active Problem List   Diagnosis Date Noted   CAD (coronary artery disease) 01/07/2021   Chronic right shoulder pain 07/17/2020   Left tennis elbow 07/17/2020   Carpal tunnel syndrome on both sides 07/17/2020   Atypical chest pain 10/18/2019   Hemoptysis 10/18/2012   Preventative health care 04/07/2011   NUMBNESS, HAND 12/27/2007   HYPERLIPIDEMIA 12/24/2006   ALLERGIC RHINITIS 12/24/2006   GERD 12/24/2006   TB SKIN TEST, POSITIVE 12/24/2006    Artist Cristian Fuller, PTA 03/11/2021, 10:55 AM  [facility] [address]  [place], [place], [address] Phone: [phone]   Fax:  [phone]  Name: Taim Wurm MRN: [id number] Date of Birth: Aug 27, 1960

## 2021-03-12 ENCOUNTER — Encounter: Payer: Medicaid Other | Admitting: Physical Therapy

## 2021-03-15 ENCOUNTER — Other Ambulatory Visit: Payer: Self-pay

## 2021-03-15 ENCOUNTER — Ambulatory Visit: Payer: Medicaid Other

## 2021-03-15 DIAGNOSIS — M25511 Pain in right shoulder: Secondary | ICD-10-CM | POA: Diagnosis not present

## 2021-03-15 DIAGNOSIS — M6281 Muscle weakness (generalized): Secondary | ICD-10-CM

## 2021-03-15 DIAGNOSIS — R293 Abnormal posture: Secondary | ICD-10-CM

## 2021-03-15 DIAGNOSIS — M25611 Stiffness of right shoulder, not elsewhere classified: Secondary | ICD-10-CM

## 2021-03-15 NOTE — Therapy (Signed)
[facility] [address]  [place], [place], [address] Phone: [phone]   Fax:  [phone]  Physical Therapy Treatment  Patient Details  Name: Cristian Fuller MRN: [id number] Date of Birth: 06-26-1960 Referring Provider (PT): Ophelia Charter   Encounter Date: 03/15/2021   PT End of Session - 03/15/21 0855     Visit Number 22    Number of Visits 24    Date for PT Re-Evaluation 03/19/21    Authorization Type [employer]    Authorization Time Period 02/01/21-03/20/21    PT Start Time 0801    PT Stop Time 2423    PT Time Calculation (min) 54 min    Activity Tolerance Patient tolerated treatment well    Behavior During Therapy [facility] for tasks assessed/performed             Past Medical History:  Diagnosis Date   Allergic rhinitis    Hyperlipidemia    Left varicocele    history of left varicocele    Past Surgical History:  Procedure Laterality Date   BACK SURGERY     ESOPHAGOGASTRODUODENOSCOPY  2002/2007   LEFT HEART CATH AND CORONARY ANGIOGRAPHY N/A 10/24/2019   Procedure: LEFT HEART CATH AND CORONARY ANGIOGRAPHY;  Surgeon: Lorretta Harp, MD;  Location: [facility];  Service: Cardiovascular;  Laterality: N/A;   LEFT HEART CATH AND CORONARY ANGIOGRAPHY N/A 01/07/2021   Procedure: LEFT HEART CATH AND CORONARY ANGIOGRAPHY;  Surgeon: Lorretta Harp, MD;  Location: [facility];  Service: Cardiovascular;  Laterality: N/A;   SHOULDER ARTHROSCOPY WITH SUBACROMIAL DECOMPRESSION AND BICEP TENDON REPAIR Right 12/19/2020   Procedure: SHOULDER ARTHROSCOPY WITH SUBACROMIAL DECOMPRESSION AND BICEP TENDON REPAIR AND DISTAL CLAVICLE EXCISION;  Surgeon: Hiram Gash, MD;  Location: [facility];  Service: Orthopedics;  Laterality: Right;    There were no vitals filed for this visit.   Subjective Assessment - 03/15/21 0804     Subjective Pain comes now only when reaching in awkward  positions or sleeping on the shoulder.    Currently in Pain? No/denies                               [facility] Adult PT Treatment/Exercise - 03/15/21 0001       Shoulder Exercises: Standing   External Rotation Strengthening;Right;Theraband   3x10" hold   Theraband Level (Shoulder External Rotation) Level 3 (Green)    External Rotation Limitations isometric step out    Internal Rotation AAROM;Both;10 reps    Internal Rotation Limitations with cane, behind back zip the pants, 10 additional reps    Extension AAROM;Both;10 reps    Extension Limitations with cane, behind back    Row Strengthening;Both;20 reps    Row Limitations 2x10, inverted row on TRX    Other Standing Exercises serratus slide on wall with YTB 10 reps    Other Standing Exercises isometric IR with GTB 3x10" hold      Shoulder Exercises: ROM/Strengthening   UBE (Upper Arm Bike) L3.0 16min fwd/back    Other ROM/Strengthening Exercises cabinet reaching with 2lb weight 2x10, up and across      Moist Heat Therapy   Number Minutes Moist Heat 10 Minutes    Moist Heat Location Shoulder      Manual Therapy   Manual Therapy Soft tissue mobilization;Myofascial release    Soft tissue mobilization STM to R biceps, ant  deltoid    Myofascial Release TPR with PROM to R biceps and ant deltoid                       PT Short Term Goals - 01/24/21 0846       PT SHORT TERM GOAL #1   Title Patient to be independent with initial HEP.    Status Achieved      PT SHORT TERM GOAL #2   Title Patient will demonstrate improved R shoulder ROM to 140 deg foward flexion without pain.    Status Achieved               PT Long Term Goals - 03/11/21 0815       PT LONG TERM GOAL #1   Title Patient to be independent with advanced HEP to progress to gym based exercise program.    Status On-going      PT LONG TERM GOAL #2   Title Patient to demonstrate R UE strength >/=4+/5  without pain to be able to  start gentle resistance training    Status On-going   grossly 4/5 in R shoulder     PT LONG TERM GOAL #3   Title Patient to demonstrate R shoulder AROM WFL and without pain limiting.    Status On-going   pt is still limited by pain with all motions toward the end range, ROM is progressing toward functional limits     PT LONG TERM GOAL #4   Title Patient will report >48 on FOTO to demonstrate improved function of R shoulder    Status Achieved                   Plan - 03/15/21 2951     Clinical Impression Statement Pt feels at this point he is ready to transition to HEP due to his progress. He has improved pain levels along the anterior shoulder, even when reaching behind the back. Continued progressing strengthening of the scapular stabilizers as well as functional movements of the shoulder. Towards the end of the session, he did have soreness, which was addressed with STM.    Personal Factors and Comorbidities Comorbidity 2;Social Background    Comorbidities history chronic L shoulder pain, history of heart disease    PT Frequency 2x / week    PT Duration 12 weeks    PT Treatment/Interventions ADLs/Self Care Home Management;Cryotherapy;Electrical Stimulation;Moist Heat;Therapeutic activities;Therapeutic exercise;Neuromuscular re-education;Patient/family education;Scar mobilization;Passive range of motion;Dry needling;Taping;Vasopneumatic Device;Joint Manipulations    PT Next Visit Plan Continue shoulder strengthening; progress HEP per surgical protocol.  Surgery date 12/19/20, phase 3 to 02/13/21.    PT Home Exercise Plan Access Code: [email or web address]    Consulted and Agree with Plan of Care Patient             Patient will benefit from skilled therapeutic intervention in order to improve the following deficits and impairments:  Decreased endurance, Decreased skin integrity, Increased muscle spasms, Decreased range of motion, Decreased scar mobility, Decreased activity tolerance,  Decreased strength, Increased fascial restricitons, Impaired flexibility, Impaired UE functional use, Pain  Visit Diagnosis: Acute pain of right shoulder  Stiffness of right shoulder, not elsewhere classified  Muscle weakness (generalized)  Abnormal posture     Problem List Patient Active Problem List   Diagnosis Date Noted   CAD (coronary artery disease) 01/07/2021   Chronic right shoulder pain 07/17/2020   Left tennis elbow 07/17/2020   Carpal tunnel syndrome on both sides 07/17/2020  Atypical chest pain 10/18/2019   Hemoptysis 10/18/2012   Preventative health care 04/07/2011   NUMBNESS, HAND 12/27/2007   HYPERLIPIDEMIA 12/24/2006   ALLERGIC RHINITIS 12/24/2006   GERD 12/24/2006   TB SKIN TEST, POSITIVE 12/24/2006    Artist Pais, PTA 03/15/2021, 10:01 AM  [facility] [address]  [place], [place], [address] Phone: [phone]   Fax:  [phone]  Name: Cristian Fuller MRN: [id number] Date of Birth: [birth year]

## 2021-03-19 ENCOUNTER — Other Ambulatory Visit: Payer: Self-pay

## 2021-03-19 ENCOUNTER — Ambulatory Visit: Payer: Medicaid Other | Admitting: Physical Therapy

## 2021-03-19 ENCOUNTER — Encounter: Payer: Self-pay | Admitting: Physical Therapy

## 2021-03-19 DIAGNOSIS — M25511 Pain in right shoulder: Secondary | ICD-10-CM | POA: Diagnosis not present

## 2021-03-19 DIAGNOSIS — M25611 Stiffness of right shoulder, not elsewhere classified: Secondary | ICD-10-CM

## 2021-03-19 DIAGNOSIS — M6281 Muscle weakness (generalized): Secondary | ICD-10-CM

## 2021-03-19 NOTE — Therapy (Signed)
[facility] [address]  [place], [place], [address] Phone: [phone]   Fax:  [phone]  Physical Therapy Treatment Progress Note Reporting Period 02/05/21 to 03/19/2021  See note below for Objective Data and Assessment of Progress/Goals.      Patient Details  Name: Cristian Fuller MRN: [id number] Date of Birth: 1960-12-19 Referring Provider (PT): Ophelia Charter   Encounter Date: 03/19/2021   PT End of Session - 03/19/21 0804     Visit Number 23    Number of Visits 27    Date for PT Re-Evaluation 04/02/21    Authorization Type [employer]    Authorization Time Period --    PT Start Time 0804    PT Stop Time 5102    PT Time Calculation (min) 51 min    Activity Tolerance Patient tolerated treatment well    Behavior During Therapy [facility] for tasks assessed/performed             Past Medical History:  Diagnosis Date   Allergic rhinitis    Hyperlipidemia    Left varicocele    history of left varicocele    Past Surgical History:  Procedure Laterality Date   BACK SURGERY     ESOPHAGOGASTRODUODENOSCOPY  2002/2007   LEFT HEART CATH AND CORONARY ANGIOGRAPHY N/A 10/24/2019   Procedure: LEFT HEART CATH AND CORONARY ANGIOGRAPHY;  Surgeon: Lorretta Harp, MD;  Location: [facility];  Service: Cardiovascular;  Laterality: N/A;   LEFT HEART CATH AND CORONARY ANGIOGRAPHY N/A 01/07/2021   Procedure: LEFT HEART CATH AND CORONARY ANGIOGRAPHY;  Surgeon: Lorretta Harp, MD;  Location: [facility];  Service: Cardiovascular;  Laterality: N/A;   SHOULDER ARTHROSCOPY WITH SUBACROMIAL DECOMPRESSION AND BICEP TENDON REPAIR Right 12/19/2020   Procedure: SHOULDER ARTHROSCOPY WITH SUBACROMIAL DECOMPRESSION AND BICEP TENDON REPAIR AND DISTAL CLAVICLE EXCISION;  Surgeon: Hiram Gash, MD;  Location: [facility];  Service: Orthopedics;  Laterality: Right;    There were no vitals filed for  this visit.   Subjective Assessment - 03/19/21 0805     Subjective Pt. reports shoulder is doing well.    Currently in Pain? Yes    Pain Score 1     Pain Location Shoulder    Pain Orientation Right                [facility] PT Assessment - 03/19/21 0001       Assessment   Medical Diagnosis M75.41 (ICD-10-CM) - Impingement syndrome of right shoulder  S42.031A (ICD-10-CM) - Displaced fracture of lateral end of right clavicle  M75.21 (ICD-10-CM) - Biceps tendonitis on right    Referring Provider (PT) Griffin Basil, Dax    Onset Date/Surgical Date 12/19/20      Observation/Other Assessments   Focus on Therapeutic Outcomes (FOTO)  61      AROM   Overall AROM  Within functional limits for tasks performed    Overall AROM Comments Shoulder ROM WNL but reports sharp pain with R should abduction and IR      Strength   Right Shoulder Flexion 4+/5    Right Shoulder ABduction 4/5    Right Shoulder Internal Rotation 4+/5    Right Shoulder External Rotation 4+/5                           [facility] Adult PT Treatment/Exercise - 03/19/21 0001       Shoulder  Exercises: ROM/Strengthening   UBE (Upper Arm Bike) L3.0 65mn fwd/back    Lat Pull Limitations 15# 2 x 10   no pain when cued to engage posterior shoulder muscles   Cybex Row Limitations 20# 2 x 10    Wall Pushups 20 reps      Modalities   Modalities Moist Heat      Moist Heat Therapy   Number Minutes Moist Heat 10 Minutes    Moist Heat Location Shoulder      Manual Therapy   Manual Therapy Soft tissue mobilization    Soft tissue mobilization STM to R biceps, ant deltoid, IASTM with s/s tools to R pec attachment                       PT Short Term Goals - 01/24/21 0846       PT SHORT TERM GOAL #1   Title Patient to be independent with initial HEP.    Status Achieved      PT SHORT TERM GOAL #2   Title Patient will demonstrate improved R shoulder ROM to 140 deg foward flexion without pain.    Status  Achieved               PT Long Term Goals - 03/19/21 0816       PT LONG TERM GOAL #1   Title Patient to be independent with advanced HEP to progress to gym based exercise program.    Status Achieved   03/19/21- met for current     PT LONG TERM GOAL #2   Title Patient to demonstrate R UE strength >/=4+/5  without pain to be able to start gentle resistance training    Status Partially Met   03/19/21- 4/5 R shoulder abduction, all other 4+/5   Target Date 04/02/21      PT LONG TERM GOAL #3   Title Patient to demonstrate R shoulder AROM WFL and without pain limiting.    Status Partially Met   03/19/21- AROM WNL but still has pain at end-range abduction and IR   Target Date 04/02/21      PT LONG TERM GOAL #4   Title Patient will report >48 on FOTO to demonstrate improved function of R shoulder    Status Achieved   03/19/21- 61%     PT LONG TERM GOAL #5   Title Pt. will be able to lift 10lbs to overhead shelf with both hands without increased R shoulder pain.    Time 2    Period Weeks    Status New    Target Date 04/02/21      Additional Long Term Goals   Additional Long Term Goals Yes      PT LONG TERM GOAL #6   Title Patient will be able to pick up 25lb weight and place on counter without increased R shoulder pain.    Time 2    Period Weeks    Status New    Target Date 04/02/21                   Plan - 03/19/21 0913     Clinical Impression Statement Pt. reports more pain today with R shoulder abduction and IR, increaing to 9/10 with these movements.  He is still having difficulty with work activities and lifting.  His FOTO has improved to 61%.  Noted tightness/banding today in R pectoralis.  He would benefit from additional 2 weeks of physical therapy  to continue progress and transition to more of resistance based HEP to continue muscle strengthening.    Personal Factors and Comorbidities Comorbidity 2;Social Background    Comorbidities history chronic L shoulder  pain, history of heart disease    PT Frequency 2x / week    PT Duration 12 weeks    PT Treatment/Interventions ADLs/Self Care Home Management;Cryotherapy;Electrical Stimulation;Moist Heat;Therapeutic activities;Therapeutic exercise;Neuromuscular re-education;Patient/family education;Scar mobilization;Passive range of motion;Dry needling;Taping;Vasopneumatic Device;Joint Manipulations    PT Next Visit Plan Continue shoulder strengthening; progress HEP per surgical protocol.  Surgery date 12/19/20, phase 3 to 02/13/21.    PT Home Exercise Plan Access Code: [email or web address]    Consulted and Agree with Plan of Care Patient             Patient will benefit from skilled therapeutic intervention in order to improve the following deficits and impairments:  Decreased endurance, Decreased skin integrity, Increased muscle spasms, Decreased range of motion, Decreased scar mobility, Decreased activity tolerance, Decreased strength, Increased fascial restricitons, Impaired flexibility, Impaired UE functional use, Pain  Visit Diagnosis: Acute pain of right shoulder  Stiffness of right shoulder, not elsewhere classified  Muscle weakness (generalized)     Problem List Patient Active Problem List   Diagnosis Date Noted   CAD (coronary artery disease) 01/07/2021   Chronic right shoulder pain 07/17/2020   Left tennis elbow 07/17/2020   Carpal tunnel syndrome on both sides 07/17/2020   Atypical chest pain 10/18/2019   Hemoptysis 10/18/2012   Preventative health care 04/07/2011   NUMBNESS, HAND 12/27/2007   HYPERLIPIDEMIA 12/24/2006   ALLERGIC RHINITIS 12/24/2006   GERD 12/24/2006   TB SKIN TEST, POSITIVE 12/24/2006    Rennie Natter, PT 03/19/2021, 11:33 AM  [facility] [address]  [place], [place], [address] Phone: [phone]   Fax:  [phone]  Name: Cristian Fuller MRN: [id number] Date of Birth: 03/24/1960

## 2021-03-22 ENCOUNTER — Other Ambulatory Visit: Payer: Self-pay

## 2021-03-22 ENCOUNTER — Ambulatory Visit: Payer: Medicaid Other

## 2021-03-22 DIAGNOSIS — M25611 Stiffness of right shoulder, not elsewhere classified: Secondary | ICD-10-CM

## 2021-03-22 DIAGNOSIS — M25511 Pain in right shoulder: Secondary | ICD-10-CM | POA: Diagnosis not present

## 2021-03-22 DIAGNOSIS — M6281 Muscle weakness (generalized): Secondary | ICD-10-CM

## 2021-03-22 DIAGNOSIS — R293 Abnormal posture: Secondary | ICD-10-CM

## 2021-03-22 NOTE — Therapy (Signed)
[facility] [address]  [place], [place], [address] Phone: [phone]   Fax:  [phone]  Physical Therapy Treatment  Patient Details  Name: Cristian Fuller MRN: [id number] Date of Birth: 12-15-1960 Referring Provider (PT): Ophelia Charter   Encounter Date: 03/22/2021   PT End of Session - 03/22/21 0846     Visit Number 24    Number of Visits 27    Date for PT Re-Evaluation 04/02/21    Authorization Type [employer]    PT Start Time 0800    PT Stop Time 3154    PT Time Calculation (min) 43 min    Activity Tolerance Patient tolerated treatment well    Behavior During Therapy [facility] for tasks assessed/performed             Past Medical History:  Diagnosis Date   Allergic rhinitis    Hyperlipidemia    Left varicocele    history of left varicocele    Past Surgical History:  Procedure Laterality Date   BACK SURGERY     ESOPHAGOGASTRODUODENOSCOPY  2002/2007   LEFT HEART CATH AND CORONARY ANGIOGRAPHY N/A 10/24/2019   Procedure: LEFT HEART CATH AND CORONARY ANGIOGRAPHY;  Surgeon: Lorretta Harp, MD;  Location: [facility];  Service: Cardiovascular;  Laterality: N/A;   LEFT HEART CATH AND CORONARY ANGIOGRAPHY N/A 01/07/2021   Procedure: LEFT HEART CATH AND CORONARY ANGIOGRAPHY;  Surgeon: Lorretta Harp, MD;  Location: [facility];  Service: Cardiovascular;  Laterality: N/A;   SHOULDER ARTHROSCOPY WITH SUBACROMIAL DECOMPRESSION AND BICEP TENDON REPAIR Right 12/19/2020   Procedure: SHOULDER ARTHROSCOPY WITH SUBACROMIAL DECOMPRESSION AND BICEP TENDON REPAIR AND DISTAL CLAVICLE EXCISION;  Surgeon: Hiram Gash, MD;  Location: [facility];  Service: Orthopedics;  Laterality: Right;    There were no vitals filed for this visit.   Subjective Assessment - 03/22/21 0802     Subjective Pt reports more shoulder aches today because of the rainy weather.    Currently in Pain? Yes     Pain Score 3     Pain Location Shoulder    Pain Orientation Right    Pain Descriptors / Indicators Aching;Sore    Pain Type Surgical pain                               [facility] Adult PT Treatment/Exercise - 03/22/21 0001       Shoulder Exercises: Sidelying   External Rotation Strengthening;Right;10 reps;Weights    External Rotation Weight (lbs) 3    ABduction Strengthening;Right;Weights;20 reps    ABduction Weight (lbs) 3      Shoulder Exercises: ROM/Strengthening   UBE (Upper Arm Bike) L3.5 51min fwd/back    Lat Pull Limitations 20lb  2 x 10    Cybex Row Limitations 15lb, 25lb 10 reps each, mid grip      Shoulder Exercises: Stretch   Other Shoulder Stretches pec stretch with both arms in ER behind head 3x20" in supine      Manual Therapy   Manual Therapy Soft tissue mobilization    Soft tissue mobilization STM to R biceps, ant deltoid, pec origin                       PT Short Term Goals - 01/24/21 0846       PT SHORT TERM GOAL #1   Title Patient  to be independent with initial HEP.    Status Achieved      PT SHORT TERM GOAL #2   Title Patient will demonstrate improved R shoulder ROM to 140 deg foward flexion without pain.    Status Achieved               PT Long Term Goals - 03/19/21 0816       PT LONG TERM GOAL #1   Title Patient to be independent with advanced HEP to progress to gym based exercise program.    Status Achieved   03/19/21- met for current     PT LONG TERM GOAL #2   Title Patient to demonstrate R UE strength >/=4+/5  without pain to be able to start gentle resistance training    Status Partially Met   03/19/21- 4/5 R shoulder abduction, all other 4+/5   Target Date 04/02/21      PT LONG TERM GOAL #3   Title Patient to demonstrate R shoulder AROM WFL and without pain limiting.    Status Partially Met   03/19/21- AROM WNL but still has pain at end-range abduction and IR   Target Date 04/02/21      PT LONG  TERM GOAL #4   Title Patient will report >48 on FOTO to demonstrate improved function of R shoulder    Status Achieved   03/19/21- 61%     PT LONG TERM GOAL #5   Title Pt. will be able to lift 10lbs to overhead shelf with both hands without increased R shoulder pain.    Time 2    Period Weeks    Status New    Target Date 04/02/21      Additional Long Term Goals   Additional Long Term Goals Yes      PT LONG TERM GOAL #6   Title Patient will be able to pick up 25lb weight and place on counter without increased R shoulder pain.    Time 2    Period Weeks    Status New    Target Date 04/02/21                   Plan - 03/22/21 0846     Clinical Impression Statement Pt reported more sore and aching in the R shoulder today. Started session with STM to the anterior shoulder, he did show improvements in behind back reaching  afterwards. I also explained to him why certain positions cause impingement of his shoulder. Able to progress exercises just fine with only shoulder fatigue shown afterwards.    Personal Factors and Comorbidities Comorbidity 2;Social Background    Comorbidities history chronic L shoulder pain, history of heart disease    PT Frequency 2x / week    PT Duration 12 weeks    PT Treatment/Interventions ADLs/Self Care Home Management;Cryotherapy;Electrical Stimulation;Moist Heat;Therapeutic activities;Therapeutic exercise;Neuromuscular re-education;Patient/family education;Scar mobilization;Passive range of motion;Dry needling;Taping;Vasopneumatic Device;Joint Manipulations    PT Next Visit Plan STM to biceps/ant deloid/pecs; Continue shoulder strengthening; progress HEP per surgical protocol.  Surgery date 12/19/20, phase 3 to 02/13/21.    PT Home Exercise Plan Access Code: [email or web address]    Consulted and Agree with Plan of Care Patient             Patient will benefit from skilled therapeutic intervention in order to improve the following deficits and impairments:   Decreased endurance, Decreased skin integrity, Increased muscle spasms, Decreased range of motion, Decreased scar mobility, Decreased activity tolerance, Decreased strength, Increased  fascial restricitons, Impaired flexibility, Impaired UE functional use, Pain  Visit Diagnosis: Acute pain of right shoulder  Stiffness of right shoulder, not elsewhere classified  Muscle weakness (generalized)  Abnormal posture     Problem List Patient Active Problem List   Diagnosis Date Noted   CAD (coronary artery disease) 01/07/2021   Chronic right shoulder pain 07/17/2020   Left tennis elbow 07/17/2020   Carpal tunnel syndrome on both sides 07/17/2020   Atypical chest pain 10/18/2019   Hemoptysis 10/18/2012   Preventative health care 04/07/2011   NUMBNESS, HAND 12/27/2007   HYPERLIPIDEMIA 12/24/2006   ALLERGIC RHINITIS 12/24/2006   GERD 12/24/2006   TB SKIN TEST, POSITIVE 12/24/2006    Artist Pais, PTA 03/22/2021, 8:57 AM  [facility] [address]  [place], [place], [address] Phone: [phone]   Fax:  [phone]  Name: Lemarcus Baggerly MRN: [id number] Date of Birth: 1960/06/07

## 2021-03-26 ENCOUNTER — Encounter: Payer: Self-pay | Admitting: Physical Therapy

## 2021-03-26 ENCOUNTER — Ambulatory Visit: Payer: Medicaid Other | Admitting: Physical Therapy

## 2021-03-26 ENCOUNTER — Other Ambulatory Visit: Payer: Self-pay

## 2021-03-26 DIAGNOSIS — M25511 Pain in right shoulder: Secondary | ICD-10-CM | POA: Diagnosis not present

## 2021-03-26 DIAGNOSIS — M6281 Muscle weakness (generalized): Secondary | ICD-10-CM

## 2021-03-26 DIAGNOSIS — M25611 Stiffness of right shoulder, not elsewhere classified: Secondary | ICD-10-CM

## 2021-03-26 NOTE — Therapy (Signed)
[facility] [address]  [place], [place], [address] Phone: [phone]   Fax:  [phone]  Physical Therapy Treatment  Patient Details  Name: Cristian Fuller MRN: [id number] Date of Birth: 07-Feb-1960 Referring Provider (PT): Ophelia Charter   Encounter Date: 03/26/2021   PT End of Session - 03/26/21 0812     Visit Number 25    Number of Visits 27    Date for PT Re-Evaluation 04/02/21    Authorization Type [employer]    PT Start Time 0805    PT Stop Time 9242    PT Time Calculation (min) 48 min    Activity Tolerance Patient tolerated treatment well    Behavior During Therapy [facility] for tasks assessed/performed             Past Medical History:  Diagnosis Date   Allergic rhinitis    Hyperlipidemia    Left varicocele    history of left varicocele    Past Surgical History:  Procedure Laterality Date   BACK SURGERY     ESOPHAGOGASTRODUODENOSCOPY  2002/2007   LEFT HEART CATH AND CORONARY ANGIOGRAPHY N/A 10/24/2019   Procedure: LEFT HEART CATH AND CORONARY ANGIOGRAPHY;  Surgeon: Lorretta Harp, MD;  Location: [facility];  Service: Cardiovascular;  Laterality: N/A;   LEFT HEART CATH AND CORONARY ANGIOGRAPHY N/A 01/07/2021   Procedure: LEFT HEART CATH AND CORONARY ANGIOGRAPHY;  Surgeon: Lorretta Harp, MD;  Location: [facility];  Service: Cardiovascular;  Laterality: N/A;   SHOULDER ARTHROSCOPY WITH SUBACROMIAL DECOMPRESSION AND BICEP TENDON REPAIR Right 12/19/2020   Procedure: SHOULDER ARTHROSCOPY WITH SUBACROMIAL DECOMPRESSION AND BICEP TENDON REPAIR AND DISTAL CLAVICLE EXCISION;  Surgeon: Hiram Gash, MD;  Location: [facility];  Service: Orthopedics;  Laterality: Right;    There were no vitals filed for this visit.   Subjective Assessment - 03/26/21 0813     Subjective Pt. reports shoulder is sore in mornings, also thinks weather is a factor.    Currently in  Pain? Yes    Pain Score 2     Pain Location Shoulder    Pain Orientation Right                               [facility] Adult PT Treatment/Exercise - 03/26/21 0001       Shoulder Exercises: Standing   Diagonals Strengthening;Right;20 reps;Theraband    Theraband Level (Shoulder Diagonals) Level 2 (Red)    Diagonals Limitations upward diagonal reaches with hip turn    Other Standing Exercises serratus slide on wall with RTB 2 x 10 reps    Other Standing Exercises lifting 6# weight to overhead counter, both hands 2 x 10      Shoulder Exercises: ROM/Strengthening   UBE (Upper Arm Bike) L3.5 72mn fwd/back    Lat Pull Limitations 20# x 20   reports easier today   Cybex Row Limitations 25# 2 x 10    Pushups 20 reps    Pushups Limitations 2 x 10 on counter,    Rhythmic Stabilization, Supine 2 x 1 min      Modalities   Modalities Moist Heat      Moist Heat Therapy   Number Minutes Moist Heat 10 Minutes    Moist Heat Location Shoulder      Manual Therapy   Manual Therapy Soft tissue mobilization;Myofascial release    Soft  tissue mobilization IASTM/STM to R pectoralis    Myofascial Release TPR R UT                       PT Short Term Goals - 01/24/21 0846       PT SHORT TERM GOAL #1   Title Patient to be independent with initial HEP.    Status Achieved      PT SHORT TERM GOAL #2   Title Patient will demonstrate improved R shoulder ROM to 140 deg foward flexion without pain.    Status Achieved               PT Long Term Goals - 03/19/21 0816       PT LONG TERM GOAL #1   Title Patient to be independent with advanced HEP to progress to gym based exercise program.    Status Achieved   03/19/21- met for current     PT LONG TERM GOAL #2   Title Patient to demonstrate R UE strength >/=4+/5  without pain to be able to start gentle resistance training    Status Partially Met   03/19/21- 4/5 R shoulder abduction, all other 4+/5   Target Date  04/02/21      PT LONG TERM GOAL #3   Title Patient to demonstrate R shoulder AROM WFL and without pain limiting.    Status Partially Met   03/19/21- AROM WNL but still has pain at end-range abduction and IR   Target Date 04/02/21      PT LONG TERM GOAL #4   Title Patient will report >48 on FOTO to demonstrate improved function of R shoulder    Status Achieved   03/19/21- 61%     PT LONG TERM GOAL #5   Title Pt. will be able to lift 10lbs to overhead shelf with both hands without increased R shoulder pain.    Time 2    Period Weeks    Status New    Target Date 04/02/21      Additional Long Term Goals   Additional Long Term Goals Yes      PT LONG TERM GOAL #6   Title Patient will be able to pick up 25lb weight and place on counter without increased R shoulder pain.    Time 2    Period Weeks    Status New    Target Date 04/02/21                   Plan - 03/26/21 0847     Clinical Impression Statement Cristian Fuller reported some fatigue and tightness with overhead movements today, but tolerated progression of exercises and reported weights felt much easier.  He was still challenged with lifting 6# weight but did not report significant increase in pain.  Reassured again that he will continue to improve as recovers from surgery.    Personal Factors and Comorbidities Comorbidity 2;Social Background    Comorbidities history chronic L shoulder pain, history of heart disease    PT Frequency 2x / week    PT Duration 12 weeks    PT Treatment/Interventions ADLs/Self Care Home Management;Cryotherapy;Electrical Stimulation;Moist Heat;Therapeutic activities;Therapeutic exercise;Neuromuscular re-education;Patient/family education;Scar mobilization;Passive range of motion;Dry needling;Taping;Vasopneumatic Device;Joint Manipulations    PT Next Visit Plan STM to biceps/ant deloid/pecs; Continue shoulder strengthening; progress HEP per surgical protocol.  Surgery date 12/19/20, phase 3 to 02/13/21.     PT Home Exercise Plan Access Code: [email or web address]    [email or web address] and  Agree with Plan of Care Patient             Patient will benefit from skilled therapeutic intervention in order to improve the following deficits and impairments:  Decreased endurance, Decreased skin integrity, Increased muscle spasms, Decreased range of motion, Decreased scar mobility, Decreased activity tolerance, Decreased strength, Increased fascial restricitons, Impaired flexibility, Impaired UE functional use, Pain  Visit Diagnosis: Acute pain of right shoulder  Stiffness of right shoulder, not elsewhere classified  Muscle weakness (generalized)     Problem List Patient Active Problem List   Diagnosis Date Noted   CAD (coronary artery disease) 01/07/2021   Chronic right shoulder pain 07/17/2020   Left tennis elbow 07/17/2020   Carpal tunnel syndrome on both sides 07/17/2020   Atypical chest pain 10/18/2019   Hemoptysis 10/18/2012   Preventative health care 04/07/2011   NUMBNESS, HAND 12/27/2007   HYPERLIPIDEMIA 12/24/2006   ALLERGIC RHINITIS 12/24/2006   GERD 12/24/2006   TB SKIN TEST, POSITIVE 12/24/2006    Rennie Natter, PT, DPT  03/26/2021, 11:25 AM  [facility] [address]  [place], [place], [address] Phone: [phone]   Fax:  [phone]  Name: Cristian Fuller MRN: [id number] Date of Birth: Jun 11, 1960

## 2021-03-29 ENCOUNTER — Ambulatory Visit: Payer: Medicaid Other

## 2021-03-29 ENCOUNTER — Other Ambulatory Visit: Payer: Self-pay

## 2021-03-29 DIAGNOSIS — M6281 Muscle weakness (generalized): Secondary | ICD-10-CM

## 2021-03-29 DIAGNOSIS — R293 Abnormal posture: Secondary | ICD-10-CM

## 2021-03-29 DIAGNOSIS — M25611 Stiffness of right shoulder, not elsewhere classified: Secondary | ICD-10-CM

## 2021-03-29 DIAGNOSIS — M25511 Pain in right shoulder: Secondary | ICD-10-CM | POA: Diagnosis not present

## 2021-03-29 NOTE — Therapy (Signed)
[facility] [address]  [place], [place], [address] Phone: [phone]   Fax:  [phone]  Physical Therapy Treatment  Patient Details  Name: Cristian Fuller MRN: [id number] Date of Birth: Mar 18, 1960 Referring Provider (PT): Ophelia Charter   Encounter Date: 03/29/2021   PT End of Session - 03/29/21 0846     Visit Number 26    Number of Visits 27    Date for PT Re-Evaluation 04/02/21    Authorization Type [employer]    PT Start Time 2122    PT Stop Time 0842    PT Time Calculation (min) 43 min    Activity Tolerance Patient tolerated treatment well;Patient limited by pain    Behavior During Therapy [facility] for tasks assessed/performed             Past Medical History:  Diagnosis Date   Allergic rhinitis    Hyperlipidemia    Left varicocele    history of left varicocele    Past Surgical History:  Procedure Laterality Date   BACK SURGERY     ESOPHAGOGASTRODUODENOSCOPY  2002/2007   LEFT HEART CATH AND CORONARY ANGIOGRAPHY N/A 10/24/2019   Procedure: LEFT HEART CATH AND CORONARY ANGIOGRAPHY;  Surgeon: Lorretta Harp, MD;  Location: [facility];  Service: Cardiovascular;  Laterality: N/A;   LEFT HEART CATH AND CORONARY ANGIOGRAPHY N/A 01/07/2021   Procedure: LEFT HEART CATH AND CORONARY ANGIOGRAPHY;  Surgeon: Lorretta Harp, MD;  Location: [facility];  Service: Cardiovascular;  Laterality: N/A;   SHOULDER ARTHROSCOPY WITH SUBACROMIAL DECOMPRESSION AND BICEP TENDON REPAIR Right 12/19/2020   Procedure: SHOULDER ARTHROSCOPY WITH SUBACROMIAL DECOMPRESSION AND BICEP TENDON REPAIR AND DISTAL CLAVICLE EXCISION;  Surgeon: Hiram Gash, MD;  Location: [facility];  Service: Orthopedics;  Laterality: Right;    There were no vitals filed for this visit.   Subjective Assessment - 03/29/21 0800     Subjective Pt reports no new changes within his shoulder.    Currently in Pain?  Yes    Pain Score 1     Pain Location Shoulder    Pain Orientation Right    Pain Descriptors / Indicators Aching;Sore    Pain Type Surgical pain                               [facility] Adult PT Treatment/Exercise - 03/29/21 0001       Shoulder Exercises: Sidelying   ABduction Strengthening;Right;20 reps    ABduction Limitations in 90 deg of flexion (lateral raise)      Shoulder Exercises: Standing   Diagonals Strengthening;Both;2x10 reps;Theraband    Theraband Level (Shoulder Diagonals) Level 2 (Red)    Other Standing Exercises resisted scaption with 2lb weights 2x10      Shoulder Exercises: ROM/Strengthening   UBE (Upper Arm Bike) L4.0 44min fwd/back      Manual Therapy   Manual Therapy Soft tissue mobilization;Myofascial release;Joint mobilization    Joint Mobilization GH joint inf mobs grade II-III    Soft tissue mobilization STM to R biceps    Myofascial Release TPR to R biceps                       PT Short Term Goals - 01/24/21 0846       PT SHORT TERM GOAL #1   Title Patient to be independent with initial HEP.  Status Achieved      PT SHORT TERM GOAL #2   Title Patient will demonstrate improved R shoulder ROM to 140 deg foward flexion without pain.    Status Achieved               PT Long Term Goals - 03/29/21 0809       PT LONG TERM GOAL #1   Title Patient to be independent with advanced HEP to progress to gym based exercise program.    Status Achieved   03/19/21- met for current     PT LONG TERM GOAL #2   Title Patient to demonstrate R UE strength >/=4+/5  without pain to be able to start gentle resistance training    Status Partially Met   03/19/21- 4/5 R shoulder abduction, all other 4+/5   Target Date 04/02/21      PT LONG TERM GOAL #3   Title Patient to demonstrate R shoulder AROM WFL and without pain limiting.    Status Partially Met   03/19/21- AROM WNL but still has pain at end-range abduction and IR   Target  Date 04/02/21      PT LONG TERM GOAL #4   Title Patient will report >48 on FOTO to demonstrate improved function of R shoulder    Status Achieved   03/19/21- 61%     PT LONG TERM GOAL #5   Title Pt. will be able to lift 10lbs to overhead shelf with both hands without increased R shoulder pain.    Time 2    Period Weeks    Status Achieved   03/29/21   Target Date 04/02/21      PT LONG TERM GOAL #6   Title Patient will be able to pick up 25lb weight and place on counter without increased R shoulder pain.    Time 2    Period Weeks    Status Partially Met   able to place 25lb on counter but with 2/10 R shoulder pain   Target Date 04/02/21                   Plan - 03/29/21 0846     Clinical Impression Statement Pt was able to lift 10lb OH with B UE w/o increased shoulder pain, this LTG is met. Placing 25 lb on the counter increased shoulder pain a little bit. Still showed fatigue and soreness with exercises today but feels like he can wrap up next session. HEP is still challenging per pt. Pt is still making progress.    Personal Factors and Comorbidities Comorbidity 2;Social Background    Comorbidities history chronic L shoulder pain, history of heart disease    PT Frequency 2x / week    PT Duration 12 weeks    PT Treatment/Interventions ADLs/Self Care Home Management;Cryotherapy;Electrical Stimulation;Moist Heat;Therapeutic activities;Therapeutic exercise;Neuromuscular re-education;Patient/family education;Scar mobilization;Passive range of motion;Dry needling;Taping;Vasopneumatic Device;Joint Manipulations    PT Next Visit Plan STM to biceps/ant deloid/pecs; Continue shoulder strengthening; progress HEP per surgical protocol.  Surgery date 12/19/20, phase 3 to 02/13/21.    PT Home Exercise Plan Access Code: [email or web address]    Consulted and Agree with Plan of Care Patient             Patient will benefit from skilled therapeutic intervention in order to improve the following  deficits and impairments:  Decreased endurance, Decreased skin integrity, Increased muscle spasms, Decreased range of motion, Decreased scar mobility, Decreased activity tolerance, Decreased strength, Increased fascial restricitons, Impaired flexibility, Impaired  UE functional use, Pain  Visit Diagnosis: Acute pain of right shoulder  Stiffness of right shoulder, not elsewhere classified  Muscle weakness (generalized)  Abnormal posture     Problem List Patient Active Problem List   Diagnosis Date Noted   CAD (coronary artery disease) 01/07/2021   Chronic right shoulder pain 07/17/2020   Left tennis elbow 07/17/2020   Carpal tunnel syndrome on both sides 07/17/2020   Atypical chest pain 10/18/2019   Hemoptysis 10/18/2012   Preventative health care 04/07/2011   NUMBNESS, HAND 12/27/2007   HYPERLIPIDEMIA 12/24/2006   ALLERGIC RHINITIS 12/24/2006   GERD 12/24/2006   TB SKIN TEST, POSITIVE 12/24/2006    Artist Pais, PTA 03/29/2021, 9:55 AM  [facility] [address]  [place], [place], [address] Phone: [phone]   Fax:  [phone]  Name: Cristian Fuller MRN: [id number] Date of Birth: [birth year]

## 2021-04-01 ENCOUNTER — Ambulatory Visit: Payer: Medicaid Other | Admitting: Physical Therapy

## 2021-04-04 ENCOUNTER — Encounter: Payer: Self-pay | Admitting: Gastroenterology

## 2022-12-11 IMAGING — MR MR SHOULDER*R* W/CM
6 series · 40 of 40 positions shown · IV contrast (agent unspecified)
Comparison: None.

CLINICAL DATA: Right shoulder pain for 4 months

EXAM:
MR ARTHROGRAM OF THE RIGHT SHOULDER
TECHNIQUE: Multiplanar, multisequence MR imaging of the right shoulder was
performed following the administration of intra-articular contrast.
CONTRAST:  See Injection Documentation.

[Series 3: T1 fat-sat · axial · 4.0mm · 0.55mm/px · z∈[-49,+50]mm · 7 of 23 slices shown (1 of 4)]
[im 1/23]
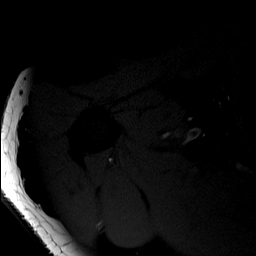
[im 4/23]
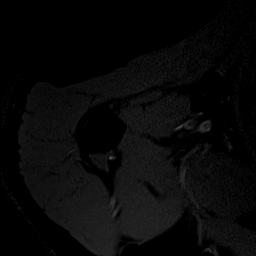
[im 8/23]
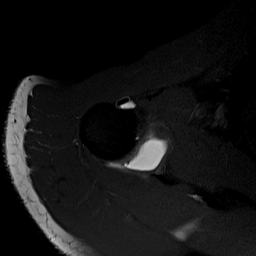
[im 12/23]
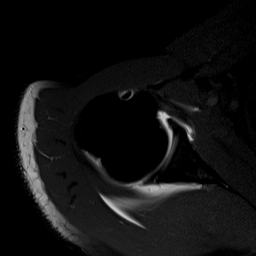
[im 15/23]
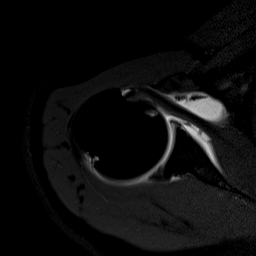
[im 19/23]
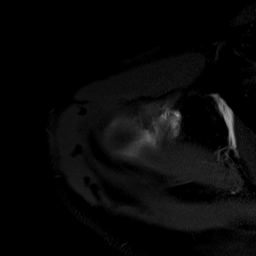
[im 23/23]
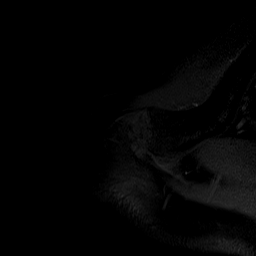

[Series 4: T1 fat-sat · oblique · 4.0mm · 0.55mm/px · 7 of 20 slices shown (2 of 4)]
[im 1/20]
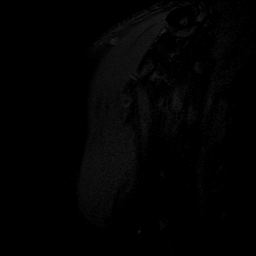
[im 4/20]
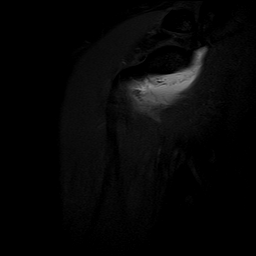
[im 7/20]
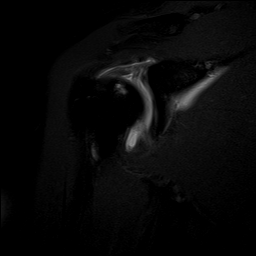
[im 10/20]
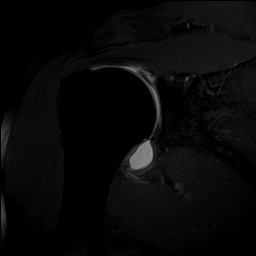
[im 13/20]
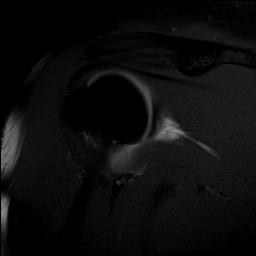
[im 16/20]
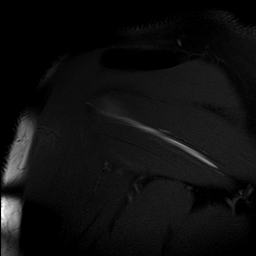
[im 20/20]
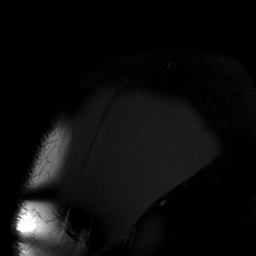

[Series 5: T2 fat-sat · oblique · 4.0mm · 0.55mm/px · 7 of 20 slices shown (1 of 2)]
[im 1/20]
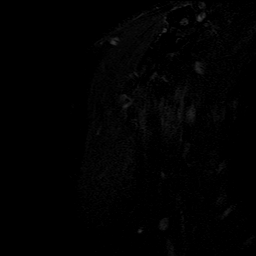
[im 4/20]
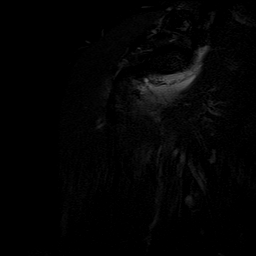
[im 7/20]
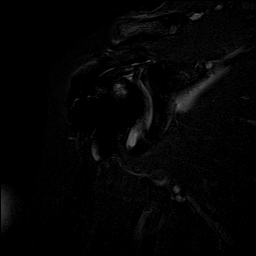
[im 10/20]
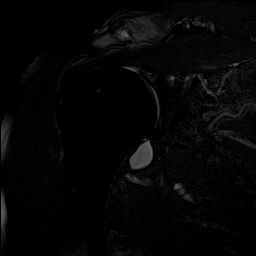
[im 13/20]
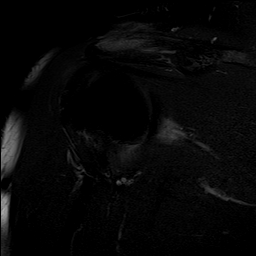
[im 16/20]
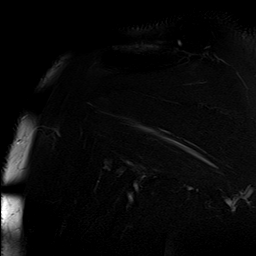
[im 20/20]
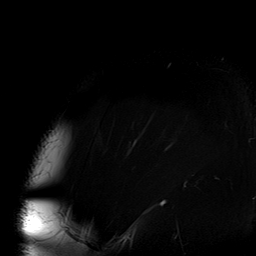

[Series 6: T1 fat-sat · oblique · non-contrast · 4.0mm · 0.44mm/px · 7 of 20 slices shown (3 of 4)]
[im 1/20]
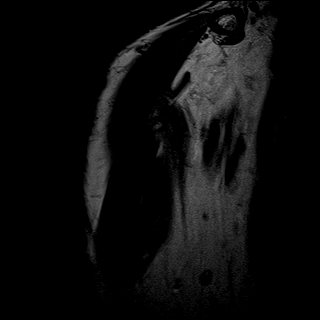
[im 4/20]
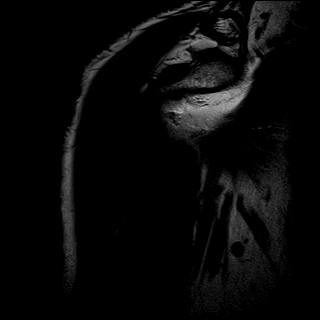
[im 7/20]
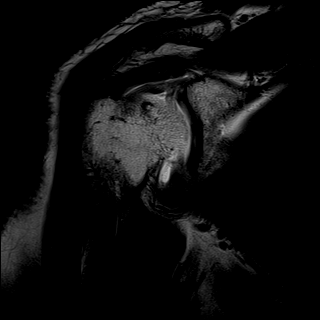
[im 10/20]
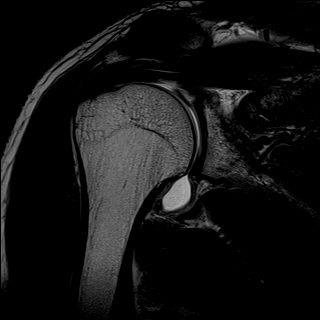
[im 13/20]
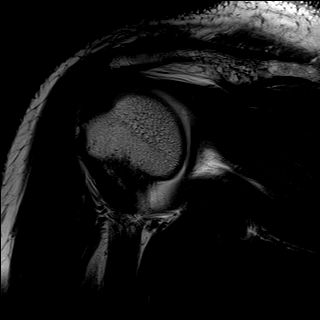
[im 16/20]
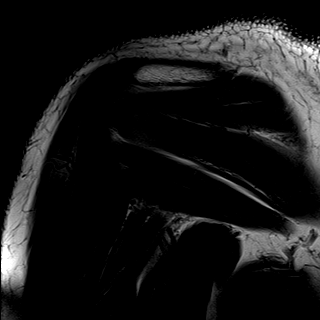
[im 20/20]
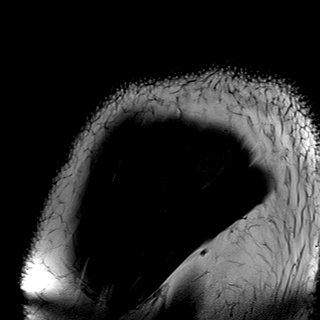

[Series 7: T2 fat-sat · oblique · 4.0mm · 0.55mm/px · 6 of 18 slices shown (2 of 2)]
[im 1/18]
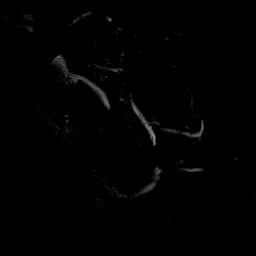
[im 4/18]
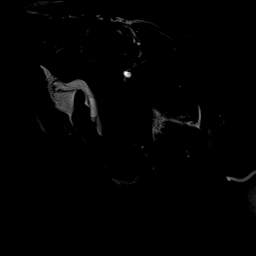
[im 7/18]
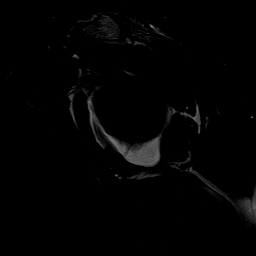
[im 11/18]
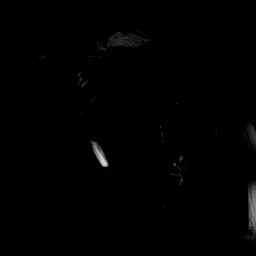
[im 14/18]
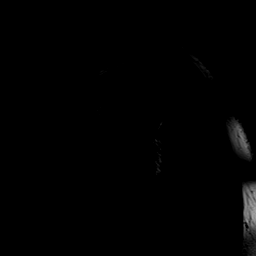
[im 18/18]
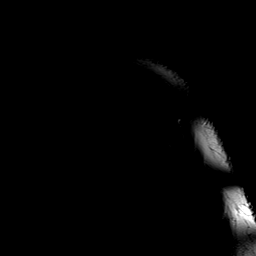

[Series 9: T1 fat-sat · sagittal · 4.0mm · 0.59mm/px · 6 of 17 slices shown (4 of 4)]
[im 1/17]
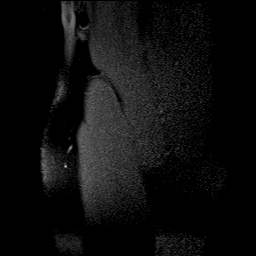
[im 4/17]
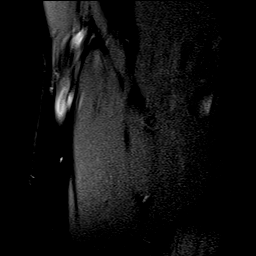
[im 7/17]
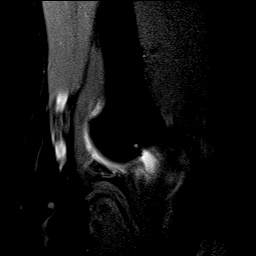
[im 10/17]
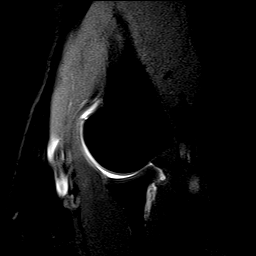
[im 13/17]
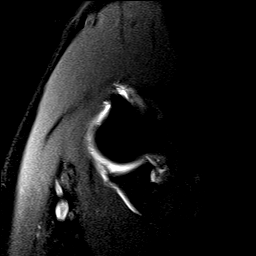
[im 17/17]
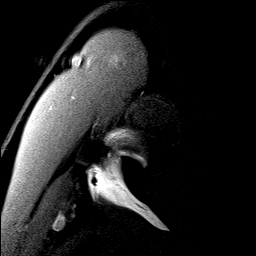

[40 of 40 positions shown; findings below may reference images not displayed]

FINDINGS: Rotator cuff: Mild tendinosis of the supraspinatus tendon with
fraying along the anterior bursal surface. Infraspinatus tendon is
intact. Teres minor tendon is intact. Subscapularis tendon is
intact.

Muscles: No muscle atrophy or edema. No intramuscular fluid
collection or hematoma.

Biceps Long Head: Intraarticular and extraarticular portions of the
biceps tendon are intact.

Acromioclavicular Joint: Severe arthropathy of the acromioclavicular
joint with bone marrow edema on either side of the joint. No
subacromial/subdeltoid bursal fluid.

Glenohumeral Joint: Intraarticular contrast distending the joint
capsule. Normal glenohumeral ligaments. No chondral defect.

Labrum: Extensive superior labral tear from anterior to posterior
with a 9 mm posterosuperior paralabral cyst. Small posteroinferior
labral tear.

Bones: No fracture or dislocation. No aggressive osseous lesion.

Other: No fluid collection or hematoma.
IMPRESSION: 1. Extensive superior labral tear from anterior to posterior with a
9 mm posterosuperior paralabral cyst.
2. Mild tendinosis of the supraspinatus tendon with fraying along
the anterior bursal surface.
3. Severe arthropathy of the acromioclavicular joint with bone
marrow edema on either side of the joint.

## 2023-01-06 IMAGING — CR DG CHEST 2V
2 series · 2 of 2 positions shown · non-contrast
Comparison: October 2012

CLINICAL DATA: Left-sided chest pain

EXAM:
CHEST - 2 VIEW

[w chest lat]
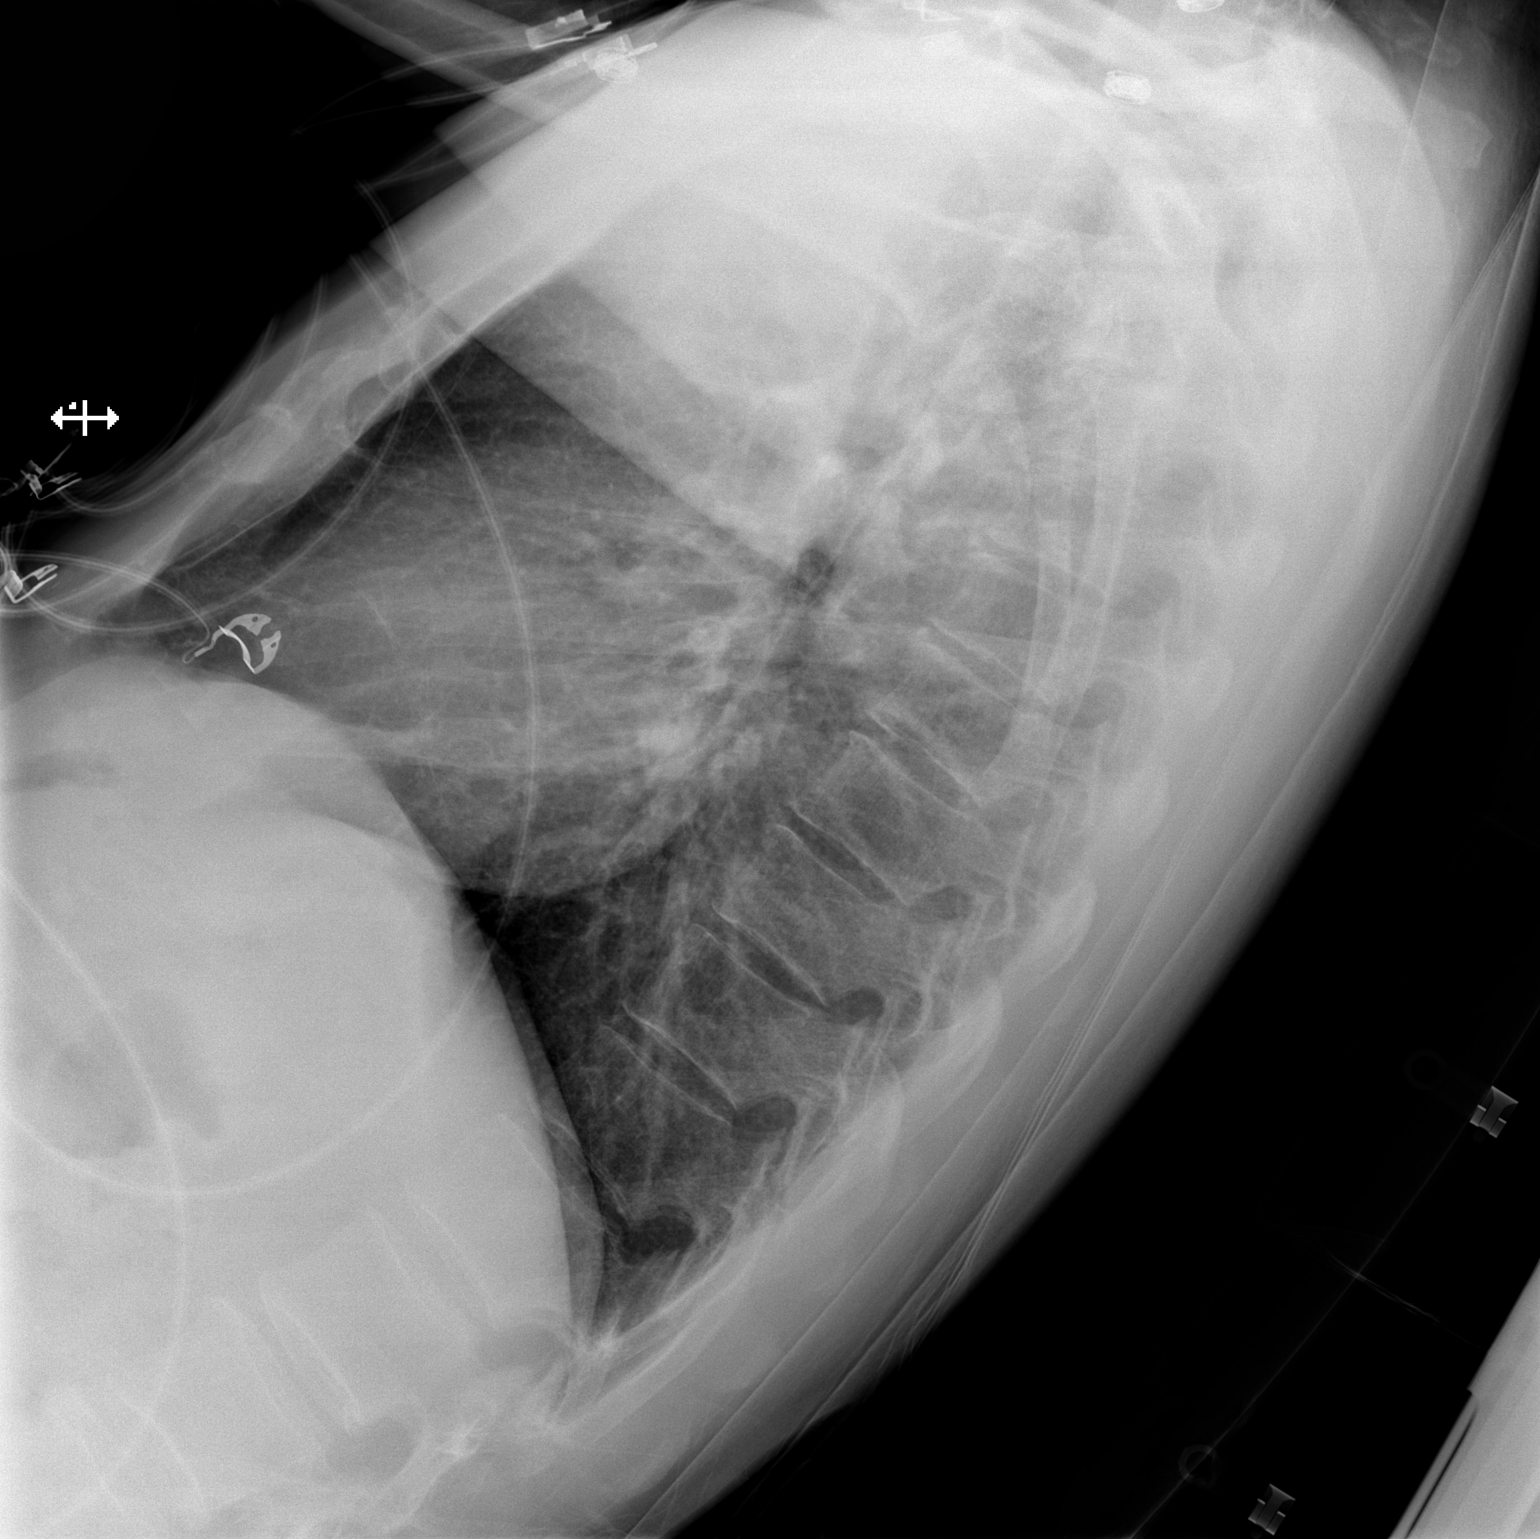

[x chest ap]
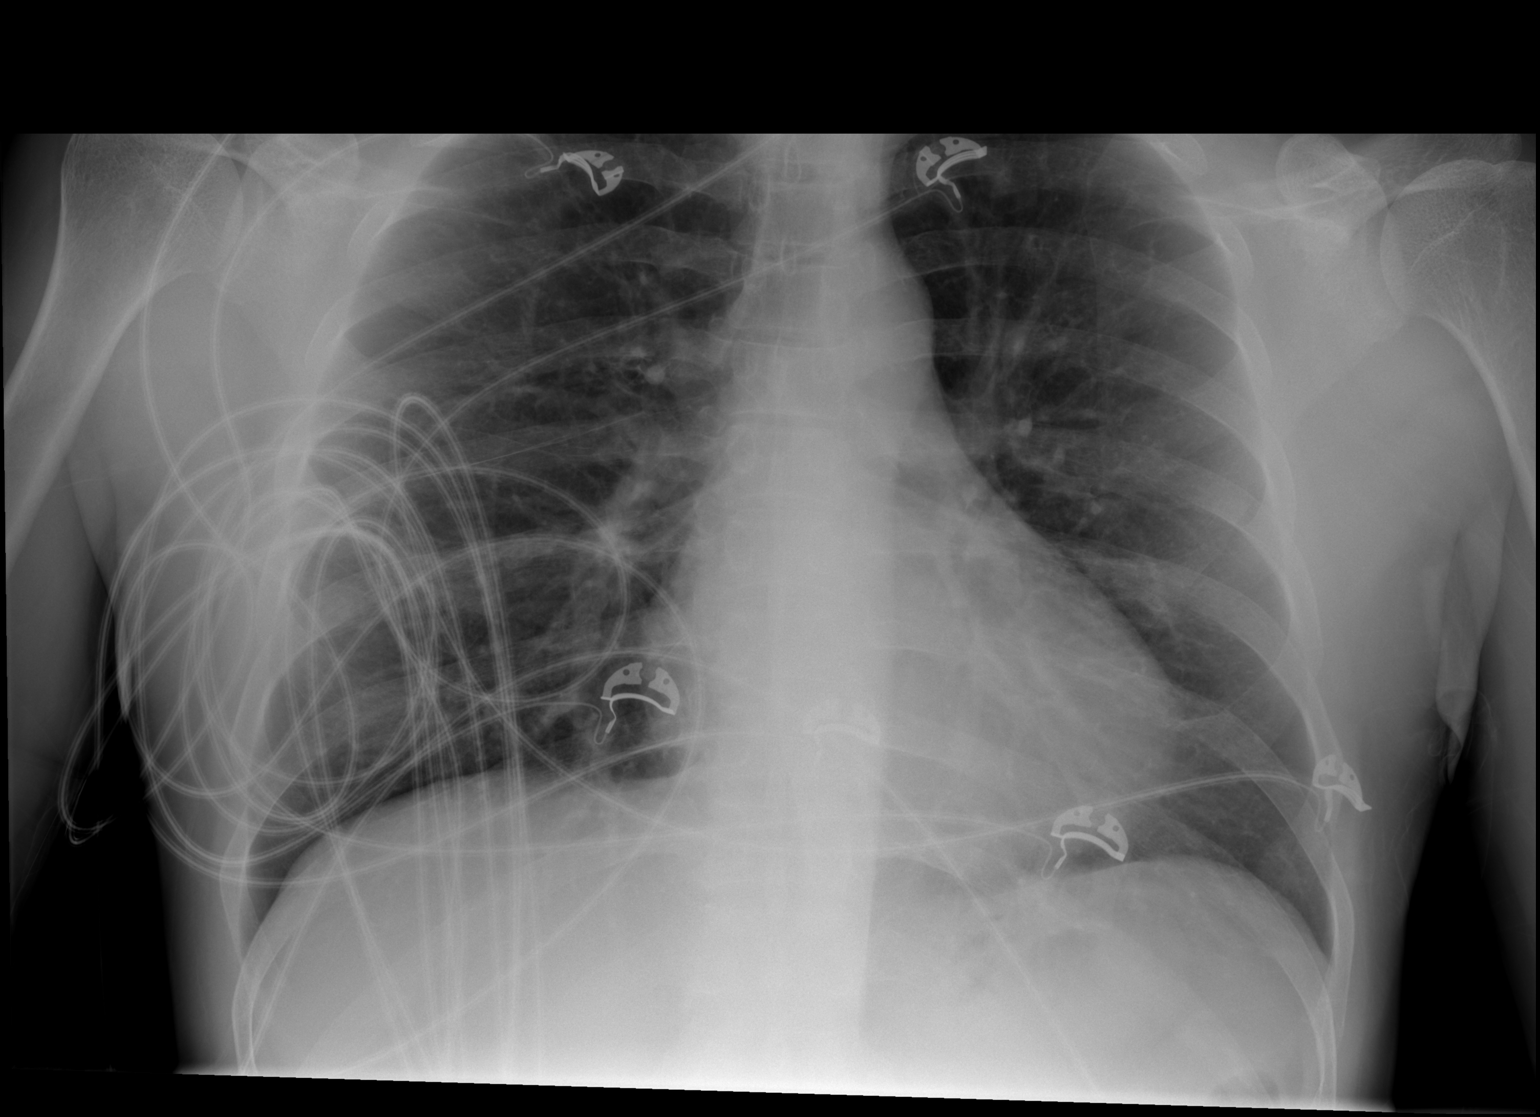

[2 of 2 positions shown; findings below may reference images not displayed]

FINDINGS: Multiple external wires overlying right lower lung. No new
consolidation or edema. Scarring mid left lung. The heart size and
mediastinal contours are within normal limits. No pleural effusion
or pneumothorax. The visualized skeletal structures are
unremarkable.
IMPRESSION: No acute process in the chest.

## 2023-10-06 ENCOUNTER — Encounter: Payer: Self-pay | Admitting: Sports Medicine
